# Patient Record
Sex: Female | Born: 1975
Health system: Southern US, Community
[De-identification: ages and names within clinical notes are randomized; demographics above are authoritative.]

## PROBLEM LIST (undated history)

## (undated) DIAGNOSIS — G43101 Migraine with aura, not intractable, with status migrainosus: Secondary | ICD-10-CM

## (undated) DIAGNOSIS — D500 Iron deficiency anemia secondary to blood loss (chronic): Secondary | ICD-10-CM

## (undated) DIAGNOSIS — D259 Leiomyoma of uterus, unspecified: Secondary | ICD-10-CM

## (undated) DIAGNOSIS — Z973 Presence of spectacles and contact lenses: Secondary | ICD-10-CM

## (undated) DIAGNOSIS — J302 Other seasonal allergic rhinitis: Secondary | ICD-10-CM

## (undated) DIAGNOSIS — D649 Anemia, unspecified: Secondary | ICD-10-CM

## (undated) DIAGNOSIS — J3089 Other allergic rhinitis: Secondary | ICD-10-CM

## (undated) DIAGNOSIS — F32A Depression, unspecified: Secondary | ICD-10-CM

## (undated) DIAGNOSIS — R519 Headache, unspecified: Secondary | ICD-10-CM

## (undated) DIAGNOSIS — Z972 Presence of dental prosthetic device (complete) (partial): Secondary | ICD-10-CM

## (undated) DIAGNOSIS — F411 Generalized anxiety disorder: Secondary | ICD-10-CM

## (undated) DIAGNOSIS — Z8669 Personal history of other diseases of the nervous system and sense organs: Secondary | ICD-10-CM

## (undated) DIAGNOSIS — F419 Anxiety disorder, unspecified: Secondary | ICD-10-CM

## (undated) DIAGNOSIS — F329 Major depressive disorder, single episode, unspecified: Secondary | ICD-10-CM

## (undated) DIAGNOSIS — R9389 Abnormal findings on diagnostic imaging of other specified body structures: Secondary | ICD-10-CM

## (undated) DIAGNOSIS — R7303 Prediabetes: Secondary | ICD-10-CM

## (undated) DIAGNOSIS — N920 Excessive and frequent menstruation with regular cycle: Secondary | ICD-10-CM

## (undated) DIAGNOSIS — C50919 Malignant neoplasm of unspecified site of unspecified female breast: Secondary | ICD-10-CM

## (undated) HISTORY — DX: Malignant neoplasm of unspecified site of unspecified female breast: C50.919

## (undated) HISTORY — PX: TUBAL LIGATION: SHX77

---

## 2001-04-01 HISTORY — PX: TUBAL LIGATION: SHX77

## 2012-09-08 ENCOUNTER — Emergency Department (HOSPITAL_COMMUNITY)
Admission: EM | Admit: 2012-09-08 | Discharge: 2012-09-08 | Discharge status: Against Medical Advice | Payer: Medicaid Other | Attending: Emergency Medicine | Admitting: Emergency Medicine

## 2012-09-08 ENCOUNTER — Encounter (HOSPITAL_COMMUNITY): Payer: Self-pay | Admitting: Emergency Medicine

## 2012-09-08 DIAGNOSIS — Z5329 Procedure and treatment not carried out because of patient's decision for other reasons: Secondary | ICD-10-CM

## 2012-09-08 DIAGNOSIS — J029 Acute pharyngitis, unspecified: Secondary | ICD-10-CM | POA: Insufficient documentation

## 2012-09-08 DIAGNOSIS — R51 Headache: Secondary | ICD-10-CM | POA: Insufficient documentation

## 2012-09-08 NOTE — ED Notes (Signed)
Pt c/o sore throat and sinus drainage x 4 days

## 2012-09-08 NOTE — ED Provider Notes (Signed)
VSS stable in triage. Patient left before being seen or evaluated by myself.  Antony Madura, PA-C 09/08/12 1254

## 2012-09-08 NOTE — ED Provider Notes (Signed)
Medical screening examination/treatment/procedure(s) were performed by non-physician practitioner and as supervising physician I was immediately available for consultation/collaboration.   Carleene Cooper III, MD 09/08/12 [phone]

## 2012-09-08 NOTE — ED Notes (Signed)
Pt left before being seen by provider. Pt advised that she is leaving AMA.

## 2013-03-12 ENCOUNTER — Emergency Department (HOSPITAL_COMMUNITY): Payer: Medicaid Other

## 2013-03-12 ENCOUNTER — Emergency Department (HOSPITAL_COMMUNITY)
Admission: EM | Admit: 2013-03-12 | Discharge: 2013-03-12 | Disposition: A | Discharge status: Home / Self Care | Payer: Medicaid Other | Attending: Emergency Medicine | Admitting: Emergency Medicine

## 2013-03-12 DIAGNOSIS — K089 Disorder of teeth and supporting structures, unspecified: Secondary | ICD-10-CM | POA: Insufficient documentation

## 2013-03-12 DIAGNOSIS — K0889 Other specified disorders of teeth and supporting structures: Secondary | ICD-10-CM

## 2013-03-12 DIAGNOSIS — K0381 Cracked tooth: Secondary | ICD-10-CM | POA: Insufficient documentation

## 2013-03-12 DIAGNOSIS — R6883 Chills (without fever): Secondary | ICD-10-CM | POA: Insufficient documentation

## 2013-03-12 DIAGNOSIS — J019 Acute sinusitis, unspecified: Secondary | ICD-10-CM | POA: Insufficient documentation

## 2013-03-12 DIAGNOSIS — R220 Localized swelling, mass and lump, head: Secondary | ICD-10-CM | POA: Insufficient documentation

## 2013-03-12 DIAGNOSIS — K047 Periapical abscess without sinus: Secondary | ICD-10-CM

## 2013-03-12 IMAGING — CT CT MAXILLOFACIAL W/O CM
3 series · 16 of 47 positions shown, 19 images · non-contrast
Comparison: None.

CLINICAL DATA: Sinus congestion/pressure, toothache

EXAM:
CT MAXILLOFACIAL WITHOUT CONTRAST
TECHNIQUE: Multidetector CT imaging of the maxillofacial structures was
performed. Multiplanar CT image reconstructions were also generated.
A small metallic BB was placed on the right temple in order to
reliably differentiate right from left.

[Series 3: facial/ orbits 2.0 h30s · axial · 0.36mm/px · z∈[-155,-23]mm · 10 of 78 slices shown, 13 images]
[im 6/78  brain]
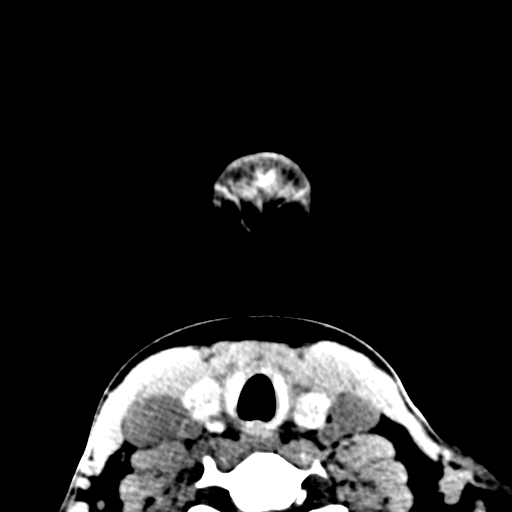
[im 6/78  bone]
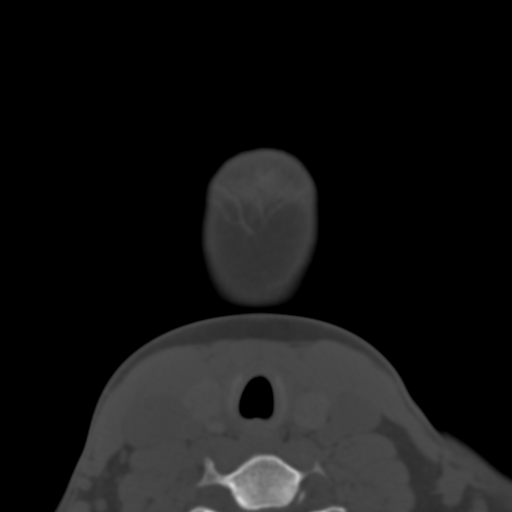
[im 14/78  bone]
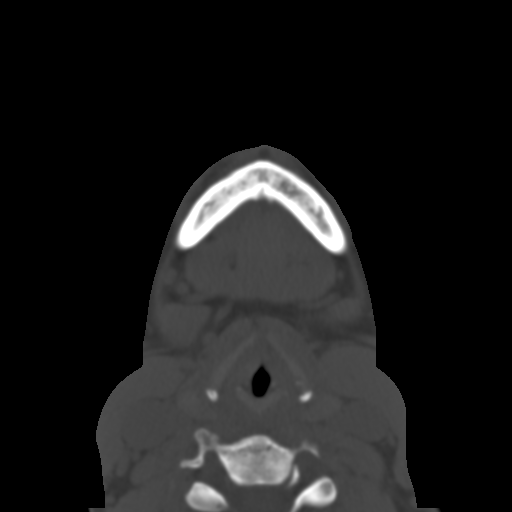
[im 22/78  bone]
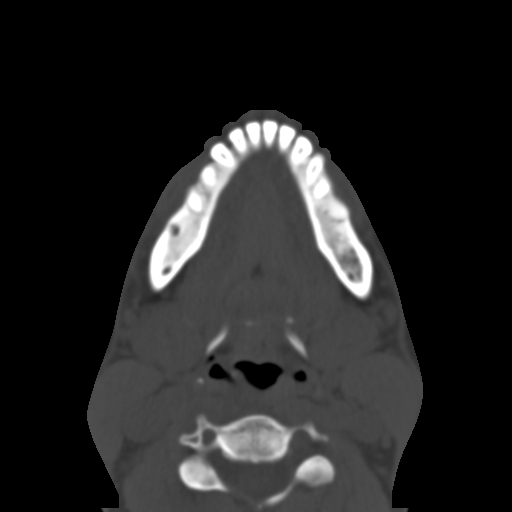
[im 27/78  bone]
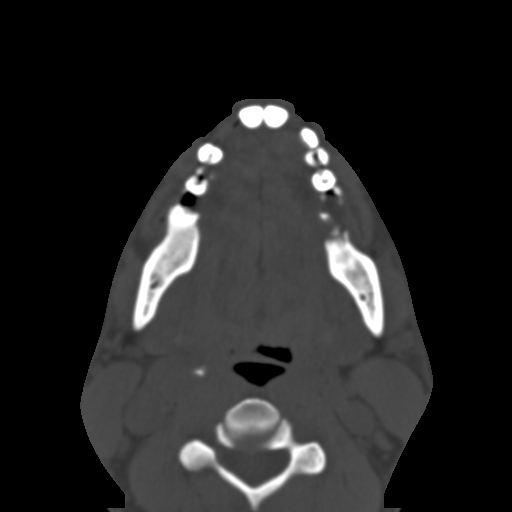
[im 35/78  brain]
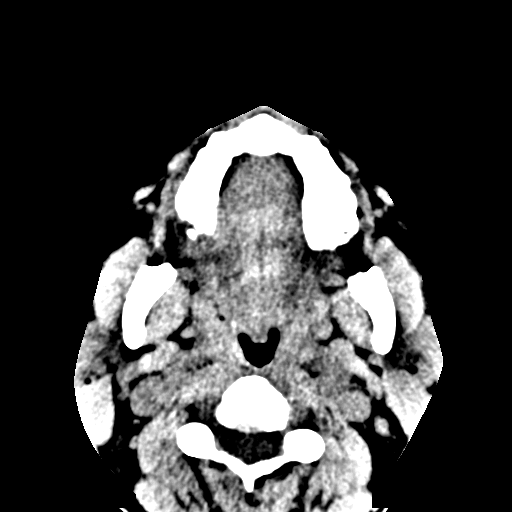
[im 35/78  bone]
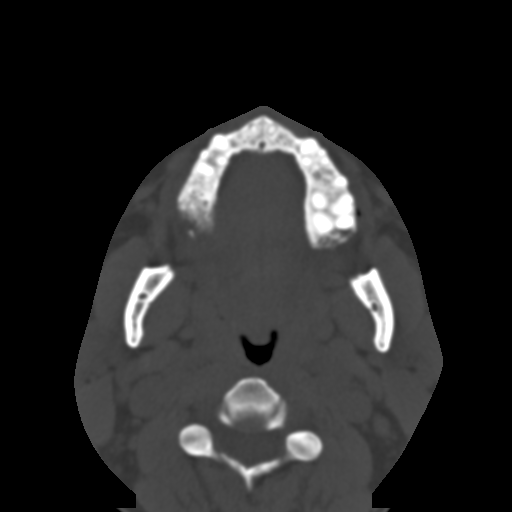
[im 43/78  bone]
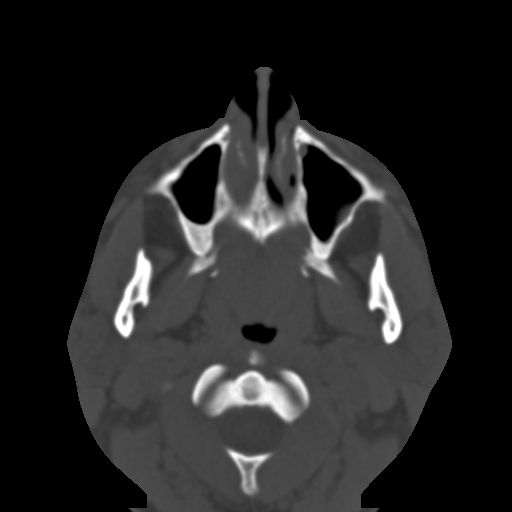
[im 51/78  bone]
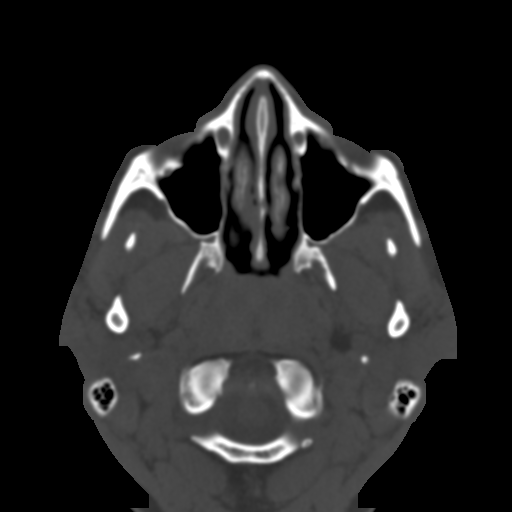
[im 59/78  bone]
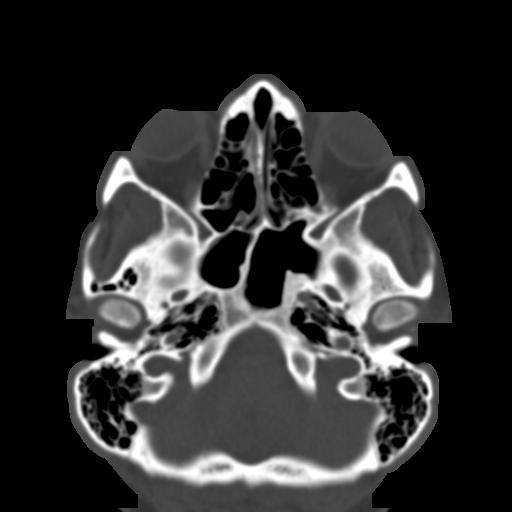
[im 64/78  brain]
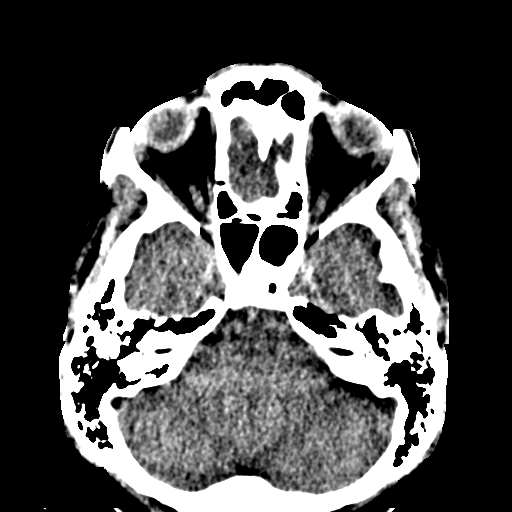
[im 64/78  bone]
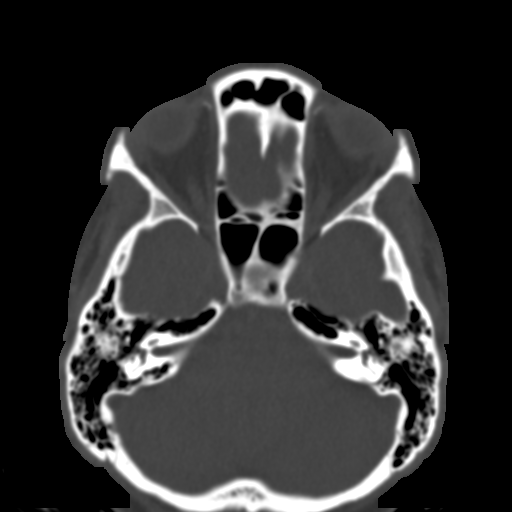
[im 72/78  bone]
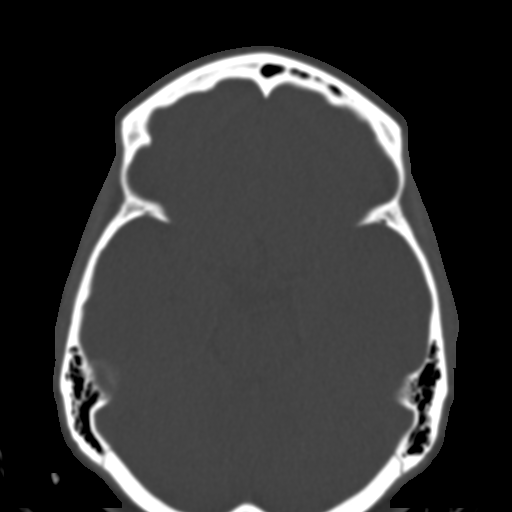

[Series 602: st cor · coronal · 0.36mm/px · 3 of 81 slices shown]
[im 27/81  bone]
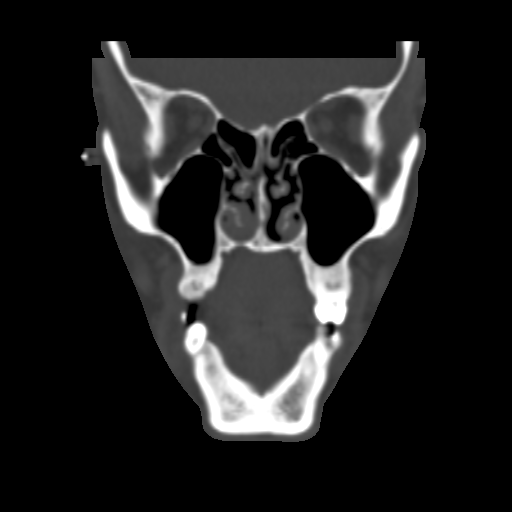
[im 36/81  bone]
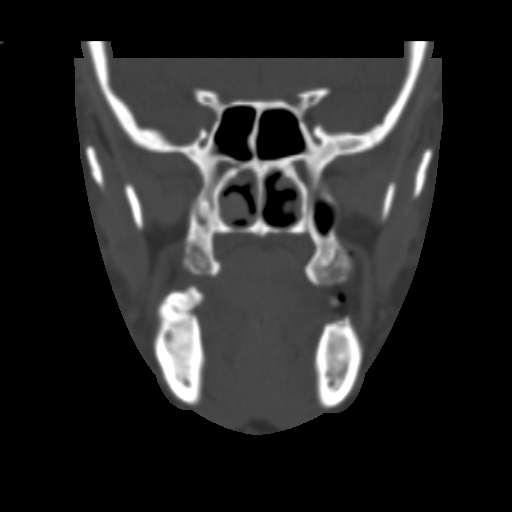
[im 45/81  bone]
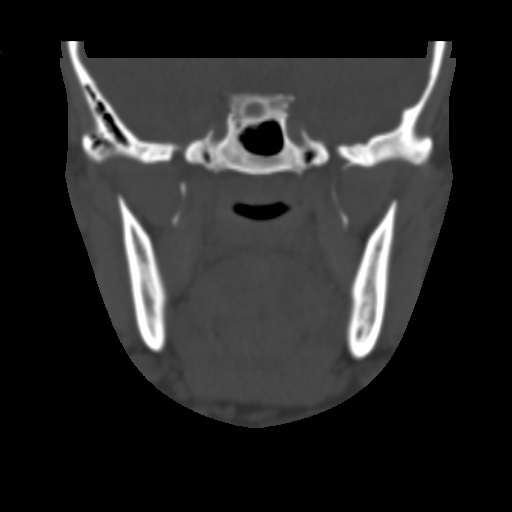

[Series 603: st sag · sagittal · 0.36mm/px · 3 of 81 slices shown]
[im 27/81  bone]
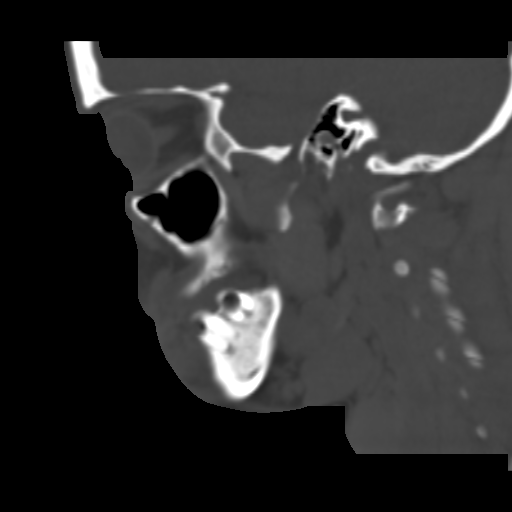
[im 41/81  bone]
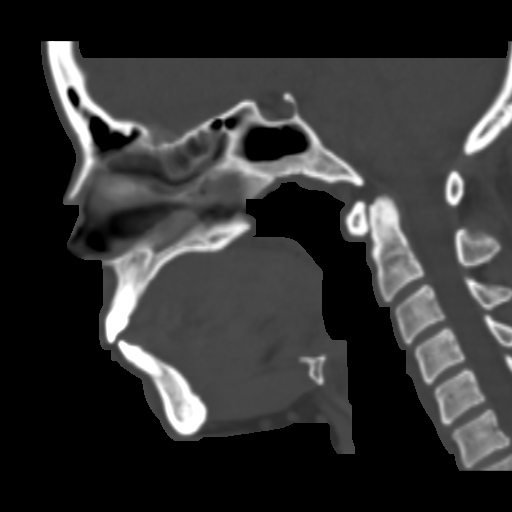
[im 54/81  bone]
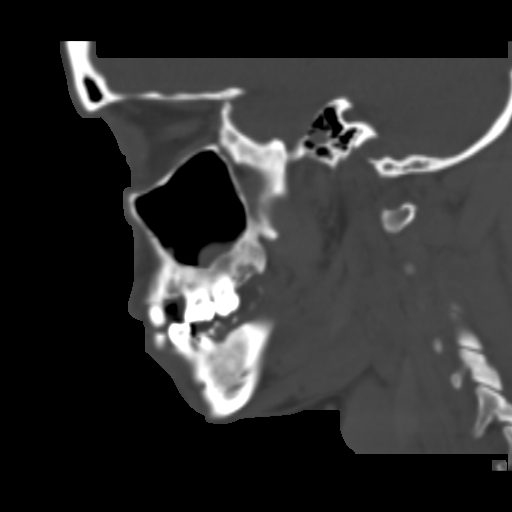

[16 of 47 positions shown; findings below may reference images not displayed]

FINDINGS: Poor dentition.

Multiple prior tooth extractions, including involving the upper
molars bilaterally (series 4/image 37). Numerous dental caries,
including a dominant caries involving the right lower 2nd molar
(series 4/ image 31). Multiple apical lucencies, including involving
the right lower 1st bicuspid and right lower 2nd molar.

No evidence of odontogenic abscess.

Mild mucosal thickening in the left maxillary sinus. Visualized
paranasal sinuses and mastoid air cells are otherwise clear.

Bilateral orbits, including the globes and retroconal soft tissues,
are within normal limits.

Multiple small bilateral cervical lymph nodes measuring up to 8 mm
short axis (series 3/ image 31), likely reactive.

Visualized brain parenchyma is unremarkable.

Visualized cervical spine is normal to C6-7.
IMPRESSION: No evidence of odontogenic abscess.

Poor dentition with multiple prior extractions, numerous dental
caries, and multiple apical lucencies, as described above. Dominant
caries involves the right lower 2nd molar.

## 2013-03-12 MED ORDER — AMOXICILLIN-POT CLAVULANATE 875-125 MG PO TABS: 1.0000 | ORAL_TABLET | Freq: Two times a day (BID) | ORAL | Status: DC

## 2013-03-12 MED ORDER — KETOROLAC TROMETHAMINE 60 MG/2ML IM SOLN
60.0000 mg | Freq: Once | INTRAMUSCULAR | Status: AC
  Administered 2013-03-12: 60 mg via INTRAMUSCULAR
  Filled 2013-03-12: qty 2

## 2013-03-12 MED ORDER — OXYCODONE-ACETAMINOPHEN 5-325 MG PO TABS: 1.0000 | ORAL_TABLET | Freq: Three times a day (TID) | ORAL | Status: DC | PRN

## 2013-03-12 NOTE — ED Provider Notes (Signed)
CSN: [id number]     Arrival date & time 03/12/13  1512 History  This chart was scribed for non-physician practitioner, Raymon Mutton, PA-C,working with Flint Melter, MD, by Karle Plumber, ED Scribe.  This patient was seen in room TR08C/TR08C and the patient's care was started at 4:36 PM.  Chief Complaint  Patient presents with  . URI   The history is provided by the patient. No language interpreter was used.   HPI Comments:  Connie Underwood is a 37 y.o. female who presents to the Emergency Department complaining of sinus congestion and pressure for approximately 5 days. Pt reports an associated tooth ache onset last night. Pt states the sinus congestion and pressure are predominately on the right side. She reports a postnasal drip that is causing a sore throat. She states her right upper molar started hurting last night secondary to cracking the tooth previously. She states she needs an extraction but cannot be seen by her dentist for two weeks. She reports last seeing a dentist about 7 months ago and having several top teeth extracted. She denies drainage from or around the tooth. She states she has been taking Ibuprofen and Tylenol Sinus for her symptoms. She denies epistaxis, fever, chills, chest pain, SOB, diffulty breathing, cough, neck pain or stiffness, or difficulty swallowing.   No past medical history on file. No past surgical history on file. No family history on file. History  Substance Use Topics  . Smoking status: Never Smoker   . Smokeless tobacco: Not on file  . Alcohol Use: No   OB History   Grav Para Term Preterm Abortions TAB SAB Ect Mult Living                 Review of Systems  Constitutional: Positive for chills. Negative for fever.  HENT: Positive for congestion, dental problem, postnasal drip, sinus pressure and sore throat. Negative for facial swelling, mouth sores, nosebleeds and trouble swallowing.   Respiratory: Negative for cough and shortness of  breath.   Cardiovascular: Negative for chest pain.  Musculoskeletal: Negative for neck pain and neck stiffness.  All other systems reviewed and are negative.    Allergies  Review of patient's allergies indicates no known allergies.  Home Medications   Current Outpatient Rx  Name  Route  Sig  Dispense  Refill  . ibuprofen (ADVIL,MOTRIN) 200 MG tablet   Oral   Take 400 mg by mouth every 6 (six) hours as needed for fever, headache or mild pain.         . pseudoephedrine-acetaminophen (TYLENOL SINUS) 30-500 MG TABS   Oral   Take 2 tablets by mouth every 4 (four) hours as needed.         Marland Kitchen amoxicillin-clavulanate (AUGMENTIN) 875-125 MG per tablet   Oral   Take 1 tablet by mouth 2 (two) times daily. One po bid x 7 days   14 tablet   0   . oxyCODONE-acetaminophen (PERCOCET/ROXICET) 5-325 MG per tablet   Oral   Take 1 tablet by mouth every 8 (eight) hours as needed for severe pain.   11 tablet   0    Triage Vitals: BP 113/50  Pulse 78  Temp(Src) 98.8 F (37.1 C) (Oral)  Resp 18  SpO2 100% Physical Exam  Nursing note and vitals reviewed. Constitutional: She is oriented to person, place, and time. She appears well-developed and well-nourished.  HENT:  Head: Normocephalic and atraumatic.  Right Ear: External ear normal.  Left Ear: External ear  normal.  Mouth/Throat: Oropharynx is clear and moist. No oropharyngeal exudate.  Mild facial swelling localized to the right side of the face, maxillary region with discomfort upon palpation to the right maxillary and mandibular region. Negative post-nasal drip. Negative swelling, erythema, inflammation, lesions, sores, exudate, petechiae noted to the posterior oropharynx and bilateral tonsils.  Poor dentition noted with first, second and third premolars diagrammed with decaying processes beginning. Swelling to the gumline noted with negative active drainage or bleeding. Negative trismus. Negative sublingual lesions. Uvula midline,  symmetrical elevation.   Eyes: Conjunctivae and EOM are normal. Pupils are equal, round, and reactive to light. Right eye exhibits no discharge. Left eye exhibits no discharge.  Neck: Normal range of motion. Neck supple.  Negative neck stiffness Negative nuchal rigidity Negative cervical LAD Negative meningeal signs  Cardiovascular: Normal rate, regular rhythm and normal heart sounds.  Exam reveals no friction rub.   No murmur heard. Pulses:      Radial pulses are 2+ on the right side, and 2+ on the left side.  Pulmonary/Chest: Effort normal and breath sounds normal. No respiratory distress. She has no wheezes. She has no rales.  Musculoskeletal: Normal range of motion.  Lymphadenopathy:    She has no cervical adenopathy.  Neurological: She is alert and oriented to person, place, and time. She exhibits normal muscle tone. Coordination normal.  Skin: Skin is warm and dry. No rash noted. No erythema.  Psychiatric: She has a normal mood and affect. Her behavior is normal.    ED Course  Procedures (including critical care time) DIAGNOSTIC STUDIES: Oxygen Saturation is 100% on RA, normal by my interpretation.   COORDINATION OF CARE: 4:43 PM- Will perform a CT of the head. Pt verbalizes understanding and agrees to plan.  Medications  ketorolac (TORADOL) injection 60 mg (not administered)    Ct Maxillofacial Wo Cm  03/12/2013   CLINICAL DATA:  Sinus congestion/pressure, toothache  EXAM: CT MAXILLOFACIAL WITHOUT CONTRAST  TECHNIQUE: Multidetector CT imaging of the maxillofacial structures was performed. Multiplanar CT image reconstructions were also generated. A small metallic BB was placed on the right temple in order to reliably differentiate right from left.  COMPARISON:  None.  FINDINGS: Poor dentition.  Multiple prior tooth extractions, including involving the upper molars bilaterally (series 4/image 37). Numerous dental caries, including a dominant caries involving the right lower 2nd  molar (series 4/ image 31). Multiple apical lucencies, including involving the right lower 1st bicuspid and right lower 2nd molar.  No evidence of odontogenic abscess.  Mild mucosal thickening in the left maxillary sinus. Visualized paranasal sinuses and mastoid air cells are otherwise clear.  Bilateral orbits, including the globes and retroconal soft tissues, are within normal limits.  Multiple small bilateral cervical lymph nodes measuring up to 8 mm short axis (series 3/ image 31), likely reactive.  Visualized brain parenchyma is unremarkable.  Visualized cervical spine is normal to C6-7.  IMPRESSION: No evidence of odontogenic abscess.  Poor dentition with multiple prior extractions, numerous dental caries, and multiple apical lucencies, as described above. Dominant caries involves the right lower 2nd molar.   Electronically Signed   By: Charline Bills M.D.   On: 03/12/2013 18:28   Labs Review Labs Reviewed - No data to display Imaging Review Ct Maxillofacial Wo Cm  03/12/2013   CLINICAL DATA:  Sinus congestion/pressure, toothache  EXAM: CT MAXILLOFACIAL WITHOUT CONTRAST  TECHNIQUE: Multidetector CT imaging of the maxillofacial structures was performed. Multiplanar CT image reconstructions were also generated. A small  metallic BB was placed on the right temple in order to reliably differentiate right from left.  COMPARISON:  None.  FINDINGS: Poor dentition.  Multiple prior tooth extractions, including involving the upper molars bilaterally (series 4/image 37). Numerous dental caries, including a dominant caries involving the right lower 2nd molar (series 4/ image 31). Multiple apical lucencies, including involving the right lower 1st bicuspid and right lower 2nd molar.  No evidence of odontogenic abscess.  Mild mucosal thickening in the left maxillary sinus. Visualized paranasal sinuses and mastoid air cells are otherwise clear.  Bilateral orbits, including the globes and retroconal soft tissues, are  within normal limits.  Multiple small bilateral cervical lymph nodes measuring up to 8 mm short axis (series 3/ image 31), likely reactive.  Visualized brain parenchyma is unremarkable.  Visualized cervical spine is normal to C6-7.  IMPRESSION: No evidence of odontogenic abscess.  Poor dentition with multiple prior extractions, numerous dental caries, and multiple apical lucencies, as described above. Dominant caries involves the right lower 2nd molar.   Electronically Signed   By: Charline Bills M.D.   On: 03/12/2013 18:28    EKG Interpretation   None       MDM   1. Acute sinusitis   2. Pain, dental   3. Periapical abscess    Filed Vitals:   03/12/13 1521  BP: 113/50  Pulse: 78  Temp: 98.8 F (37.1 C)  TempSrc: Oral  Resp: 18  SpO2: 100%    I personally performed the services described in this documentation, which was scribed in my presence. The recorded information has been reviewed and is accurate.  Patient presenting to emergency department with nasal congestion, sinus pressure for the past 5 days with right mandibular dental pain that has been ongoing since yesterday. Patient reports that the last time she was seen by a dentist was 7 months ago. Alert and oriented. GCS 15. Heart rate and rhythm normal. Pulses palpable and strong, radial 2+ bilaterally. Lungs clear to auscultation to upper and lower lobes bilaterally. Mild swelling localized to the right side of the maxillary and mandibular region. Discomfort upon palpation to bilateral maxillary regions. Poor dentition identified with numerous teeth to the upper and lower gumline with diagrammed teeth, in decaying process. Discomfort upon palpation. First, second, third premolars of the right mandibular region identified to be diagrammed, decaying process noted- gum line swelling noted without signs of drainage or bleeding. Negative trismus. Uvula midline, symmetrical elevation. Negative sublingual lesions identified. CT  maxillofacial identified mild mucosal thickening in the left maxillary sinus-visualized paranasal sinuses and mastoid air cells are otherwise clear. Poor dentition identified with numerous dental cavities and multiple apical lucencies.  Doubt peritonsillar abscess. Doubt Ludwig's angina. Suspicion of acute sinusitis. Possible beginnings of periapical abscesses secondary to multiple lucency identified on CT scan. Patient stable, afebrile. Patient nonseptic appearing. Will discharge patient with antibiotics and small dose of pain medications. Discussed course, cautions, disposal technique regarding pain medications. Referred patient to [facility] wellness center, dentist and oral surgeon. Dental referral guide given in ED discharge paperwork. Discussed with patient to rest and stay hydrated. Discussed with patient to closely monitor symptoms and if symptoms are to worsen or change report back to emergency apartment - strict return structures given. Patient agreed to plan of care, understood, all questions answered.  Raymon Mutton, PA-C 03/13/13 [phone]

## 2013-03-12 NOTE — ED Notes (Signed)
Pt c/o URI sx with congestion x 5 days

## 2013-03-14 NOTE — ED Provider Notes (Signed)
Medical screening examination/treatment/procedure(s) were performed by non-physician practitioner and as supervising physician I was immediately available for consultation/collaboration.  Mariyah Upshaw L Levii Hairfield, MD 03/14/13 0106 

## 2013-03-22 ENCOUNTER — Encounter (HOSPITAL_COMMUNITY): Payer: Self-pay | Admitting: Emergency Medicine

## 2013-03-22 ENCOUNTER — Emergency Department (HOSPITAL_COMMUNITY)
Admission: EM | Admit: 2013-03-22 | Discharge: 2013-03-22 | Disposition: A | Discharge status: Home / Self Care | Payer: Medicaid Other | Attending: Emergency Medicine | Admitting: Emergency Medicine

## 2013-03-22 DIAGNOSIS — Z202 Contact with and (suspected) exposure to infections with a predominantly sexual mode of transmission: Secondary | ICD-10-CM | POA: Insufficient documentation

## 2013-03-22 DIAGNOSIS — Z3202 Encounter for pregnancy test, result negative: Secondary | ICD-10-CM | POA: Insufficient documentation

## 2013-03-22 DIAGNOSIS — Z8709 Personal history of other diseases of the respiratory system: Secondary | ICD-10-CM | POA: Insufficient documentation

## 2013-03-22 LAB — WET PREP, GENITAL

## 2013-03-22 LAB — URINALYSIS, ROUTINE W REFLEX MICROSCOPIC
Bilirubin Urine: NEGATIVE
Glucose, UA: NEGATIVE mg/dL
Hgb urine dipstick: NEGATIVE
Nitrite: NEGATIVE
Protein, ur: NEGATIVE mg/dL
Urobilinogen, UA: 0.2 mg/dL (ref 0.0–1.0)

## 2013-03-22 LAB — POCT PREGNANCY, URINE: Preg Test, Ur: NEGATIVE

## 2013-03-22 MED ORDER — LIDOCAINE HCL (PF) 1 % IJ SOLN
INTRAMUSCULAR | Status: AC
  Administered 2013-03-22: 0.9 mL
  Filled 2013-03-22: qty 5

## 2013-03-22 MED ORDER — FLUCONAZOLE 150 MG PO TABS: 150.0000 mg | ORAL_TABLET | Freq: Once | ORAL | Status: DC

## 2013-03-22 MED ORDER — CEFTRIAXONE SODIUM 250 MG IJ SOLR
250.0000 mg | Freq: Once | INTRAMUSCULAR | Status: AC
  Administered 2013-03-22: 250 mg via INTRAMUSCULAR
  Filled 2013-03-22: qty 250

## 2013-03-22 MED ORDER — AZITHROMYCIN 250 MG PO TABS
1000.0000 mg | ORAL_TABLET | Freq: Once | ORAL | Status: AC
  Administered 2013-03-22: 1000 mg via ORAL
  Filled 2013-03-22: qty 4

## 2013-03-22 NOTE — ED Provider Notes (Signed)
CSN: [id number]     Arrival date & time 03/22/13  1632 History   First MD Initiated Contact with Patient 03/22/13 2003     Chief Complaint  Patient presents with  . Exposure to STD   (Consider location/radiation/quality/duration/timing/severity/associated sxs/prior Treatment) HPI  37 year old female presents for evaluations of possible STD. Patient states her boyfriend has cheated on her and the female that he has sexual relationship called her today to notify that she contracted STD.  Therefore, she would like to be checked.  She report having a sinus infection that was treated with Augmentin last week. Her sinus infection has improved but she thinks she may have developed a yeast infection due to having mild vaginal irritation. Or she denies having any fever, chills, dysuria, hematuria vaginal discharge, rash. She denies any prior history of STD. Her last menstrual period was December 13th.    History reviewed. No pertinent past medical history. History reviewed. No pertinent past surgical history. No family history on file. History  Substance Use Topics  . Smoking status: Never Smoker   . Smokeless tobacco: Not on file  . Alcohol Use: No   OB History   Grav Para Term Preterm Abortions TAB SAB Ect Mult Living                 Review of Systems  Constitutional: Negative for fever.  Gastrointestinal: Negative for abdominal pain.  Genitourinary: Negative for dysuria, hematuria, flank pain, vaginal discharge, genital sores, vaginal pain and pelvic pain.  Skin: Negative for rash.  Neurological: Negative for headaches.    Allergies  Review of patient's allergies indicates no known allergies.  Home Medications   Current Outpatient Rx  Name  Route  Sig  Dispense  Refill  . ibuprofen (ADVIL,MOTRIN) 200 MG tablet   Oral   Take 400 mg by mouth every 6 (six) hours as needed for fever, headache or mild pain.         Marland Kitchen oxyCODONE-acetaminophen (PERCOCET/ROXICET) 5-325 MG per  tablet   Oral   Take 1 tablet by mouth every 8 (eight) hours as needed for severe pain.   11 tablet   0   . pseudoephedrine-acetaminophen (TYLENOL SINUS) 30-500 MG TABS   Oral   Take 2 tablets by mouth every 4 (four) hours as needed.          BP 103/65  Pulse 65  Temp(Src) 99.3 F (37.4 C) (Oral)  Resp 20  SpO2 100%  LMP 03/13/2013 Physical Exam  Nursing note and vitals reviewed. Constitutional: She appears well-developed and well-nourished. No distress.  HENT:  Head: Normocephalic and atraumatic.  Eyes: Conjunctivae are normal.  Neck: Normal range of motion. Neck supple.  Cardiovascular: Normal rate and regular rhythm.   Pulmonary/Chest: Effort normal and breath sounds normal. She exhibits no tenderness.  Abdominal: Soft. There is no tenderness.  Genitourinary: Vagina normal and uterus normal. There is no rash or lesion on the right labia. There is no rash or lesion on the left labia. Cervix exhibits no motion tenderness and no discharge. Right adnexum displays no mass and no tenderness. Left adnexum displays no mass and no tenderness. No erythema, tenderness or bleeding around the vagina. No vaginal discharge found.  Chaperone present:  Lymphadenopathy:       Right: No inguinal adenopathy present.       Left: No inguinal adenopathy present.    ED Course  Procedures (including critical care time)  8:31 PM Pt concerns of STD.  She has no  specific complaint.  Preg test neg.  UA without UTI.    9:27 PM No discomfort with pelvic exam.  No other concerning finding on wet prep.  Pt stable for discharge.    Labs Review Labs Reviewed  WET PREP, GENITAL - Abnormal; Notable for the following:    Clue Cells Wet Prep HPF POC FEW (*)    WBC, Wet Prep HPF POC FEW (*)    All other components within normal limits  URINALYSIS, ROUTINE W REFLEX MICROSCOPIC - Abnormal; Notable for the following:    APPearance HAZY (*)    Specific Gravity, Urine 1.033 (*)    All other components  within normal limits  GC/CHLAMYDIA PROBE AMP  POCT PREGNANCY, URINE   Imaging Review No results found.  EKG Interpretation   None       MDM   1. Exposure to STD    BP 103/65  Pulse 65  Temp(Src) 99.3 F (37.4 C) (Oral)  Resp 20  SpO2 100%  LMP 03/13/2013     Fayrene Helper, PA-C 03/22/13 2128

## 2013-03-22 NOTE — ED Notes (Signed)
PA at beside.

## 2013-03-22 NOTE — ED Provider Notes (Signed)
Medical screening examination/treatment/procedure(s) were performed by non-physician practitioner and as supervising physician I was immediately available for consultation/collaboration.  Shanna Cisco, MD 03/22/13 2223

## 2013-03-22 NOTE — ED Notes (Signed)
Pt states that she was told she could have been to an STD (trich).  Pt has some vaginal irritation but is on antibiotics for sinus infection

## 2013-09-27 ENCOUNTER — Encounter (HOSPITAL_COMMUNITY): Payer: Self-pay | Admitting: Emergency Medicine

## 2013-09-27 ENCOUNTER — Emergency Department (HOSPITAL_COMMUNITY)
Admission: EM | Admit: 2013-09-27 | Discharge: 2013-09-28 | Disposition: A | Discharge status: Home / Self Care | Payer: Medicaid Other | Attending: Emergency Medicine | Admitting: Emergency Medicine

## 2013-09-27 DIAGNOSIS — A599 Trichomoniasis, unspecified: Secondary | ICD-10-CM | POA: Insufficient documentation

## 2013-09-27 DIAGNOSIS — M7989 Other specified soft tissue disorders: Secondary | ICD-10-CM

## 2013-09-27 DIAGNOSIS — R5381 Other malaise: Secondary | ICD-10-CM | POA: Insufficient documentation

## 2013-09-27 DIAGNOSIS — D649 Anemia, unspecified: Secondary | ICD-10-CM

## 2013-09-27 DIAGNOSIS — R5383 Other fatigue: Secondary | ICD-10-CM

## 2013-09-27 LAB — CBC WITH DIFFERENTIAL/PLATELET
Basophils Absolute: 0 10*3/uL (ref 0.0–0.1)
Basophils Relative: 0 % (ref 0–1)
Eosinophils Absolute: 0.4 10*3/uL (ref 0.0–0.7)
Eosinophils Relative: 4 % (ref 0–5)
HEMATOCRIT: 28.8 % — AB (ref 36.0–46.0)
HEMOGLOBIN: 8.8 g/dL — AB (ref 12.0–15.0)
LYMPHS ABS: 2.1 10*3/uL (ref 0.7–4.0)
Lymphocytes Relative: 25 % (ref 12–46)
MCH: 23.1 pg — ABNORMAL LOW (ref 26.0–34.0)
MCHC: 30.6 g/dL (ref 30.0–36.0)
MCV: 75.6 fL — ABNORMAL LOW (ref 78.0–100.0)
MONO ABS: 0.6 10*3/uL (ref 0.1–1.0)
Monocytes Relative: 7 % (ref 3–12)
NEUTROS ABS: 5.5 10*3/uL (ref 1.7–7.7)
NEUTROS PCT: 64 % (ref 43–77)
Platelets: 419 10*3/uL — ABNORMAL HIGH (ref 150–400)
RBC: 3.81 MIL/uL — ABNORMAL LOW (ref 3.87–5.11)
RDW: 17.8 % — ABNORMAL HIGH (ref 11.5–15.5)
WBC: 8.7 10*3/uL (ref 4.0–10.5)

## 2013-09-27 LAB — BASIC METABOLIC PANEL
BUN: 8 mg/dL (ref 6–23)
CHLORIDE: 101 meq/L (ref 96–112)
CO2: 22 meq/L (ref 19–32)
CREATININE: 0.69 mg/dL (ref 0.50–1.10)
Calcium: 8.9 mg/dL (ref 8.4–10.5)
GFR calc Af Amer: >90 mL/min (ref >90)
GFR calc non Af Amer: >90 mL/min (ref >90)
GLUCOSE: 93 mg/dL (ref 70–99)
POTASSIUM: 3.5 meq/L — AB (ref 3.7–5.3)
Sodium: 139 mEq/L (ref 137–147)

## 2013-09-27 LAB — URINE MICROSCOPIC-ADD ON

## 2013-09-27 LAB — URINALYSIS, ROUTINE W REFLEX MICROSCOPIC
Bilirubin Urine: NEGATIVE
Glucose, UA: NEGATIVE mg/dL
HGB URINE DIPSTICK: NEGATIVE
Ketones, ur: NEGATIVE mg/dL
NITRITE: NEGATIVE
PROTEIN: NEGATIVE mg/dL
Specific Gravity, Urine: 1.022 (ref 1.005–1.030)
Urobilinogen, UA: 1 mg/dL (ref 0.0–1.0)
pH: 6.5 (ref 5.0–8.0)

## 2013-09-27 LAB — HEPATIC FUNCTION PANEL
ALBUMIN: 3.3 g/dL — AB (ref 3.5–5.2)
ALK PHOS: 53 U/L (ref 39–117)
ALT: 8 U/L (ref 0–35)
AST: 16 U/L (ref 0–37)
BILIRUBIN TOTAL: 0.3 mg/dL (ref 0.3–1.2)
Total Protein: 6.9 g/dL (ref 6.0–8.3)

## 2013-09-27 LAB — POC URINE PREG, ED: PREG TEST UR: NEGATIVE

## 2013-09-27 LAB — D-DIMER, QUANTITATIVE: D-Dimer, Quant: 0.89 ug/mL-FEU — ABNORMAL HIGH (ref 0.00–0.48)

## 2013-09-27 NOTE — ED Notes (Signed)
Patient presents with her feet swelling.  Right 3-4+ pitting edema and 2+ pitting edema in the left foot

## 2013-09-27 NOTE — ED Provider Notes (Signed)
CSN: [id number]     Arrival date & time 09/27/13  1952 History   First MD Initiated Contact with Patient 09/27/13 2138     Chief Complaint  Patient presents with  . Foot Swelling     (Consider location/radiation/quality/duration/timing/severity/associated sxs/prior Treatment) HPI Comments: Patient is an otherwise healthy 38 year old female who presents today for one week of gradually worsening right leg swelling. She states that it is not painful, but continues to swell. She reports that her left leg has some swelling, but nothing like her right leg. She sits at her desk for 8 hours a day. No long trips, recent surgeries. She is not on any exogenous estrogen. No numbness, weakness, chest pain, shortness of breath. She has never had this in the past. She reports having some generalized fatigue, but states she feels like this is due to increased stress.   The history is provided by the patient. No language interpreter was used.    History reviewed. No pertinent past medical history. History reviewed. No pertinent past surgical history. History reviewed. No pertinent family history. History  Substance Use Topics  . Smoking status: Never Smoker   . Smokeless tobacco: Never Used  . Alcohol Use: No   OB History   Grav Para Term Preterm Abortions TAB SAB Ect Mult Living                 Review of Systems  Constitutional: Negative for fever and chills.  Respiratory: Negative for cough and shortness of breath.   Cardiovascular: Positive for leg swelling. Negative for chest pain and palpitations.  Gastrointestinal: Negative for nausea, vomiting and abdominal pain.  Musculoskeletal: Negative for arthralgias and myalgias.  All other systems reviewed and are negative.     Allergies  Tomato  Home Medications   Prior to Admission medications   Not on File   BP 117/45  Pulse 80  Temp(Src) 99.7 F (37.6 C) (Oral)  Resp 16  Ht 5' 4.5" (1.638 m)  Wt 223 lb (101.152 kg)  BMI 37.70  kg/m2  SpO2 98% Physical Exam  Nursing note and vitals reviewed. Constitutional: She is oriented to person, place, and time. She appears well-developed and well-nourished. No distress.  HENT:  Head: Normocephalic and atraumatic.  Right Ear: External ear normal.  Left Ear: External ear normal.  Nose: Nose normal.  Mouth/Throat: Oropharynx is clear and moist.  Eyes: Conjunctivae are normal.  Neck: Normal range of motion.  Cardiovascular: Normal rate, regular rhythm, normal heart sounds, intact distal pulses and normal pulses.   Pulses:      Posterior tibial pulses are 2+ on the right side, and 2+ on the left side.  Mild edema to right foot and lower leg. Edema is not pitting. Capillary refill < 3 seconds in all toes. No tenderness to palpation in lower extremities bilaterally.   Pulmonary/Chest: Effort normal and breath sounds normal. No stridor. No respiratory distress. She has no wheezes. She has no rales.  Abdominal: Soft. She exhibits no distension. There is no tenderness.  Musculoskeletal: Normal range of motion.  Neurological: She is alert and oriented to person, place, and time. She has normal strength.  Skin: Skin is warm and dry. She is not diaphoretic. No erythema.  Psychiatric: She has a normal mood and affect. Her behavior is normal.    ED Course  Procedures (including critical care time) Labs Review Labs Reviewed  CBC WITH DIFFERENTIAL - Abnormal; Notable for the following:    RBC 3.81 (*)  Hemoglobin 8.8 (*)    HCT 28.8 (*)    MCV 75.6 (*)    MCH 23.1 (*)    RDW 17.8 (*)    Platelets 419 (*)    All other components within normal limits  BASIC METABOLIC PANEL - Abnormal; Notable for the following:    Potassium 3.5 (*)    All other components within normal limits  D-DIMER, QUANTITATIVE - Abnormal; Notable for the following:    D-Dimer, Quant 0.89 (*)    All other components within normal limits  HEPATIC FUNCTION PANEL - Abnormal; Notable for the following:     Albumin 3.3 (*)    All other components within normal limits  URINALYSIS, ROUTINE W REFLEX MICROSCOPIC - Abnormal; Notable for the following:    APPearance CLOUDY (*)    Leukocytes, UA MODERATE (*)    All other components within normal limits  URINE MICROSCOPIC-ADD ON - Abnormal; Notable for the following:    Squamous Epithelial / LPF FEW (*)    Bacteria, UA FEW (*)    All other components within normal limits  URINE CULTURE  TSH  POC URINE PREG, ED    Imaging Review No results found.   EKG Interpretation None      MDM   Final diagnoses:  Leg swelling  Anemia, unspecified anemia type  Infection due to trichomonas    Patient presents to ED for painless right leg swelling. No tenderness to palpation. No erythema, streaking, induration. No signs of infection. D dimer was done as patient is low risk for DVT. D dimer was elevated. Patient given Lovenox in ED and outpatient doppler of right leg was ordered. Patient will follow up for study tomorrow morning. Patient was also found to be anemic. She is asymptomatic. Encouraged patient to follow up with PCP about this. Patient's urine shows trichomonas. Discussed this with patient. She reports being asymptomatic. No vaginal discharge, abdominal pain. She was treated with 2,000mg  of Flagyl in ED. Azithromycin and rocephin also given for gc/chlamydia. encouraged patient to follow up with [facility] and/or PCP for full STD panel. HIV and RPR were offered to patient which she declined. Discussed reasons to return to ED immediately. Vital signs stable for discharge. Discussed case with Dr. Leonides Schanz who agrees with plan. Patient / Family / Caregiver informed of clinical course, understand medical decision-making process, and agree with plan.   Elwyn Lade, PA-C 09/28/13 1141

## 2013-09-28 ENCOUNTER — Ambulatory Visit (HOSPITAL_COMMUNITY)
Admission: RE | Admit: 2013-09-28 | Discharge: 2013-09-28 | Disposition: A | Discharge status: Home / Self Care | Payer: Medicaid Other | Source: Ambulatory Visit | Attending: Emergency Medicine | Admitting: Emergency Medicine

## 2013-09-28 DIAGNOSIS — M7989 Other specified soft tissue disorders: Secondary | ICD-10-CM

## 2013-09-28 LAB — URINE CULTURE
Colony Count: NO GROWTH
Culture: NO GROWTH

## 2013-09-28 LAB — TSH: TSH: 1.02 u[IU]/mL (ref 0.350–4.500)

## 2013-09-28 MED ORDER — LIDOCAINE HCL (PF) 1 % IJ SOLN
2.0000 mL | Freq: Once | INTRAMUSCULAR | Status: AC
  Administered 2013-09-28: 2 mL

## 2013-09-28 MED ORDER — METRONIDAZOLE 500 MG PO TABS
2000.0000 mg | ORAL_TABLET | Freq: Once | ORAL | Status: AC
  Administered 2013-09-28: 2000 mg via ORAL
  Filled 2013-09-28: qty 4

## 2013-09-28 MED ORDER — AZITHROMYCIN 250 MG PO TABS
1000.0000 mg | ORAL_TABLET | Freq: Once | ORAL | Status: AC
  Administered 2013-09-28: 1000 mg via ORAL
  Filled 2013-09-28: qty 4

## 2013-09-28 MED ORDER — ENOXAPARIN SODIUM 100 MG/ML ~~LOC~~ SOLN
1.0000 mg/kg | Freq: Once | SUBCUTANEOUS | Status: AC
  Administered 2013-09-28: 100 mg via SUBCUTANEOUS
  Filled 2013-09-28: qty 1

## 2013-09-28 MED ORDER — CEFTRIAXONE SODIUM 250 MG IJ SOLR
250.0000 mg | Freq: Once | INTRAMUSCULAR | Status: AC
  Administered 2013-09-28: 250 mg via INTRAMUSCULAR
  Filled 2013-09-28: qty 250

## 2013-09-28 MED ORDER — LIDOCAINE HCL (PF) 1 % IJ SOLN
INTRAMUSCULAR | Status: AC
  Administered 2013-09-28: 2 mL
  Filled 2013-09-28: qty 5

## 2013-09-28 NOTE — ED Provider Notes (Signed)
Medical screening examination/treatment/procedure(s) were performed by non-physician practitioner and as supervising physician I was immediately available for consultation/collaboration.   EKG Interpretation None        Delice Bison Ward, DO 09/28/13 1526

## 2013-09-28 NOTE — Discharge Instructions (Signed)
Edema Edema is an abnormal buildup of fluids. It is more common in your legs and thighs. Painless swelling of the feet and ankles is more likely as a person ages. It also is common in looser skin, like around your eyes. HOME CARE   Keep the affected body part above the level of the heart while lying down.  Do not sit still or stand for a long time.  Do not put anything right under your knees when you lie down.  Do not wear tight clothes on your upper legs.  Exercise your legs to help the puffiness (swelling) go down.  Wear elastic bandages or support stockings as told by your doctor.  A low-salt diet may help lessen the puffiness.  Only take medicine as told by your doctor. GET HELP IF:  Treatment is not working.  You have heart, liver, or kidney disease and notice that your skin looks puffy or shiny.  You have puffiness in your legs that does not get better when you raise your legs.  You have sudden weight gain for no reason. GET HELP RIGHT AWAY IF:   You have shortness of breath or chest pain.  You cannot breathe when you lie down.  You have pain, redness, or warmth in the areas that are puffy.  You have heart, liver, or kidney disease and get edema all of a sudden.  You have a fever and your symptoms get worse all of a sudden. MAKE SURE YOU:   Understand these instructions.  Will watch your condition.  Will get help right away if you are not doing well or get worse. Document Released: 09/04/2007 Document Revised: 03/23/2013 Document Reviewed: 01/08/2013 [facility] Patient Information 2015 [place], [place]. This information is not intended to replace advice given to you by your health care provider. Make sure you discuss any questions you have with your health care provider.  Anemia, Nonspecific Anemia is a condition in which the concentration of red blood cells or hemoglobin in the blood is below normal. Hemoglobin is a substance in red blood cells that carries  oxygen to the tissues of the body. Anemia results in not enough oxygen reaching these tissues.  CAUSES  Common causes of anemia include:   Excessive bleeding. Bleeding may be internal or external. This includes excessive bleeding from periods (in women) or from the intestine.   Poor nutrition.   Chronic kidney, thyroid, and liver disease.  Bone marrow disorders that decrease red blood cell production.  Cancer and treatments for cancer.  HIV, AIDS, and their treatments.  Spleen problems that increase red blood cell destruction.  Blood disorders.  Excess destruction of red blood cells due to infection, medicines, and autoimmune disorders. SIGNS AND SYMPTOMS   Minor weakness.   Dizziness.   Headache.  Palpitations.   Shortness of breath, especially with exercise.   Paleness.  Cold sensitivity.  Indigestion.  Nausea.  Difficulty sleeping.  Difficulty concentrating. Symptoms may occur suddenly or they may develop slowly.  DIAGNOSIS  Additional blood tests are often needed. These help your health care provider determine the best treatment. Your health care provider will check your stool for blood and look for other causes of blood loss.  TREATMENT  Treatment varies depending on the cause of the anemia. Treatment can include:   Supplements of iron, vitamin G40, or folic acid.   Hormone medicines.   A blood transfusion. This may be needed if blood loss is severe.   Hospitalization. This may be needed if there  is significant continual blood loss.   Dietary changes.  Spleen removal. HOME CARE INSTRUCTIONS Keep all follow-up appointments. It often takes many weeks to correct anemia, and having your health care provider check on your condition and your response to treatment is very important. SEEK IMMEDIATE MEDICAL CARE IF:   You develop extreme weakness, shortness of breath, or chest pain.   You become dizzy or have trouble concentrating.  You  develop heavy vaginal bleeding.   You develop a rash.   You have bloody or black, tarry stools.   You faint.   You vomit up blood.   You vomit repeatedly.   You have abdominal pain.  You have a fever or persistent symptoms for more than 2-3 days.   You have a fever and your symptoms suddenly get worse.   You are dehydrated.  MAKE SURE YOU:  Understand these instructions.  Will watch your condition.  Will get help right away if you are not doing well or get worse. Document Released: 04/25/2004 Document Revised: 11/18/2012 Document Reviewed: 09/11/2012 [facility] Patient Information 2015 [place], [place]. This information is not intended to replace advice given to you by your health care provider. Make sure you discuss any questions you have with your health care provider.  Trichomoniasis Trichomoniasis is an infection caused by an organism called Trichomonas. The infection can affect both women and men. In women, the outer female genitalia and the vagina are affected. In men, the penis is mainly affected, but the prostate and other reproductive organs can also be involved. Trichomoniasis is a sexually transmitted infection (STI) and is most often passed to another person through sexual contact.  RISK FACTORS  Having unprotected sexual intercourse.  Having sexual intercourse with an infected partner. SIGNS AND SYMPTOMS  Symptoms of trichomoniasis in women include:  Abnormal gray-green frothy vaginal discharge.  Itching and irritation of the vagina.  Itching and irritation of the area outside the vagina. Symptoms of trichomoniasis in men include:   Penile discharge with or without pain.  Pain during urination. This results from inflammation of the urethra. DIAGNOSIS  Trichomoniasis may be found during a Pap test or physical exam. Your health care provider may use one of the following methods to help diagnose this infection:  Examining vaginal discharge under a  microscope. For men, urethral discharge would be examined.  Testing the pH of the vagina with a test tape.  Using a vaginal swab test that checks for the Trichomonas organism. A test is available that provides results within a few minutes.  Doing a culture test for the organism. This is not usually needed. TREATMENT   You may be given medicine to fight the infection. Women should inform their health care provider if they could be or are pregnant. Some medicines used to treat the infection should not be taken during pregnancy.  Your health care provider may recommend over-the-counter medicines or creams to decrease itching or irritation.  Your sexual partner will need to be treated if infected. HOME CARE INSTRUCTIONS   Take all medicine prescribed by your health care provider.  Take over-the-counter medicine for itching or irritation as directed by your health care provider.  Do not have sexual intercourse while you have the infection.  Women should not douche or wear tampons while they have the infection.  Discuss your infection with your partner. Your partner may have gotten the infection from you, or you may have gotten it from your partner.  Have your sex partner get examined and  treated if necessary.  Practice safe, informed, and protected sex.  See your health care provider for other STI testing. SEEK MEDICAL CARE IF:   You still have symptoms after you finish your medicine.  You develop abdominal pain.  You have pain when you urinate.  You have bleeding after sexual intercourse.  You develop a rash.  Your medicine makes you sick or makes you throw up (vomit). Document Released: 09/11/2000 Document Revised: 03/23/2013 Document Reviewed: 12/28/2012 [facility] Patient Information 2015 [place], [place]. This information is not intended to replace advice given to you by your health care provider. Make sure you discuss any questions you have with your health care  provider.

## 2013-09-28 NOTE — Progress Notes (Signed)
VASCULAR LAB PRELIMINARY  PRELIMINARY  PRELIMINARY  PRELIMINARY  Right lower extremity venous duplex completed.    Preliminary report:  Right leg is negative for deep and superficial vein thrombosis.   Rosalba Totty, RVT 09/28/2013, 9:27 AM

## 2014-04-05 ENCOUNTER — Encounter (HOSPITAL_COMMUNITY): Payer: Self-pay | Admitting: *Deleted

## 2014-04-05 ENCOUNTER — Emergency Department (HOSPITAL_COMMUNITY)
Admission: EM | Admit: 2014-04-05 | Discharge: 2014-04-05 | Disposition: A | Discharge status: Home / Self Care | Payer: Self-pay | Attending: Emergency Medicine | Admitting: Emergency Medicine

## 2014-04-05 DIAGNOSIS — J069 Acute upper respiratory infection, unspecified: Secondary | ICD-10-CM | POA: Insufficient documentation

## 2014-04-05 DIAGNOSIS — R0981 Nasal congestion: Secondary | ICD-10-CM

## 2014-04-05 LAB — RAPID STREP SCREEN (MED CTR MEBANE ONLY): Streptococcus, Group A Screen (Direct): NEGATIVE

## 2014-04-05 MED ORDER — IBUPROFEN 800 MG PO TABS
800.0000 mg | ORAL_TABLET | Freq: Once | ORAL | Status: AC
  Administered 2014-04-05: 800 mg via ORAL
  Filled 2014-04-05: qty 2

## 2014-04-05 MED ORDER — BUDESONIDE 32 MCG/ACT NA SUSP: 2.0000 | Freq: Every day | NASAL | Status: DC

## 2014-04-05 NOTE — ED Provider Notes (Signed)
CSN: [id number]     Arrival date & time 04/05/14  1525 History   First MD Initiated Contact with Patient 04/05/14 1539     Chief Complaint  Patient presents with  . Sore Throat  . Nasal Congestion     (Consider location/radiation/quality/duration/timing/severity/associated sxs/prior Treatment) HPI Comments: 39 year old female presenting with a sore throat and nasal congestion 1 day. Patient states she feels sinus pressure. No aggravating or alleviating factors. She has not tried taking any medications for her symptoms. Her boyfriend was recently diagnosed with strep throat along with a 64-year-old niece. Her son is now sick with similar symptoms. Denies fever, chills, cough, nausea or vomiting.  Patient is a 39 y.o. female presenting with pharyngitis. The history is provided by the patient.  Sore Throat Associated symptoms include congestion and a sore throat.    History reviewed. No pertinent past medical history. History reviewed. No pertinent past surgical history. No family history on file. History  Substance Use Topics  . Smoking status: Never Smoker   . Smokeless tobacco: Never Used  . Alcohol Use: No   OB History    No data available     Review of Systems  HENT: Positive for congestion, sinus pressure and sore throat.   All other systems reviewed and are negative.     Allergies  Tomato  Home Medications   Prior to Admission medications   Medication Sig Start Date End Date Taking? Authorizing Provider  budesonide (RHINOCORT AQUA) 32 MCG/ACT nasal spray Place 2 sprays into both nostrils daily. 04/05/14   Sylvia Kondracki M Adib Wahba, PA-C   BP 115/77 mmHg  Pulse 71  Temp(Src) 98.1 F (36.7 C) (Oral)  Resp 19  Wt 223 lb 6.4 oz (101.334 kg)  SpO2 100%  LMP 03/29/2014 Physical Exam  Constitutional: She is oriented to person, place, and time. She appears well-developed and well-nourished. No distress.  HENT:  Head: Normocephalic and atraumatic.  Right Ear: Tympanic membrane  and ear canal normal.  Left Ear: Tympanic membrane and ear canal normal.  Post oropharyngeal erythema without edema or exudate. Post nasal drip. Nasal mucosal edema. Bilateral frontal and maxillary sinus tenderness.  Eyes: Conjunctivae and EOM are normal.  Neck: Normal range of motion. Neck supple.  Cardiovascular: Normal rate, regular rhythm and normal heart sounds.   Pulmonary/Chest: Effort normal and breath sounds normal. No respiratory distress.  Musculoskeletal: Normal range of motion. She exhibits no edema.  Lymphadenopathy:    She has no cervical adenopathy.  Neurological: She is alert and oriented to person, place, and time. No sensory deficit.  Skin: Skin is warm and dry.  Psychiatric: She has a normal mood and affect. Her behavior is normal.  Nursing note and vitals reviewed.   ED Course  Procedures (including critical care time) Labs Review Labs Reviewed  RAPID STREP SCREEN  CULTURE, GROUP A STREP    Imaging Review No results found.   EKG Interpretation None      MDM   Final diagnoses:  Sinus congestion  URI (upper respiratory infection)   Pt in NAD. AFVSS. Rapid strep negative. Does not meet [facility] criteria for strep treatment. Symptoms present for 1 day. Discussed symptomatic treatment. Stable for d/c. Return precautions given. Patient states understanding of treatment care plan and is agreeable.   Carman Ching, PA-C 04/05/14 [place], DO 04/05/14 1648

## 2014-04-05 NOTE — Discharge Instructions (Signed)
Use nasal spray daily as directed along with taking over the counter decongestants. Try rinsing with nasal saline as well.  Cool Mist Vaporizers Vaporizers may help relieve the symptoms of a cough and cold. They add moisture to the air, which helps mucus to become thinner and less sticky. This makes it easier to breathe and cough up secretions. Cool mist vaporizers do not cause serious burns like hot mist vaporizers, which may also be called steamers or humidifiers. Vaporizers have not been proven to help with colds. You should not use a vaporizer if you are allergic to mold. HOME CARE INSTRUCTIONS  Follow the package instructions for the vaporizer.  Do not use anything other than distilled water in the vaporizer.  Do not run the vaporizer all of the time. This can cause mold or bacteria to grow in the vaporizer.  Clean the vaporizer after each time it is used.  Clean and dry the vaporizer well before storing it.  Stop using the vaporizer if worsening respiratory symptoms develop. Document Released: 12/14/2003 Document Revised: 03/23/2013 Document Reviewed: 08/05/2012 [facility] Patient Information 2015 [place], [place]. This information is not intended to replace advice given to you by your health care provider. Make sure you discuss any questions you have with your health care provider.  Upper Respiratory Infection, Adult An upper respiratory infection (URI) is also sometimes known as the common cold. The upper respiratory tract includes the nose, sinuses, throat, trachea, and bronchi. Bronchi are the airways leading to the lungs. Most people improve within 1 week, but symptoms can last up to 2 weeks. A residual cough may last even longer.  CAUSES Many different viruses can infect the tissues lining the upper respiratory tract. The tissues become irritated and inflamed and often become very moist. Mucus production is also common. A cold is contagious. You can easily spread the virus to others  by oral contact. This includes kissing, sharing a glass, coughing, or sneezing. Touching your mouth or nose and then touching a surface, which is then touched by another person, can also spread the virus. SYMPTOMS  Symptoms typically develop 1 to 3 days after you come in contact with a cold virus. Symptoms vary from person to person. They may include:  Runny nose.  Sneezing.  Nasal congestion.  Sinus irritation.  Sore throat.  Loss of voice (laryngitis).  Cough.  Fatigue.  Muscle aches.  Loss of appetite.  Headache.  Low-grade fever. DIAGNOSIS  You might diagnose your own cold based on familiar symptoms, since most people get a cold 2 to 3 times a year. Your caregiver can confirm this based on your exam. Most importantly, your caregiver can check that your symptoms are not due to another disease such as strep throat, sinusitis, pneumonia, asthma, or epiglottitis. Blood tests, throat tests, and X-rays are not necessary to diagnose a common cold, but they may sometimes be helpful in excluding other more serious diseases. Your caregiver will decide if any further tests are required. RISKS AND COMPLICATIONS  You may be at risk for a more severe case of the common cold if you smoke cigarettes, have chronic heart disease (such as heart failure) or lung disease (such as asthma), or if you have a weakened immune system. The very young and very old are also at risk for more serious infections. Bacterial sinusitis, middle ear infections, and bacterial pneumonia can complicate the common cold. The common cold can worsen asthma and chronic obstructive pulmonary disease (COPD). Sometimes, these complications can require emergency medical  care and may be life-threatening. PREVENTION  The best way to protect against getting a cold is to practice good hygiene. Avoid oral or hand contact with people with cold symptoms. Wash your hands often if contact occurs. There is no clear evidence that vitamin C,  vitamin E, echinacea, or exercise reduces the chance of developing a cold. However, it is always recommended to get plenty of rest and practice good nutrition. TREATMENT  Treatment is directed at relieving symptoms. There is no cure. Antibiotics are not effective, because the infection is caused by a virus, not by bacteria. Treatment may include:  Increased fluid intake. Sports drinks offer valuable electrolytes, sugars, and fluids.  Breathing heated mist or steam (vaporizer or shower).  Eating chicken soup or other clear broths, and maintaining good nutrition.  Getting plenty of rest.  Using gargles or lozenges for comfort.  Controlling fevers with ibuprofen or acetaminophen as directed by your caregiver.  Increasing usage of your inhaler if you have asthma. Zinc gel and zinc lozenges, taken in the first 24 hours of the common cold, can shorten the duration and lessen the severity of symptoms. Pain medicines may help with fever, muscle aches, and throat pain. A variety of non-prescription medicines are available to treat congestion and runny nose. Your caregiver can make recommendations and may suggest nasal or lung inhalers for other symptoms.  HOME CARE INSTRUCTIONS   Only take over-the-counter or prescription medicines for pain, discomfort, or fever as directed by your caregiver.  Use a warm mist humidifier or inhale steam from a shower to increase air moisture. This may keep secretions moist and make it easier to breathe.  Drink enough water and fluids to keep your urine clear or pale yellow.  Rest as needed.  Return to work when your temperature has returned to normal or as your caregiver advises. You may need to stay home longer to avoid infecting others. You can also use a face mask and careful hand washing to prevent spread of the virus. SEEK MEDICAL CARE IF:   After the first few days, you feel you are getting worse rather than better.  You need your caregiver's advice  about medicines to control symptoms.  You develop chills, worsening shortness of breath, or brown or red sputum. These may be signs of pneumonia.  You develop yellow or brown nasal discharge or pain in the face, especially when you bend forward. These may be signs of sinusitis.  You develop a fever, swollen neck glands, pain with swallowing, or white areas in the back of your throat. These may be signs of strep throat. SEEK IMMEDIATE MEDICAL CARE IF:   You have a fever.  You develop severe or persistent headache, ear pain, sinus pain, or chest pain.  You develop wheezing, a prolonged cough, cough up blood, or have a change in your usual mucus (if you have chronic lung disease).  You develop sore muscles or a stiff neck. Document Released: 09/11/2000 Document Revised: 06/10/2011 Document Reviewed: 06/23/2013 [facility] Patient Information 2015 [place], [place]. This information is not intended to replace advice given to you by your health care provider. Make sure you discuss any questions you have with your health care provider.

## 2014-04-05 NOTE — ED Notes (Signed)
Pt c/o sore throat and nasal congestion since this morning. Sts boyfriend recently dx with strep throat. Denies fever, other sx. Pt alert, appropriate.

## 2014-04-07 LAB — CULTURE, GROUP A STREP

## 2015-04-24 ENCOUNTER — Encounter (HOSPITAL_COMMUNITY): Payer: Self-pay | Admitting: Emergency Medicine

## 2015-04-24 ENCOUNTER — Emergency Department (HOSPITAL_COMMUNITY)
Admission: EM | Admit: 2015-04-24 | Discharge: 2015-04-24 | Disposition: A | Discharge status: Home / Self Care | Payer: Self-pay | Attending: Emergency Medicine | Admitting: Emergency Medicine

## 2015-04-24 DIAGNOSIS — J0191 Acute recurrent sinusitis, unspecified: Secondary | ICD-10-CM | POA: Insufficient documentation

## 2015-04-24 DIAGNOSIS — G44219 Episodic tension-type headache, not intractable: Secondary | ICD-10-CM | POA: Insufficient documentation

## 2015-04-24 DIAGNOSIS — Z7951 Long term (current) use of inhaled steroids: Secondary | ICD-10-CM | POA: Insufficient documentation

## 2015-04-24 HISTORY — DX: Other seasonal allergic rhinitis: J30.2

## 2015-04-24 MED ORDER — KETOROLAC TROMETHAMINE 60 MG/2ML IM SOLN: 60.0000 mg | Freq: Once | INTRAMUSCULAR | Status: AC

## 2015-04-24 MED ORDER — AMOXICILLIN-POT CLAVULANATE 875-125 MG PO TABS: 1.0000 | ORAL_TABLET | Freq: Two times a day (BID) | ORAL | Status: DC

## 2015-04-24 MED ORDER — FLUTICASONE PROPIONATE 50 MCG/ACT NA SUSP: 2.0000 | Freq: Every day | NASAL | Status: DC

## 2015-04-24 MED ORDER — KETOROLAC TROMETHAMINE 30 MG/ML IJ SOLN
30.0000 mg | Freq: Once | INTRAMUSCULAR | Status: DC
  Administered 2015-04-24: 30 mg via INTRAVENOUS
  Filled 2015-04-24: qty 1

## 2015-04-24 MED ORDER — IBUPROFEN 800 MG PO TABS: 800.0000 mg | ORAL_TABLET | Freq: Three times a day (TID) | ORAL | Status: DC | PRN

## 2015-04-24 NOTE — ED Notes (Signed)
Pt verbalized understanding of d/c instructions, prescriptions, and follow-up care. No further questions/concerns, VSS, ambulatory w/ steady gait (refused wheelchair) 

## 2015-04-24 NOTE — ED Notes (Signed)
Concha Pyo, at the bedside to speak with family

## 2015-04-24 NOTE — ED Notes (Signed)
Patient reports having headache in posterior side of head, and generalized body aches that started days ago. No vomiting or diarrhea. No fevers that she knows of. LMP the Apr 02, 2015.

## 2015-04-24 NOTE — ED Provider Notes (Signed)
CSN: [id number]     Arrival date & time 04/24/15  [id number] History   First MD Initiated Contact with Patient 04/24/15 [phone]     Chief Complaint  Patient presents with  . Headache  . Generalized Body Aches     (Consider location/radiation/quality/duration/timing/severity/associated sxs/prior Treatment) HPI   Pt with hx seasonal allergies, sinusitis p/w right sided facial pain with postnasal drip, nasal congestion, and associated posterior headache that radiates down into her neck and shoulders.  The pain began 4 days ago and has been gradually worsening, feels like prior sinus infections.  She has problems with her sinuses, particularly with the change in weather.  She has not been taking her daily allergy medication.  Has taken tylenol sinus without improvement.  Significant other notes she gets sinus problems and headaches frequently - pt admits to headaches once weekly, usually lasting 2 days.  Denies fevers, recent illness, head trauma, neck pain or stiffness, "worst" headache of life, sudden onset or "thunderclap" headache.  Denies focal neurologic deficits.   Past Medical History  Diagnosis Date  . Seasonal allergic reaction    History reviewed. No pertinent past surgical history. History reviewed. No pertinent family history. Social History  Substance Use Topics  . Smoking status: Never Smoker   . Smokeless tobacco: Never Used  . Alcohol Use: No   OB History    No data available     Review of Systems  All other systems reviewed and are negative.     Allergies  Tomato and Pollen extract  Home Medications   Prior to Admission medications   Medication Sig Start Date End Date Taking? Authorizing Provider  budesonide (RHINOCORT AQUA) 32 MCG/ACT nasal spray Place 2 sprays into both nostrils daily. 04/05/14   Robyn M Hess, PA-C   BP 105/67 mmHg  Pulse 70  Temp(Src) 98.9 F (37.2 C) (Oral)  Resp 16  Ht 5\' 4"  (1.626 m)  Wt 103.42 kg  BMI 39.12 kg/m2  SpO2 100%  LMP  04/02/2015 (Exact Date) Physical Exam  Constitutional: She appears well-developed and well-nourished. No distress.  HENT:  Head: Normocephalic and atraumatic.  Nose: Mucosal edema present. No rhinorrhea. Right sinus exhibits maxillary sinus tenderness and frontal sinus tenderness. Left sinus exhibits no maxillary sinus tenderness and no frontal sinus tenderness.  Eyes: Conjunctivae and EOM are normal. Right eye exhibits no discharge. Left eye exhibits no discharge.  Neck: Normal range of motion. Neck supple.  Cardiovascular: Normal rate and regular rhythm.   Pulmonary/Chest: Effort normal and breath sounds normal.  Musculoskeletal:       Back:  Right trapezius and right posterior neck muscle tight, tender to palpation, cause increase in right sided headache.   Neurological: She is alert.  CN II-XII intact, EOMs intact, no pronator drift, grip strengths equal bilaterally; strength 5/5 in all extremities, sensation intact in all extremities; finger to nose, heel to shin, rapid alternating movements normal.     Skin: She is not diaphoretic.  Nursing note and vitals reviewed.   ED Course  Procedures (including critical care time) Labs Review Labs Reviewed - No data to display  Imaging Review No results found. I have personally reviewed and evaluated these images and lab results as part of my medical decision-making.   EKG Interpretation None      MDM   Final diagnoses:  Acute recurrent sinusitis, unspecified location  Episodic tension-type headache, not intractable   Afebrile, nontoxic patient with occipital headache with radiation into neck and shoulders as well  as sinus pain/pressure, nasal symptoms.  Symptoms have been gradual, headache without red flags.  Sinus symptoms began first.  Right sided headache with tightness of right neck/shoulder musculature suggests tension headache, right facial pain may be related to this but given patient's sinus symptoms as well, will cover  for sinusitis.  Pt's significant other notes these are both chronic problems, last exacerbation that has been this severe was 7-8 months ago.  Pt has PCP in [place], [place] - I have advised close follow up.  Pt given IM toradol - stated she needed to be able to drive, therefore refrained from sedating medications.  D/C home with flonase, augmentin, ibuprofen, recommendations for ice/stretching/massage/attention to posture and use of ergonomic materials at work (pt works on [employer] all day).   Discussed result, findings, treatment, and follow up  with patient.  Pt given return precautions.  Pt verbalizes understanding and agrees with plan.        Clayton Bibles, PA-C [phone]  David Glick, MD [phone]

## 2015-04-24 NOTE — ED Notes (Signed)
Connie Underwood, at the bedside.

## 2015-04-24 NOTE — Discharge Instructions (Signed)
Read the information below.  Use the prescribed medication as directed.  Please discuss all new medications with your pharmacist.  You may return to the Emergency Department at any time for worsening condition or any new symptoms that concern you.  If you develop high fevers that do not resolve with tylenol or ibuprofen, you have difficulty swallowing or breathing, or you are unable to tolerate fluids by mouth, return to the ER for a recheck.      Sinusitis, Adult Sinusitis is redness, soreness, and inflammation of the paranasal sinuses. Paranasal sinuses are air pockets within the bones of your face. They are located beneath your eyes, in the middle of your forehead, and above your eyes. In healthy paranasal sinuses, mucus is able to drain out, and air is able to circulate through them by way of your nose. However, when your paranasal sinuses are inflamed, mucus and air can become trapped. This can allow bacteria and other germs to grow and cause infection. Sinusitis can develop quickly and last only a short time (acute) or continue over a long period (chronic). Sinusitis that lasts for more than 12 weeks is considered chronic. CAUSES Causes of sinusitis include:  Allergies.  Structural abnormalities, such as displacement of the cartilage that separates your nostrils (deviated septum), which can decrease the air flow through your nose and sinuses and affect sinus drainage.  Functional abnormalities, such as when the small hairs (cilia) that line your sinuses and help remove mucus do not work properly or are not present. SIGNS AND SYMPTOMS Symptoms of acute and chronic sinusitis are the same. The primary symptoms are pain and pressure around the affected sinuses. Other symptoms include:  Upper toothache.  Earache.  Headache.  Bad breath.  Decreased sense of smell and taste.  A cough, which worsens when you are lying flat.  Fatigue.  Fever.  Thick drainage from your nose, which often  is green and may contain pus (purulent).  Swelling and warmth over the affected sinuses. DIAGNOSIS Your health care provider will perform a physical exam. During your exam, your health care provider may perform any of the following to help determine if you have acute sinusitis or chronic sinusitis:  Look in your nose for signs of abnormal growths in your nostrils (nasal polyps).  Tap over the affected sinus to check for signs of infection.  View the inside of your sinuses using an imaging device that has a light attached (endoscope). If your health care provider suspects that you have chronic sinusitis, one or more of the following tests may be recommended:  Allergy tests.  Nasal culture. A sample of mucus is taken from your nose, sent to a lab, and screened for bacteria.  Nasal cytology. A sample of mucus is taken from your nose and examined by your health care provider to determine if your sinusitis is related to an allergy. TREATMENT Most cases of acute sinusitis are related to a viral infection and will resolve on their own within 10 days. Sometimes, medicines are prescribed to help relieve symptoms of both acute and chronic sinusitis. These may include pain medicines, decongestants, nasal steroid sprays, or saline sprays. However, for sinusitis related to a bacterial infection, your health care provider will prescribe antibiotic medicines. These are medicines that will help kill the bacteria causing the infection. Rarely, sinusitis is caused by a fungal infection. In these cases, your health care provider will prescribe antifungal medicine. For some cases of chronic sinusitis, surgery is needed. Generally, these are cases  in which sinusitis recurs more than 3 times per year, despite other treatments. HOME CARE INSTRUCTIONS  Drink plenty of water. Water helps thin the mucus so your sinuses can drain more easily.  Use a humidifier.  Inhale steam 3-4 times a day (for example, sit in  the bathroom with the shower running).  Apply a warm, moist washcloth to your face 3-4 times a day, or as directed by your health care provider.  Use saline nasal sprays to help moisten and clean your sinuses.  Take medicines only as directed by your health care provider.  If you were prescribed either an antibiotic or antifungal medicine, finish it all even if you start to feel better. SEEK IMMEDIATE MEDICAL CARE IF:  You have increasing pain or severe headaches.  You have nausea, vomiting, or drowsiness.  You have swelling around your face.  You have vision problems.  You have a stiff neck.  You have difficulty breathing.   This information is not intended to replace advice given to you by your health care provider. Make sure you discuss any questions you have with your health care provider.   Document Released: 03/18/2005 Document Revised: 04/08/2014 Document Reviewed: 04/02/2011 Elsevier Interactive Patient Education 2016 Elsevier Inc.  Sinus Rinse WHAT IS A SINUS RINSE? A sinus rinse is a simple home treatment that is used to rinse your sinuses with a sterile mixture of salt and water (saline solution). Sinuses are air-filled spaces in your skull behind the bones of your face and forehead that open into your nasal cavity. You will use the following:  Saline solution.  Neti pot or spray bottle. This releases the saline solution into your nose and through your sinuses. Neti pots and spray bottles can be purchased at Press [employer], a health food store, or online. WHEN WOULD I DO A SINUS RINSE? A sinus rinse can help to clear mucus, dirt, dust, or pollen from the nasal cavity. You may do a sinus rinse when you have a cold, a virus, nasal allergy symptoms, a sinus infection, or stuffiness in the nose or sinuses. If you are considering a sinus rinse:  Ask your child's health care provider before performing a sinus rinse on your child.  Do not do a sinus rinse if you  have had ear or nasal surgery, ear infection, or blocked ears. HOW DO I DO A SINUS RINSE?  Wash your hands.  Disinfect your device according to the directions provided and then dry it.  Use the solution that comes with your device or one that is sold separately in stores. Follow the mixing directions on the package.  Fill your device with the amount of saline solution as directed by the device instructions.  Stand over a sink and tilt your head sideways over the sink.  Place the spout of the device in your upper nostril (the one closer to the ceiling).  Gently pour or squeeze the saline solution into the nasal cavity. The liquid should drain to the lower nostril if you are not overly congested.  Gently blow your nose. Blowing too hard may cause ear pain.  Repeat in the other nostril.  Clean and rinse your device with clean water and then air-dry it. ARE THERE RISKS OF A SINUS RINSE?  Sinus rinse is generally very safe and effective. However, there are a few risks, which include:   A burning sensation in the sinuses. This may happen if you do not make the saline solution as directed. Make sure  to follow all directions when making the saline solution.  Infection from contaminated water. This is rare, but possible.  Nasal irritation.   This information is not intended to replace advice given to you by your health care provider. Make sure you discuss any questions you have with your health care provider.   Document Released: 10/13/2013 Document Reviewed: 10/13/2013 Elsevier Interactive Patient Education [employer].    Emergency Department Resource Guide 1) Find a Doctor and Pay Out of Pocket Although you won't have to find out who is covered by your insurance plan, it is a good idea to ask around and get recommendations. You will then need to call the office and see if the doctor you have chosen will accept you as a new patient and what types of options they offer for  patients who are self-pay. Some doctors offer discounts or will set up payment plans for their patients who do not have insurance, but you will need to ask so you aren't surprised when you get to your appointment.  2) Contact Your Local Health Department Not all health departments have doctors that can see patients for sick visits, but many do, so it is worth a call to see if yours does. If you don't know where your local health department is, you can check in your phone book. The CDC also has a tool to help you locate your state's health department, and many state websites also have listings of all of their local health departments.  3) Find a [facility] If your illness is not likely to be very severe or complicated, you may want to try a walk in clinic. These are popping up all over the country in pharmacies, drugstores, and shopping centers. They're usually staffed by nurse practitioners or physician assistants that have been trained to treat common illnesses and complaints. They're usually fairly quick and inexpensive. However, if you have serious medical issues or chronic medical problems, these are probably not your best option.  No Primary Care Doctor: - Call Health Connect at  [phone] - they can help you locate a primary care doctor that  accepts your insurance, provides certain services, etc. - Physician Referral Service- [phone]  Chronic Pain Problems: Organization         Address  Phone   Notes  [facility]  [phone] Patients need to be referred by their primary care doctor.   Medication Assistance: Organization         Address  Phone   Notes  [facility] Medication [facility] [address] [place], [place] [address] [phone] --Must be a resident of [facility] -- Must have NO insurance coverage whatsoever (no Medicaid/ Medicare, etc.) -- The pt. MUST have a primary care doctor that directs their care regularly  and follows them in the community   MedAssist  [phone]   [employer]  [phone]    Agencies that provide inexpensive medical care: Organization         Address  Phone   Notes  [place]  [phone]   [facility]    [phone]   [facility] [address], [place] [address] [phone]   [place] [phone]   Planned Parenthood    [phone]   [facility]    [phone]   [facility]  [address], [place] Phone:  [phone], Fax:  [phone] Hours of Operation:  9 am - 6 pm, M-F.  Also accepts Medicaid/Medicare and self-pay.  [facility] for [address], [address], [place] Phone: [phone], Fax: [phone]. Hours of Operation:  8:30 am - 5:30 pm, M-F.  Also accepts Medicaid and self-pay.  [facility] [place] [address], [place] Phone: [phone]   [place], [place], [place] [phone], Ext. [phone] Mondays & Thursdays: 7-9 AM.  First 15 patients are seen on a first come, first serve basis.    [place] Providers:  Organization         Address  Phone   Notes  [facility] [address], [place] [phone] Also accepts self-pay patients.  [facility] [id number] [place], [place]  [phone]   [place], [address], [place] [phone]   [facility] [address] [place] [phone]   Lucianne Lei [address], [place]   [phone] Only accepts [place] Access [place] patients after they have their name applied to their card.   Self-Pay (no insurance) in [facility]:  Organization         Address  Phone   Notes  [place]ick[place]  Cell Patients, [facility] [place] [phone]   [facility] [place] [phone]   [facility] [place]  [place], [place], [place] [phone]   [facility]/Dr. Osei-Bonsu  [address] [place] or [address], [place] [phone] Phone number for both [place] and [place] locations is the same.  [facility] [address], [place] [phone]   [facility] [address] [place] or [address] [phone]   [facility] [facility] [address] [place] [phone], phone; [phone], fax Sees patients 1st and 3rd Saturday of every month.  Must not qualify for public or private insurance (i.e. Medicaid, Medicare, [place] Health Choice, Veterans' Benefits)  Household income should be no more than 200% of the poverty level The clinic cannot treat you if you are pregnant or think you are pregnant  Sexually transmitted diseases are not treated at the clinic.    Dental Care: Organization         Address  Phone  Notes  [facility] [place] [phone] Accepts children up to age 4 who are enrolled in [place] or [place]; pregnant women with a Medicaid card; and children who have applied for Medicaid or [place] Health Choice, but were declined, whose parents can pay a reduced fee at time of service.  [facility] Department of [facility]  [address], [place] [phone] Accepts children up to age 57 who are enrolled in [place] or [place]; pregnant women with a Medicaid card; and children who have applied for Medicaid or [place] Health Choice, but were declined, whose parents can pay a reduced fee at time of service.  [facility]  Access PRO[facility]  [address] [place], [place] [phone] Patients are seen by appointment only. Walk-ins are not accepted. [place] will see patients 68 years of age and older. Monday - Tuesday (8am-5pm) Most Wednesdays (8:30-5pm) $30 per visit, cash only  [facility] Adult Dental Access PROGRAM  [address], [facility] [phone] Patients are seen by appointment only. Walk-ins are not accepted. [place] will see patients 5 years of age and older. One Wednesday Evening (Monthly: Volunteer Based).  $30 per visit, cash only  [place]  [phone] for adults; Children under age 29, call Graduate Pediatric Dentistry at [phone]. Children aged 14-14, please call [phone] to request a pediatric application.  Dental services are provided in all areas of dental care including fillings, crowns and bridges, complete and partial dentures, implants, gum treatment, root canals, and extractions. Preventive care is also provided. Treatment is provided to both adults and children. Patients are selected via a lottery and there is often a waiting list.   [facility] [address], [place]  [phone] [email or web address]   [facility] [address] [place], [place] [phone], Ext. [phone] Second and Fourth Thursday of each month, opens at 6:30 AM; Clinic ends at 9 AM.  Patients are seen on a first-come first-served basis, and a limited number are seen during each clinic.   [facility]  [address] [place] Danker [place], [place] [phone]   Eligibility Requirements You must have lived in [place], [place], or [place] counties for at least the last three months.   You cannot be eligible for state or federal sponsored [employer], including [employer], [place], or [employer].   You generally cannot be eligible for healthcare insurance through your employer.    How to apply: Eligibility  screenings are held every Tuesday and Wednesday afternoon from 1:00 pm until 4:00 pm. You do not need an appointment for the interview!  [facility] [address], [place], [place]   [place]  [place] Department  [place]  [phone]    Behavioral Health Resources in the Community: Intensive Outpatient Programs Organization         Address  Phone  Notes  [facility] [place]. [address], [place], [place] [phone]   [facility] [address] [place], [place]   ADS: Alcohol & Drug Svcs [address] [place], [place]   [place] [address],  [place], [place] or [phone]   Substance Abuse Resources Organization         Address  Phone  Notes  Alcohol and Drug Services  [phone]   [place]  [phone]   [facility] [place]   Chinita Pester  [phone]   Residential & Outpatient Substance Abuse Program  [phone]   Psychological Services Organization         Address  Phone  Notes  [facility] [place]  [place]  [phone]   [place] [address], [place] or [phone]    Mobile Crisis Teams Organization         Address  Phone  Notes  [facility], Mobile Crisis Care Unit  [phone]   [facility]  [address] [place], [place]   [place],  [place] [phone]    Self-Help/Support Groups Organization         Address  Phone             Notes  [facility]ental [facility] [place] - variety of support groups  [place] Call for more information  Narcotics Anonymous (NA), [facility] [address], [employer] [place]  2 meetings at  this location   Special educational needs teacher         Address  Phone  Notes  [facility] [place],    [place]  [phone]   [facility]  [address], [place] [phone], [place], [place]   [place], [place] [phone] Admissions: 8am-3pm M-F  Incentives Substance [place] [address]    [place], [place] [phone]   [facility] [address] [place], [place], [place]   [facility] [address],  [place], [place]   [facility] - Intensive Outpatient [address] Kristeen [address], [place], [place]   [facility] [place].,  [place], [place] [phone] or [phone]   [facility] ([facility]) [address], [place], [place] Accepts Medicaid  [facility] [place] [address]  [place] [phone] Substance Abuse/Addiction Treatment   [facility] Organization         Address  Phone  Notes  [facility]  [phone]   Domenic Schwab, PhD [address] Arlis [place], [place]   [phone] or [phone]   [place] [place], [place] [phone]   [facility] [address] [place], [place] [phone] Insurance/Medicaid/sponsorship through [facility] and Families [address]                                    [place], [place] [phone] [place] [address], [place] [phone]    Dr. Adele Schilder  [phone]   [facility] Dept. 1) [address] [place] 2) [place] 3)  [place] [address], [place] [phone]  [phone]   [place] [phone] or [phone] (After Hours)

## 2015-07-03 ENCOUNTER — Encounter (HOSPITAL_COMMUNITY): Payer: Self-pay | Admitting: Emergency Medicine

## 2015-07-03 ENCOUNTER — Emergency Department (HOSPITAL_COMMUNITY): Admission: EM | Admit: 2015-07-03 | Discharge: 2015-07-03 | Discharge status: Against Medical Advice | Payer: Self-pay

## 2015-07-03 ENCOUNTER — Emergency Department (INDEPENDENT_AMBULATORY_CARE_PROVIDER_SITE_OTHER)
Admission: EM | Admit: 2015-07-03 | Discharge: 2015-07-03 | Disposition: A | Discharge status: Home / Self Care | Payer: Self-pay | Source: Home / Self Care | Attending: Family Medicine | Admitting: Family Medicine

## 2015-07-03 DIAGNOSIS — G44219 Episodic tension-type headache, not intractable: Secondary | ICD-10-CM

## 2015-07-03 MED ORDER — KETOROLAC TROMETHAMINE 30 MG/ML IJ SOLN
30.0000 mg | Freq: Once | INTRAMUSCULAR | Status: AC
  Administered 2015-07-03: 30 mg via INTRAMUSCULAR

## 2015-07-03 MED ORDER — DOXEPIN HCL 50 MG PO CAPS: 50.0000 mg | ORAL_CAPSULE | Freq: Every day | ORAL | Status: DC

## 2015-07-03 MED ORDER — KETOROLAC TROMETHAMINE 30 MG/ML IJ SOLN
INTRAMUSCULAR | Status: AC
  Filled 2015-07-03: qty 1

## 2015-07-03 NOTE — ED Notes (Signed)
Urgent care called and informed us that this pt. Is at the Urgent Care

## 2015-07-03 NOTE — ED Provider Notes (Signed)
CSN: [id number]     Arrival date & time 07/03/15  1332 History   First MD Initiated Contact with Patient 07/03/15 1624     Chief Complaint  Patient presents with  . Headache   (Consider location/radiation/quality/duration/timing/severity/associated sxs/prior Treatment) Patient is a 40 y.o. female presenting with headaches. The history is provided by the patient.  Headache Pain location:  Frontal and occipital Quality:  Dull Duration:  8 hours Progression:  Unchanged Chronicity:  New Similar to prior headaches: no   Context: emotional stress   Associated symptoms: no abdominal pain, no blurred vision, no congestion, no dizziness, no ear pain, no fever, no loss of balance, no nausea, no neck pain, no neck stiffness, no paresthesias, no vomiting and no weakness     Past Medical History  Diagnosis Date  . Seasonal allergic reaction    History reviewed. No pertinent past surgical history. No family history on file. Social History  Substance Use Topics  . Smoking status: Never Smoker   . Smokeless tobacco: Never Used  . Alcohol Use: No   OB History    No data available     Review of Systems  Constitutional: Negative.  Negative for fever.  HENT: Negative for congestion and ear pain.   Eyes: Negative for blurred vision.  Cardiovascular: Negative.   Gastrointestinal: Negative for nausea, vomiting and abdominal pain.  Genitourinary: Negative.   Musculoskeletal: Negative for neck pain and neck stiffness.  Neurological: Positive for headaches. Negative for dizziness, facial asymmetry, weakness, light-headedness, paresthesias and loss of balance.  All other systems reviewed and are negative.   Allergies  Tomato and Pollen extract  Home Medications   Prior to Admission medications   Medication Sig Start Date End Date Taking? Authorizing Provider  Naproxen Sodium (ALEVE PO) Take by mouth.   Yes Historical Provider, MD  acetaminophen (TYLENOL) 325 MG tablet Take 650 mg by mouth  every 6 (six) hours as needed for mild pain or fever.    Historical Provider, MD  amoxicillin-clavulanate (AUGMENTIN) 875-125 MG tablet Take 1 tablet by mouth 2 (two) times daily. One po bid x 7 days Patient not taking: Reported on 07/03/2015 04/24/15   Clayton Bibles, PA-C  doxepin (SINEQUAN) 50 MG capsule Take 1 capsule (50 mg total) by mouth at bedtime. 07/03/15   Billy Fischer, MD  fluticasone (FLONASE) 50 MCG/ACT nasal spray Place 2 sprays into both nostrils daily. Patient not taking: Reported on 07/03/2015 04/24/15   Clayton Bibles, PA-C  ibuprofen (ADVIL,MOTRIN) 800 MG tablet Take 1 tablet (800 mg total) by mouth every 8 (eight) hours as needed for mild pain or moderate pain. Patient not taking: Reported on 07/03/2015 04/24/15   Clayton Bibles, PA-C  Multiple Vitamin (MULTIVITAMIN WITH MINERALS) TABS tablet Take 1 tablet by mouth daily.    Historical Provider, MD   Meds Ordered and Administered this Visit   Medications  ketorolac (TORADOL) 30 MG/ML injection 30 mg (not administered)    BP 110/68 mmHg  Pulse 69  Temp(Src) 99.1 F (37.3 C) (Oral)  Resp 16  SpO2 100%  LMP 06/17/2015 No data found.   Physical Exam  Constitutional: She is oriented to person, place, and time. She appears well-developed and well-nourished. No distress.  HENT:  Right Ear: External ear normal.  Left Ear: External ear normal.  Mouth/Throat: Oropharynx is clear and moist.  Eyes: Conjunctivae and EOM are normal. Pupils are equal, round, and reactive to light.  Neck: Normal range of motion. Neck supple.  Cardiovascular: Normal  rate, normal heart sounds and intact distal pulses.   Pulmonary/Chest: Effort normal and breath sounds normal.  Lymphadenopathy:    She has no cervical adenopathy.  Neurological: She is alert and oriented to person, place, and time. No cranial nerve deficit.  Skin: Skin is warm and dry.  Nursing note and vitals reviewed.   ED Course  Procedures (including critical care time)  Labs Review Labs  Reviewed - No data to display  Imaging Review No results found.   Visual Acuity Review  Right Eye Distance:   Left Eye Distance:   Bilateral Distance:    Right Eye Near:   Left Eye Near:    Bilateral Near:         MDM   1. Episodic tension-type headache, not intractable    Meds ordered this encounter  Medications  . Naproxen Sodium (ALEVE PO)    Sig: Take by mouth.  Marland Kitchen ketorolac (TORADOL) 30 MG/ML injection 30 mg    Sig:   . doxepin (SINEQUAN) 50 MG capsule    Sig: Take 1 capsule (50 mg total) by mouth at bedtime.    Dispense:  10 capsule    Refill:  0       Billy Fischer, MD 07/03/15 [phone]

## 2015-07-03 NOTE — ED Notes (Addendum)
Reports waking with headache.  Base of skull and under eyes is wear pain is located.  No cough, cold, or runny nose.  Denies ear or throat pain.  .  Patient complains of light sensitivity. Patient reports this is a bad headache, but NOT  the worst headache she has ever had

## 2015-10-11 ENCOUNTER — Encounter (HOSPITAL_COMMUNITY): Payer: Self-pay

## 2015-10-11 ENCOUNTER — Emergency Department (HOSPITAL_COMMUNITY)
Admission: EM | Admit: 2015-10-11 | Discharge: 2015-10-11 | Disposition: A | Discharge status: Home / Self Care | Payer: 59 | Attending: Emergency Medicine | Admitting: Emergency Medicine

## 2015-10-11 DIAGNOSIS — R0981 Nasal congestion: Secondary | ICD-10-CM | POA: Insufficient documentation

## 2015-10-11 DIAGNOSIS — R51 Headache: Secondary | ICD-10-CM | POA: Diagnosis present

## 2015-10-11 MED ORDER — PSEUDOEPHEDRINE HCL 30 MG/5ML PO SYRP: 30.0000 mg | ORAL_SOLUTION | Freq: Four times a day (QID) | ORAL | Status: DC | PRN

## 2015-10-11 MED ORDER — SALINE SPRAY 0.65 % NA SOLN: 1.0000 | NASAL | Status: DC | PRN

## 2015-10-11 MED ORDER — AMOXICILLIN-POT CLAVULANATE 875-125 MG PO TABS: 1.0000 | ORAL_TABLET | Freq: Two times a day (BID) | ORAL | Status: DC

## 2015-10-11 NOTE — ED Notes (Signed)
Declined W/C at D/C and was escorted to lobby by RN. 

## 2015-10-11 NOTE — ED Provider Notes (Signed)
CSN: [id number]     Arrival date & time 10/11/15  [id number] History   First MD Initiated Contact with Patient 10/11/15 [phone]     Chief Complaint  Patient presents with  . frontal headache, congestion      (Consider location/radiation/quality/duration/timing/severity/associated sxs/prior Treatment) HPI Comments: Patient with chief complaint of sinus congestion and frontal headache x well over a week and worsening in the last 2 days.  She denies any fevers or chills.  She denies any associated cough.  She reports recurrent sinus infections.  There are no other associated symptoms.  The history is provided by the patient. No language interpreter was used.    Past Medical History  Diagnosis Date  . Seasonal allergic reaction    History reviewed. No pertinent past surgical history. No family history on file. Social History  Substance Use Topics  . Smoking status: Never Smoker   . Smokeless tobacco: Never Used  . Alcohol Use: No   OB History    No data available     Review of Systems  Constitutional: Negative for fever and chills.  HENT: Positive for postnasal drip, rhinorrhea, sinus pressure, sneezing and sore throat.   Respiratory: Negative for cough and shortness of breath.   Cardiovascular: Negative for chest pain.  Gastrointestinal: Negative for nausea, vomiting, abdominal pain, diarrhea and constipation.  Genitourinary: Negative for dysuria.  All other systems reviewed and are negative.     Allergies  Tomato and Pollen extract  Home Medications   Prior to Admission medications   Medication Sig Start Date End Date Taking? Authorizing Provider  acetaminophen (TYLENOL) 325 MG tablet Take 650 mg by mouth every 6 (six) hours as needed for mild pain or fever.    Historical Provider, MD  doxepin (SINEQUAN) 50 MG capsule Take 1 capsule (50 mg total) by mouth at bedtime. 07/03/15   Billy Fischer, MD  Multiple Vitamin (MULTIVITAMIN WITH MINERALS) TABS tablet Take 1 tablet by mouth  daily.    Historical Provider, MD  Naproxen Sodium (ALEVE PO) Take by mouth.    Historical Provider, MD  pseudoephedrine (SUDAFED) 30 MG/5ML syrup Take 5 mLs (30 mg total) by mouth every 6 (six) hours as needed for congestion. 10/11/15   Montine Circle, PA-C  sodium chloride (OCEAN) 0.65 % SOLN nasal spray Place 1 spray into both nostrils as needed for congestion. 10/11/15   Montine Circle, PA-C   BP 112/74 mmHg  Pulse 71  Temp(Src) 98.3 F (36.8 C) (Oral)  Resp 18  Ht 5\' 4"  (1.626 m)  Wt 106.595 kg  BMI 40.32 kg/m2  SpO2 98%  LMP 09/27/2015 Physical Exam  Constitutional: She appears well-developed and well-nourished. No distress.  HENT:  Head: Normocephalic.  Right Ear: External ear normal.  Left Ear: External ear normal.  Mildly erythematous, no tonsillar exudate, no abscess, no stridor, uvula is midline  Mild congestion seen behind left TM  Eyes: Conjunctivae and EOM are normal. Pupils are equal, round, and reactive to light.  Neck: Normal range of motion. Neck supple.  Cardiovascular: Normal rate, regular rhythm and normal heart sounds.  Exam reveals no gallop and no friction rub.   No murmur heard. Pulmonary/Chest: Effort normal and breath sounds normal. No stridor. No respiratory distress. She has no wheezes. She has no rales. She exhibits no tenderness.  CTAB  Abdominal: Soft. Bowel sounds are normal. She exhibits no distension. There is no tenderness.  Musculoskeletal: Normal range of motion. She exhibits no tenderness.  Neurological: She is alert.  Skin: Skin is warm and dry. No rash noted. She is not diaphoretic.  Psychiatric: She has a normal mood and affect. Her behavior is normal. Judgment and thought content normal.  Nursing note and vitals reviewed.   ED Course  Procedures (including critical care time)   MDM   Final diagnoses:  Sinus congestion    Patient with sinus congestion.  Reports having had symptoms for over a week and recently worsening.  Will  treat with augmentin.  Recommend PCP follow-up.    Montine Circle, PA-C 10/11/15 [id number]  Carmin Muskrat, MD 10/12/15 [phone]

## 2015-10-11 NOTE — ED Notes (Addendum)
Patient here with congestion and frontal headache x 2 days, denies cough. States if she moves too fast then she gets dizzy, NAD facial pain with palpation. No neuro deficits

## 2015-10-11 NOTE — Discharge Instructions (Signed)
You have been diagnosed with sinus congestion.  Please take your medications as directed.  If you do not have improvement of your symptoms in 7-10 days, you may require treatment with antibiotics.  Please follow-up with your Primary Care Doctor.

## 2015-12-13 ENCOUNTER — Ambulatory Visit (HOSPITAL_COMMUNITY)
Admission: EM | Admit: 2015-12-13 | Discharge: 2015-12-13 | Disposition: A | Discharge status: Home / Self Care | Payer: 59 | Attending: Internal Medicine | Admitting: Internal Medicine

## 2015-12-13 ENCOUNTER — Encounter (HOSPITAL_COMMUNITY): Payer: Self-pay | Admitting: Emergency Medicine

## 2015-12-13 DIAGNOSIS — J029 Acute pharyngitis, unspecified: Secondary | ICD-10-CM | POA: Insufficient documentation

## 2015-12-13 DIAGNOSIS — Z888 Allergy status to other drugs, medicaments and biological substances status: Secondary | ICD-10-CM | POA: Diagnosis not present

## 2015-12-13 DIAGNOSIS — J039 Acute tonsillitis, unspecified: Secondary | ICD-10-CM | POA: Diagnosis not present

## 2015-12-13 DIAGNOSIS — Z79899 Other long term (current) drug therapy: Secondary | ICD-10-CM | POA: Diagnosis not present

## 2015-12-13 DIAGNOSIS — J069 Acute upper respiratory infection, unspecified: Secondary | ICD-10-CM | POA: Insufficient documentation

## 2015-12-13 LAB — POCT RAPID STREP A: Streptococcus, Group A Screen (Direct): NEGATIVE

## 2015-12-13 MED ORDER — FLUTICASONE PROPIONATE 50 MCG/ACT NA SUSP: 1.0000 | Freq: Every day | NASAL | 2 refills | Status: DC

## 2015-12-13 MED ORDER — AMOXICILLIN 500 MG PO CAPS: 500.0000 mg | ORAL_CAPSULE | Freq: Three times a day (TID) | ORAL | 0 refills | Status: DC

## 2015-12-13 NOTE — ED Provider Notes (Signed)
CSN: [id number]     Arrival date & time 12/13/15  1320 History   First MD Initiated Contact with Patient 12/13/15 1420     No chief complaint on file.  (Consider location/radiation/quality/duration/timing/severity/associated sxs/prior Treatment) HPI 40 Y/O FEMALE WORKS AT [employer]. LOTS OF FOLKS ARE COUGHING THINKS SHE PICKED UP HER SYMPTOMS AT WORK.  COUGH, SORE THROAT. NOT FEELING WELL. TAKING OTC MEDS WITHOUT RELIEF.  NO CURRENT PAIN Past Medical History:  Diagnosis Date  . Seasonal allergic reaction    No past surgical history on file. No family history on file. Social History  Substance Use Topics  . Smoking status: Never Smoker  . Smokeless tobacco: Never Used  . Alcohol use No   OB History    No data available     Review of Systems  Denies: HEADACHE, NAUSEA, ABDOMINAL PAIN, CHEST PAIN, CONGESTION, DYSURIA, SHORTNESS OF BREATH   Allergies  Tomato and Pollen extract  Home Medications   Prior to Admission medications   Medication Sig Start Date End Date Taking? Authorizing Provider  acetaminophen (TYLENOL) 325 MG tablet Take 650 mg by mouth every 6 (six) hours as needed for mild pain or fever.    Historical Provider, MD  amoxicillin-clavulanate (AUGMENTIN) 875-125 MG tablet Take 1 tablet by mouth every 12 (twelve) hours. 10/11/15   Montine Circle, PA-C  doxepin (SINEQUAN) 50 MG capsule Take 1 capsule (50 mg total) by mouth at bedtime. 07/03/15   Billy Fischer, MD  Multiple Vitamin (MULTIVITAMIN WITH MINERALS) TABS tablet Take 1 tablet by mouth daily.    Historical Provider, MD  Naproxen Sodium (ALEVE PO) Take by mouth.    Historical Provider, MD  pseudoephedrine (SUDAFED) 30 MG/5ML syrup Take 5 mLs (30 mg total) by mouth every 6 (six) hours as needed for congestion. 10/11/15   Montine Circle, PA-C  sodium chloride (OCEAN) 0.65 % SOLN nasal spray Place 1 spray into both nostrils as needed for congestion. 10/11/15   Montine Circle, PA-C   Meds Ordered and Administered  this Visit  Medications - No data to display  BP 113/72 (BP Location: Right Arm)   Pulse 79   Temp 98.8 F (37.1 C) (Oral)   Resp 18   Ht 5' 4.5" (1.638 m)   Wt 238 lb (108 kg)   SpO2 99%   BMI 40.22 kg/m  No data found.   Physical Exam NURSES NOTES AND VITAL SIGNS REVIEWED. CONSTITUTIONAL: Well developed, well nourished, no acute distress HEENT: normocephalic, atraumatic EYES: Conjunctiva normal NECK:normal ROM, supple, no adenopathy PULMONARY:No respiratory distress, normal effort ABDOMINAL: Soft, ND, NT BS+, No CVAT MUSCULOSKELETAL: Normal ROM of all extremities,  SKIN: warm and dry without rash PSYCHIATRIC: Mood and affect, behavior are normal  Urgent Care Course   Clinical Course    Procedures (including critical care time)  Labs Review Labs Reviewed - No data to display  Imaging Review No results found.   Visual Acuity Review  Right Eye Distance:   Left Eye Distance:   Bilateral Distance:    Right Eye Near:   Left Eye Near:    Bilateral Near:         MDM   1. Tonsillitis   2. Acute pharyngitis, unspecified etiology   3. URI (upper respiratory infection)      Patient is reassured that there are no issues that require transfer to higher level of care at this time or additional tests. Patient is advised to continue home symptomatic treatment. Patient is advised that if  there are new or worsening symptoms to attend the emergency department, contact primary care provider, or return to UC. Instructions of care provided discharged home in stable condition.    THIS NOTE WAS GENERATED USING A VOICE RECOGNITION SOFTWARE PROGRAM. ALL REASONABLE EFFORTS  WERE MADE TO PROOFREAD THIS DOCUMENT FOR ACCURACY.  I have verbally reviewed the discharge instructions with the patient. A printed AVS was given to the patient.  All questions were answered prior to discharge.      Konrad Felix, PA 12/13/15 1737

## 2015-12-13 NOTE — ED Triage Notes (Signed)
The patient presented to the [facility] with a complaint of a sore throat x 4 days. 

## 2015-12-16 LAB — CULTURE, GROUP A STREP (THRC)

## 2016-03-05 ENCOUNTER — Encounter (HOSPITAL_COMMUNITY): Payer: Self-pay | Admitting: Family Medicine

## 2016-03-05 ENCOUNTER — Ambulatory Visit (HOSPITAL_COMMUNITY)
Admission: EM | Admit: 2016-03-05 | Discharge: 2016-03-05 | Disposition: A | Discharge status: Home / Self Care | Payer: 59 | Attending: Family Medicine | Admitting: Family Medicine

## 2016-03-05 DIAGNOSIS — J00 Acute nasopharyngitis [common cold]: Secondary | ICD-10-CM | POA: Diagnosis not present

## 2016-03-05 DIAGNOSIS — J040 Acute laryngitis: Secondary | ICD-10-CM

## 2016-03-05 MED ORDER — AZITHROMYCIN 250 MG PO TABS: 250.0000 mg | ORAL_TABLET | Freq: Every day | ORAL | 0 refills | Status: DC

## 2016-03-05 MED ORDER — IPRATROPIUM BROMIDE 0.06 % NA SOLN: 2.0000 | Freq: Four times a day (QID) | NASAL | 0 refills | Status: DC

## 2016-03-05 NOTE — ED Triage Notes (Signed)
Pt here for sore throat and congestion x 4 days. Pt has lost her voice and redness in throat. sts productive cough and chest pain with coughing.

## 2016-03-05 NOTE — ED Provider Notes (Signed)
CSN: [id number]     Arrival date & time 03/05/16  1010 History   None    Chief Complaint  Patient presents with  . Sore Throat  . Cough  . Nasal Congestion   (Consider location/radiation/quality/duration/timing/severity/associated sxs/prior Treatment) Patient c/o sore throat and congestion for 4 days.  She has lost her voice and she is having a cough and some chest pain with cough.   The history is provided by the patient.  Sore Throat  This is a new problem. The current episode started more than 2 days ago. The problem occurs constantly. The problem has not changed since onset.Associated symptoms include chest pain. The symptoms are aggravated by coughing. Nothing relieves the symptoms. She has tried nothing for the symptoms.  Cough  Associated symptoms: chest pain, rhinorrhea and sore throat     Past Medical History:  Diagnosis Date  . Seasonal allergic reaction    History reviewed. No pertinent surgical history. History reviewed. No pertinent family history. Social History  Substance Use Topics  . Smoking status: Never Smoker  . Smokeless tobacco: Never Used  . Alcohol use No   OB History    No data available     Review of Systems  Constitutional: Negative.   HENT: Positive for congestion, rhinorrhea and sore throat.   Eyes: Negative.   Respiratory: Positive for cough.   Cardiovascular: Positive for chest pain.  Gastrointestinal: Negative.   Endocrine: Negative.   Genitourinary: Negative.   Musculoskeletal: Negative.   Skin: Negative.   Allergic/Immunologic: Negative.   Neurological: Negative.   Hematological: Negative.   Psychiatric/Behavioral: Negative.     Allergies  Tomato and Pollen extract  Home Medications   Prior to Admission medications   Medication Sig Start Date End Date Taking? Authorizing Provider  azithromycin (ZITHROMAX) 250 MG tablet Take 1 tablet (250 mg total) by mouth daily. Take first 2 tablets together, then 1 every day until  finished. 03/05/16   Lysbeth Penner, FNP  ipratropium (ATROVENT) 0.06 % nasal spray Place 2 sprays into both nostrils 4 (four) times daily. 03/05/16   Lysbeth Penner, FNP   Meds Ordered and Administered this Visit  Medications - No data to display  BP 152/83 (BP Location: Left Arm)   Pulse 66   Temp 98.5 F (36.9 C) (Oral)   Resp 14   SpO2 100%  No data found.   Physical Exam  Constitutional: She is oriented to person, place, and time. She appears well-developed and well-nourished.  HENT:  Head: Normocephalic and atraumatic.  Right Ear: External ear normal.  Left Ear: External ear normal.  Mouth/Throat: Oropharynx is clear and moist.  Eyes: Conjunctivae and EOM are normal. Pupils are equal, round, and reactive to light.  Neck: Normal range of motion. Neck supple.  Cardiovascular: Normal rate, regular rhythm and normal heart sounds.   Pulmonary/Chest: Effort normal and breath sounds normal.  Abdominal: Soft. Bowel sounds are normal.  Musculoskeletal: Normal range of motion.  Neurological: She is alert and oriented to person, place, and time.  Nursing note and vitals reviewed.   Urgent Care Course   Clinical Course     Procedures (including critical care time)  Labs Review Labs Reviewed - No data to display  Imaging Review No results found.   Visual Acuity Review  Right Eye Distance:   Left Eye Distance:   Bilateral Distance:    Right Eye Near:   Left Eye Near:    Bilateral Near:  MDM   1. Acute nasopharyngitis   2. Laryngitis    Zithromax Pak as directed Ipratropium 0.06% nasal spray 2 sprays per nostril qid prn #55ml Push po fluids, rest, tylenol and motrin otc prn as directed for fever, arthralgias, and myalgias.  Follow up prn if sx's continue or persist.    Lysbeth Penner, FNP 03/05/16 1128

## 2016-04-02 ENCOUNTER — Encounter (HOSPITAL_COMMUNITY): Payer: Self-pay | Admitting: Family Medicine

## 2016-04-02 ENCOUNTER — Ambulatory Visit (HOSPITAL_COMMUNITY)
Admission: EM | Admit: 2016-04-02 | Discharge: 2016-04-02 | Disposition: A | Discharge status: Home / Self Care | Payer: 59 | Attending: Family Medicine | Admitting: Family Medicine

## 2016-04-02 DIAGNOSIS — J019 Acute sinusitis, unspecified: Secondary | ICD-10-CM | POA: Diagnosis not present

## 2016-04-02 MED ORDER — GUAIFENESIN-CODEINE 100-10 MG/5ML PO SYRP: 5.0000 mL | ORAL_SOLUTION | Freq: Three times a day (TID) | ORAL | 0 refills | Status: DC | PRN

## 2016-04-02 MED ORDER — DOXYCYCLINE HYCLATE 100 MG PO CAPS: 100.0000 mg | ORAL_CAPSULE | Freq: Two times a day (BID) | ORAL | 0 refills | Status: AC

## 2016-04-02 NOTE — Discharge Instructions (Signed)
You have been diagnosed with acute sinusitis. Do not drink alcohol while taking your cough medicine or operate heavy machinery. Should your symptoms fail to resolve follow up with your primary care provider or return to clinic for further evaluation.

## 2016-04-02 NOTE — ED Provider Notes (Signed)
CSN: [id number]     Arrival date & time 04/02/16  1626 History   First MD Initiated Contact with Patient 04/02/16 1655     Chief Complaint  Patient presents with  . Cough  . Otalgia   (Consider location/radiation/quality/duration/timing/severity/associated sxs/prior Treatment) 41 year old female presents to clinic with three week history of sinus pain and pressure, cough, fatigue, and ear fullness. Patient previously seen in clinic two weeks ago and given a Zpack, patient states she completed her medication but has had no relief. States her cough has gotten worse and has been keeping her from resting at night. She has had no fever, chills, or fatigue. Cough is described as dry and hacking with green sputum. She has no shortness of breath or wheezing.   The history is provided by the patient.  Cough  Associated symptoms: ear pain and rhinorrhea   Associated symptoms: no chills, no fever, no shortness of breath, no sore throat and no wheezing   Otalgia  Associated symptoms: congestion, cough and rhinorrhea   Associated symptoms: no ear discharge, no fever and no sore throat     Past Medical History:  Diagnosis Date  . Seasonal allergic reaction    History reviewed. No pertinent surgical history. History reviewed. No pertinent family history. Social History  Substance Use Topics  . Smoking status: Never Smoker  . Smokeless tobacco: Never Used  . Alcohol use No   OB History    No data available     Review of Systems  Constitutional: Negative for chills, fatigue and fever.  HENT: Positive for congestion, ear pain, rhinorrhea, sinus pain and sinus pressure. Negative for ear discharge and sore throat.   Eyes: Negative.   Respiratory: Positive for cough. Negative for shortness of breath and wheezing.   Cardiovascular: Negative.   Gastrointestinal: Negative.   Musculoskeletal: Negative.   Neurological: Negative.     Allergies  Tomato and Pollen extract  Home Medications    Prior to Admission medications   Medication Sig Start Date End Date Taking? Authorizing Provider  azithromycin (ZITHROMAX) 250 MG tablet Take 1 tablet (250 mg total) by mouth daily. Take first 2 tablets together, then 1 every day until finished. 03/05/16   Lysbeth Penner, FNP  doxycycline (VIBRAMYCIN) 100 MG capsule Take 1 capsule (100 mg total) by mouth 2 (two) times daily. 04/02/16 04/09/16  Barnet Glasgow, NP  guaiFENesin-codeine (ROBITUSSIN AC) 100-10 MG/5ML syrup Take 5 mLs by mouth 3 (three) times daily as needed for cough. 04/02/16   Barnet Glasgow, NP  ipratropium (ATROVENT) 0.06 % nasal spray Place 2 sprays into both nostrils 4 (four) times daily. 03/05/16   Lysbeth Penner, FNP   Meds Ordered and Administered this Visit  Medications - No data to display  BP 122/76   Pulse 80   Temp 99.1 F (37.3 C) (Oral)   Resp 18   SpO2 98%  No data found.   Physical Exam  Constitutional: She is oriented to person, place, and time. She appears well-developed and well-nourished. She has a sickly appearance. No distress.  HENT:  Head: Normocephalic.    Right Ear: Hearing, tympanic membrane and external ear normal.  Left Ear: Hearing, tympanic membrane and external ear normal.  Mouth/Throat: Oropharynx is clear and moist. No oropharyngeal exudate, posterior oropharyngeal edema or posterior oropharyngeal erythema.  Eyes: Pupils are equal, round, and reactive to light. Right eye exhibits no discharge. Left eye exhibits no discharge.  Neck: Normal range of motion. No JVD present.  Cardiovascular: Normal rate and regular rhythm.   Pulmonary/Chest: Effort normal and breath sounds normal. No respiratory distress. She has no wheezes. She has no rhonchi.  Abdominal: Soft. Bowel sounds are normal.  Lymphadenopathy:    She has cervical adenopathy.  Neurological: She is alert and oriented to person, place, and time.  Skin: Skin is warm and dry. Capillary refill takes less than 2 seconds. She is  not diaphoretic. No erythema. No pallor.  Psychiatric: She has a normal mood and affect.  Nursing note and vitals reviewed.   Urgent Care Course   Clinical Course     Procedures (including critical care time)  Labs Review Labs Reviewed - No data to display  Imaging Review No results found.   Visual Acuity Review  Right Eye Distance:   Left Eye Distance:   Bilateral Distance:    Right Eye Near:   Left Eye Near:    Bilateral Near:         MDM   1. Acute sinusitis treated with antibiotics in the past 60 days    Patient given rx for doxycycline for acute sinusitis and a prescription for guaifenesin with codeine for cough. Patient advised to use OTC flonase for congestion, 2 sprays each nostril daily. Should symptoms fail to improve or worsen follow up with PCP or return to clinic.     Barnet Glasgow, NP 04/02/16 1719

## 2016-04-02 NOTE — ED Triage Notes (Signed)
Pt here for continued URI symptoms and right ear pain with popping in ear.

## 2016-05-12 ENCOUNTER — Ambulatory Visit (HOSPITAL_COMMUNITY)
Admission: EM | Admit: 2016-05-12 | Discharge: 2016-05-12 | Disposition: A | Discharge status: Home / Self Care | Payer: 59 | Attending: Internal Medicine | Admitting: Internal Medicine

## 2016-05-12 ENCOUNTER — Encounter (HOSPITAL_COMMUNITY): Payer: Self-pay | Admitting: Emergency Medicine

## 2016-05-12 DIAGNOSIS — J019 Acute sinusitis, unspecified: Secondary | ICD-10-CM

## 2016-05-12 MED ORDER — TRIAMCINOLONE ACETONIDE 55 MCG/ACT NA AERO: 2.0000 | INHALATION_SPRAY | Freq: Every day | NASAL | 0 refills | Status: DC

## 2016-05-12 MED ORDER — AMOXICILLIN-POT CLAVULANATE 875-125 MG PO TABS: 1.0000 | ORAL_TABLET | Freq: Two times a day (BID) | ORAL | 0 refills | Status: AC

## 2016-05-12 MED ORDER — PREDNISONE 50 MG PO TABS: 50.0000 mg | ORAL_TABLET | Freq: Every day | ORAL | 0 refills | Status: DC

## 2016-05-12 NOTE — Discharge Instructions (Addendum)
Prescriptions sent to [facility] on [address].  Recheck for persistent headache (not improving in a few days), new fever >100.5, or increasing phlegm production/nasal discharge.

## 2016-05-12 NOTE — ED Triage Notes (Signed)
The patient presented to the [facility] with a complaint of a headache that started last night.

## 2016-05-12 NOTE — ED Provider Notes (Signed)
[facility]    CSN: [id number] Arrival date & time: 05/12/16  [place]     History   Chief Complaint Chief Complaint  Patient presents with  . Headache    HPI Marilyn Hrabovsky is a 41 y.o. female. Recent medical history notable for recurrent difficulty with severe head congestion, headache. Treated with Zithromax in December, then doxycycline in January. No fever. Sounds very congested, sniffling. No cough, no sore throat. Feels a little bit off balance. No vomiting, no diarrhea. No achiness. Headache is worst when she bends over.    HPI  Past Medical History:  Diagnosis Date  . Seasonal allergic reaction    History reviewed. No pertinent surgical history.    Home Medications    Prior to Admission medications   Medication Sig Start Date End Date Taking? Authorizing Provider  amoxicillin-clavulanate (AUGMENTIN) 875-125 MG tablet Take 1 tablet by mouth 2 (two) times daily. 05/12/16 05/22/16  Sherlene Shams, MD  predniSONE (DELTASONE) 50 MG tablet Take 1 tablet (50 mg total) by mouth daily. 05/12/16   Sherlene Shams, MD  triamcinolone (NASACORT AQ) 55 MCG/ACT AERO nasal inhaler Place 2 sprays into the nose daily. 05/12/16   Sherlene Shams, MD    Family History History reviewed. No pertinent family history.  Social History Social History  Substance Use Topics  . Smoking status: Never Smoker  . Smokeless tobacco: Never Used  . Alcohol use No     Allergies   Tomato and Pollen extract   Review of Systems Review of Systems  All other systems reviewed and are negative.    Physical Exam Triage Vital Signs ED Triage Vitals [05/12/16 1859]  Enc Vitals Group     BP 107/60     Pulse Rate 81     Resp 16     Temp 98.4 F (36.9 C)     Temp Source Oral     SpO2 99 %     Weight      Height      Pain Score 9   Updated Vital Signs BP 107/60 (BP Location: Right Arm)   Pulse 81   Temp 98.4 F (36.9 C) (Oral)   Resp 16   SpO2 99%   Physical Exam    Constitutional: She is oriented to person, place, and time. No distress.  Alert, nicely groomed Looks tired, voice sounds very congested Sniffling  HENT:  Head: Atraumatic.  Bilateral TMs are opaque, no erythema Marked nasal congestion/occlusion bilaterally Throat is pink with postnasal drainage evident  Eyes:  Conjugate gaze, no eye redness/drainage  Neck: Neck supple.  Cardiovascular: Normal rate and regular rhythm.   Pulmonary/Chest: No respiratory distress. She has no wheezes. She has no rales.  Lungs clear, symmetric breath sounds  Abdominal: She exhibits no distension.  Musculoskeletal: Normal range of motion.  No leg swelling  Neurological: She is alert and oriented to person, place, and time.  Face is symmetric, speech is clear/coherent Was able to drive herself to the urgent care, walked into the facility independently. Able to climb on/off exam table  Skin: Skin is warm and dry.  No cyanosis  Nursing note and vitals reviewed.    UC Treatments / Results   Procedures Procedures (including critical care time) None today   Final Clinical Impressions(s) / UC Diagnoses   Final diagnoses:  Acute sinusitis treated with antibiotics in the past 60 days   Prescriptions sent to [facility] on [address].  Recheck for persistent headache (not improving  in a few days), new fever >100.5, or increasing phlegm production/nasal discharge.    New Prescriptions New Prescriptions   AMOXICILLIN-CLAVULANATE (AUGMENTIN) 875-125 MG TABLET    Take 1 tablet by mouth 2 (two) times daily.   PREDNISONE (DELTASONE) 50 MG TABLET    Take 1 tablet (50 mg total) by mouth daily.   TRIAMCINOLONE (NASACORT AQ) 55 MCG/ACT AERO NASAL INHALER    Place 2 sprays into the nose daily.     Sherlene Shams, MD 05/13/16 [phone]

## 2016-05-15 ENCOUNTER — Ambulatory Visit (HOSPITAL_COMMUNITY)
Admission: EM | Admit: 2016-05-15 | Discharge: 2016-05-15 | Disposition: A | Discharge status: Home / Self Care | Payer: 59 | Attending: Family Medicine | Admitting: Family Medicine

## 2016-05-15 ENCOUNTER — Encounter (HOSPITAL_COMMUNITY): Payer: Self-pay | Admitting: Emergency Medicine

## 2016-05-15 DIAGNOSIS — G43001 Migraine without aura, not intractable, with status migrainosus: Secondary | ICD-10-CM

## 2016-05-15 DIAGNOSIS — G44209 Tension-type headache, unspecified, not intractable: Secondary | ICD-10-CM

## 2016-05-15 DIAGNOSIS — M542 Cervicalgia: Secondary | ICD-10-CM | POA: Diagnosis not present

## 2016-05-15 MED ORDER — DEXAMETHASONE SODIUM PHOSPHATE 10 MG/ML IJ SOLN
10.0000 mg | Freq: Once | INTRAMUSCULAR | Status: AC
  Administered 2016-05-15: 10 mg via INTRAMUSCULAR

## 2016-05-15 MED ORDER — KETOROLAC TROMETHAMINE 60 MG/2ML IM SOLN
60.0000 mg | Freq: Once | INTRAMUSCULAR | Status: AC
  Administered 2016-05-15: 60 mg via INTRAMUSCULAR

## 2016-05-15 MED ORDER — NAPROXEN 500 MG PO TABS
500.0000 mg | ORAL_TABLET | Freq: Two times a day (BID) | ORAL | 0 refills | Status: DC
Start: 2016-05-15 — End: 2016-10-21

## 2016-05-15 MED ORDER — KETOROLAC TROMETHAMINE 60 MG/2ML IM SOLN
INTRAMUSCULAR | Status: AC
  Filled 2016-05-15: qty 2

## 2016-05-15 MED ORDER — METHOCARBAMOL 500 MG PO TABS: 500.0000 mg | ORAL_TABLET | Freq: Two times a day (BID) | ORAL | 0 refills | Status: DC

## 2016-05-15 MED ORDER — DEXAMETHASONE SODIUM PHOSPHATE 10 MG/ML IJ SOLN
INTRAMUSCULAR | Status: AC
  Filled 2016-05-15: qty 1

## 2016-05-15 NOTE — ED Triage Notes (Signed)
The patient presented with a complaint of a headache. The patient was evaluated for the same complaint on 05/12/2016.

## 2016-05-15 NOTE — ED Provider Notes (Signed)
CSN: [id number]     Arrival date & time 05/15/16  1239 History   None    Chief Complaint  Patient presents with  . Headache   (Consider location/radiation/quality/duration/timing/severity/associated sxs/prior Treatment) Patient presents with c/o headache that wraps around head for 2 days.  She is also having migraine sx's with this such as phonophotophobia, and throbbing cephalgia. She was seen and treated for sinusitis recently.     The history is provided by the patient.  Headache  Pain location:  Generalized Severity currently:  7/10 Severity at highest:  9/10 Onset quality:  Gradual Duration:  2 days Timing:  Constant Progression:  Worsening Chronicity:  New Similar to prior headaches: no   Context: bright light   Relieved by:  Nothing   Past Medical History:  Diagnosis Date  . Seasonal allergic reaction    History reviewed. No pertinent surgical history. History reviewed. No pertinent family history. Social History  Substance Use Topics  . Smoking status: Never Smoker  . Smokeless tobacco: Never Used  . Alcohol use No   OB History    No data available     Review of Systems  Constitutional: Negative.   HENT: Negative.   Eyes: Negative.   Respiratory: Negative.   Cardiovascular: Negative.   Endocrine: Negative.   Genitourinary: Negative.   Musculoskeletal: Negative.   Neurological: Positive for headaches.  Hematological: Negative.   Psychiatric/Behavioral: Negative.     Allergies  Tomato and Pollen extract  Home Medications   Prior to Admission medications   Medication Sig Start Date End Date Taking? Authorizing Provider  amoxicillin-clavulanate (AUGMENTIN) 875-125 MG tablet Take 1 tablet by mouth 2 (two) times daily. 05/12/16 05/22/16 Yes Sherlene Shams, MD  predniSONE (DELTASONE) 50 MG tablet Take 1 tablet (50 mg total) by mouth daily. 05/12/16  Yes Sherlene Shams, MD  triamcinolone (NASACORT AQ) 55 MCG/ACT AERO nasal inhaler Place 2 sprays into the  nose daily. 05/12/16  Yes Sherlene Shams, MD  methocarbamol (ROBAXIN) 500 MG tablet Take 1 tablet (500 mg total) by mouth 2 (two) times daily. 05/15/16   Lysbeth Penner, FNP  naproxen (NAPROSYN) 500 MG tablet Take 1 tablet (500 mg total) by mouth 2 (two) times daily with a meal. 05/15/16   Lysbeth Penner, FNP   Meds Ordered and Administered this Visit   Medications  ketorolac (TORADOL) injection 60 mg (not administered)  dexamethasone (DECADRON) injection 10 mg (not administered)    BP 119/59 (BP Location: Right Arm)   Pulse 88   Temp 98.7 F (37.1 C) (Oral)   Resp 20   SpO2 97%  No data found.   Physical Exam  Constitutional: She is oriented to person, place, and time. She appears well-developed.  HENT:  Head: Normocephalic and atraumatic.  Right Ear: External ear normal.  Left Ear: External ear normal.  Mouth/Throat: Oropharynx is clear and moist.  Eyes: Conjunctivae and EOM are normal. Pupils are equal, round, and reactive to light.  Neck: Normal range of motion. Neck supple.  Cardiovascular: Normal rate, regular rhythm and normal heart sounds.   Pulmonary/Chest: Effort normal.  Musculoskeletal: She exhibits tenderness.  TTP myofascial region and cervical paraspinous muscles  Neurological: She is alert and oriented to person, place, and time.  Nursing note and vitals reviewed.   Urgent Care Course     Procedures (including critical care time)  Labs Review Labs Reviewed - No data to display  Imaging Review No results found.   Visual Acuity Review  Right Eye Distance:   Left Eye Distance:   Bilateral Distance:    Right Eye Near:   Left Eye Near:    Bilateral Near:         MDM   1. Tension headache   2. Migraine without aura and with status migrainosus, not intractable   3. Cervicalgia    Toradol 60mg  IM now Decadron 10mg  IM now Robaxin 500mg  po bid prn #20 Naprosyn 500mg  po bid x 10 days  Follow up with [place],  FNP 05/15/16 [phone]

## 2016-05-17 ENCOUNTER — Encounter (HOSPITAL_COMMUNITY): Payer: Self-pay | Admitting: Emergency Medicine

## 2016-05-17 ENCOUNTER — Ambulatory Visit (HOSPITAL_COMMUNITY)
Admission: EM | Admit: 2016-05-17 | Discharge: 2016-05-17 | Disposition: A | Discharge status: Home / Self Care | Payer: 59 | Attending: Family Medicine | Admitting: Family Medicine

## 2016-05-17 DIAGNOSIS — G43009 Migraine without aura, not intractable, without status migrainosus: Secondary | ICD-10-CM | POA: Diagnosis not present

## 2016-05-17 MED ORDER — KETOROLAC TROMETHAMINE 60 MG/2ML IM SOLN
30.0000 mg | Freq: Once | INTRAMUSCULAR | Status: AC
  Administered 2016-05-17: 30 mg via INTRAMUSCULAR

## 2016-05-17 MED ORDER — METOCLOPRAMIDE HCL 5 MG/ML IJ SOLN
5.0000 mg | Freq: Once | INTRAMUSCULAR | Status: AC
  Administered 2016-05-17: 18:00:00 via INTRAMUSCULAR

## 2016-05-17 MED ORDER — DEXAMETHASONE SODIUM PHOSPHATE 10 MG/ML IJ SOLN
INTRAMUSCULAR | Status: AC
  Filled 2016-05-17: qty 1

## 2016-05-17 MED ORDER — DEXAMETHASONE SODIUM PHOSPHATE 10 MG/ML IJ SOLN
10.0000 mg | Freq: Once | INTRAMUSCULAR | Status: AC
  Administered 2016-05-17: 10 mg via INTRAMUSCULAR

## 2016-05-17 MED ORDER — SUMATRIPTAN SUCCINATE 6 MG/0.5ML ~~LOC~~ SOLN: 6.0000 mg | Freq: Once | SUBCUTANEOUS | Status: DC

## 2016-05-17 MED ORDER — METOCLOPRAMIDE HCL 5 MG/ML IJ SOLN
INTRAMUSCULAR | Status: AC
  Filled 2016-05-17: qty 2

## 2016-05-17 MED ORDER — KETOROLAC TROMETHAMINE 30 MG/ML IJ SOLN
INTRAMUSCULAR | Status: AC
  Filled 2016-05-17: qty 1

## 2016-05-17 NOTE — Discharge Instructions (Signed)
You have been treated tonight with a headache cocktail of Toradol, and dexamethasone, and Reglan. I have opted not to initiate any further prescription medication as you still have medication from her previous visit 2 days ago. I would highly recommend he establish with a primary care provider to manage your headaches. You may require a referral to a neurologist for further evaluation of your headaches due to the age of onset. This is a concerning sign and symptom.

## 2016-05-17 NOTE — ED Triage Notes (Signed)
Pt c/o persistent intermittent HA onset 05/12/16  Seen here on 2/14 for similar sx  Sx increases w/bright lights and loud noises.   Voices no other concerns... A&O x4... NAD

## 2016-05-17 NOTE — ED Provider Notes (Signed)
CSN: [id number]     Arrival date & time 05/17/16  1640 History   First MD Initiated Contact with Patient 05/17/16 1729     Chief Complaint  Patient presents with  . Migraine   (Consider location/radiation/quality/duration/timing/severity/associated sxs/prior Treatment) 41 year old female presents to clinic with chief complaint of migraine. Was seen here in clinic 2 days ago, treated with IM Toradol, and dexamethasone. States she received relief with these medications however she is currently experiencing similar symptoms again. She has been taking the Skelaxin that was prescribed at her previous visit. Pain is unilateral to the right side radiating from her temple to the occipital region, she does have light sensitivity, denies any neck stiffness, describes her pain as a dull ache 8. She denies any nausea or vomiting she denies any fever, denies history of trauma.   The history is provided by the patient.  Migraine     Past Medical History:  Diagnosis Date  . Seasonal allergic reaction    History reviewed. No pertinent surgical history. History reviewed. No pertinent family history. Social History  Substance Use Topics  . Smoking status: Never Smoker  . Smokeless tobacco: Never Used  . Alcohol use No   OB History    No data available     Review of Systems  Allergies  Tomato and Pollen extract  Home Medications   Prior to Admission medications   Medication Sig Start Date End Date Taking? Authorizing Provider  methocarbamol (ROBAXIN) 500 MG tablet Take 1 tablet (500 mg total) by mouth 2 (two) times daily. 05/15/16  Yes Lysbeth Penner, FNP  naproxen (NAPROSYN) 500 MG tablet Take 1 tablet (500 mg total) by mouth 2 (two) times daily with a meal. 05/15/16  Yes Lysbeth Penner, FNP  amoxicillin-clavulanate (AUGMENTIN) 875-125 MG tablet Take 1 tablet by mouth 2 (two) times daily. 05/12/16 05/22/16  Sherlene Shams, MD  predniSONE (DELTASONE) 50 MG tablet Take 1 tablet (50 mg total)  by mouth daily. 05/12/16   Sherlene Shams, MD  triamcinolone (NASACORT AQ) 55 MCG/ACT AERO nasal inhaler Place 2 sprays into the nose daily. 05/12/16   Sherlene Shams, MD   Meds Ordered and Administered this Visit   Medications  ketorolac (TORADOL) injection 30 mg (30 mg Intramuscular Given 05/17/16 1753)  metoCLOPramide (REGLAN) injection 5 mg ( Intramuscular Given 05/17/16 1756)  dexamethasone (DECADRON) injection 10 mg (10 mg Intramuscular Given 05/17/16 1800)    BP 117/56 (BP Location: Left Arm)   Pulse 71   Temp 98.8 F (37.1 C) (Oral)   Resp 18   LMP 04/20/2016   SpO2 100%  No data found.   Physical Exam  Constitutional: She is oriented to person, place, and time. She appears well-developed and well-nourished.  HENT:  Head: Normocephalic.  Right Ear: External ear normal.  Left Ear: External ear normal.  Eyes: EOM are normal. Pupils are equal, round, and reactive to light.  Neck: Normal range of motion and full passive range of motion without pain. Neck supple. No JVD present. No Brudzinski's sign and no Kernig's sign noted.  Pulmonary/Chest: Effort normal.  Lymphadenopathy:    She has no cervical adenopathy.  Neurological: She is alert and oriented to person, place, and time. No cranial nerve deficit. Coordination normal.  Skin: Skin is warm and dry. Capillary refill takes less than 2 seconds. She is not diaphoretic.  Psychiatric: She has a normal mood and affect.  Nursing note and vitals reviewed.   Urgent Care Course  Procedures (including critical care time)  Labs Review Labs Reviewed - No data to display  Imaging Review No results found.   Visual Acuity Review  Right Eye Distance:   Left Eye Distance:   Bilateral Distance:    Right Eye Near:   Left Eye Near:    Bilateral Near:         MDM   1. Migraine without aura and without status migrainosus, not intractable   You have been treated tonight with a headache cocktail of Toradol, and  dexamethasone, and Reglan. I have opted not to initiate any further prescription medication as you still have medication from her previous visit 2 days ago. I would highly recommend he establish with a primary care provider to manage your headaches. You may require a referral to a neurologist for further evaluation of your headaches due to the age of onset. This is a concerning sign and symptom.      Barnet Glasgow, NP 05/17/16 [phone]

## 2016-05-24 ENCOUNTER — Ambulatory Visit (HOSPITAL_COMMUNITY)
Admission: EM | Admit: 2016-05-24 | Discharge: 2016-05-24 | Disposition: A | Discharge status: Home / Self Care | Payer: 59 | Attending: Family Medicine | Admitting: Family Medicine

## 2016-05-24 ENCOUNTER — Encounter (HOSPITAL_COMMUNITY): Payer: Self-pay

## 2016-05-24 DIAGNOSIS — J0101 Acute recurrent maxillary sinusitis: Secondary | ICD-10-CM

## 2016-05-24 MED ORDER — IPRATROPIUM BROMIDE 0.06 % NA SOLN: 2.0000 | Freq: Four times a day (QID) | NASAL | 2 refills | Status: DC

## 2016-05-24 MED ORDER — AZITHROMYCIN 250 MG PO TABS: ORAL_TABLET | ORAL | 0 refills | Status: DC

## 2016-05-24 NOTE — ED Triage Notes (Signed)
Pt here for sinus pressure and pain in her eyes, nasal congestion since last night. Took tylenol, no fever. Along with headache.

## 2016-05-24 NOTE — ED Provider Notes (Signed)
[facility]    CSN: [id number] Arrival date & time: 05/24/16  1010     History   Chief Complaint Chief Complaint  Patient presents with  . Facial Pain    HPI Connie Underwood is a 41 y.o. female.   The history is provided by the patient.  URI  Presenting symptoms: congestion, facial pain and rhinorrhea   Presenting symptoms: no fever and no sore throat   Severity:  Mild Onset quality:  Gradual Duration:  1 day Progression:  Worsening Chronicity:  New Relieved by:  None tried Worsened by:  Nothing Ineffective treatments:  None tried Associated symptoms: headaches and sinus pain   Risk factors: recent illness   Risk factors comment:  Migraine on 2/16.   Past Medical History:  Diagnosis Date  . Seasonal allergic reaction     There are no active problems to display for this patient.   History reviewed. No pertinent surgical history.  OB History    No data available       Home Medications    Prior to Admission medications   Medication Sig Start Date End Date Taking? Authorizing Provider  azithromycin (ZITHROMAX Z-PAK) 250 MG tablet Take as directed on pack 05/24/16   Billy Fischer, MD  ipratropium (ATROVENT) 0.06 % nasal spray Place 2 sprays into both nostrils 4 (four) times daily. 05/24/16   Billy Fischer, MD  methocarbamol (ROBAXIN) 500 MG tablet Take 1 tablet (500 mg total) by mouth 2 (two) times daily. 05/15/16   Lysbeth Penner, FNP  naproxen (NAPROSYN) 500 MG tablet Take 1 tablet (500 mg total) by mouth 2 (two) times daily with a meal. 05/15/16   Lysbeth Penner, FNP  predniSONE (DELTASONE) 50 MG tablet Take 1 tablet (50 mg total) by mouth daily. 05/12/16   Sherlene Shams, MD  triamcinolone (NASACORT AQ) 55 MCG/ACT AERO nasal inhaler Place 2 sprays into the nose daily. 05/12/16   Sherlene Shams, MD    Family History No family history on file.  Social History Social History  Substance Use Topics  . Smoking status: Never Smoker  . Smokeless  tobacco: Never Used  . Alcohol use No     Allergies   Tomato and Pollen extract   Review of Systems Review of Systems  Constitutional: Negative for fever.  HENT: Positive for congestion, postnasal drip, rhinorrhea, sinus pain and sinus pressure. Negative for sore throat.   Neurological: Positive for headaches.  All other systems reviewed and are negative.    Physical Exam Triage Vital Signs ED Triage Vitals [05/24/16 1046]  Enc Vitals Group     BP 104/73     Pulse Rate 69     Resp 20     Temp 98.4 F (36.9 C)     Temp src      SpO2 99 %     Weight      Height      Head Circumference      Peak Flow      Pain Score 7     Pain Loc      Pain Edu?      Excl. in [place]?    No data found.   Updated Vital Signs BP 104/73 (BP Location: Right Arm)   Pulse 69   Temp 98.4 F (36.9 C)   Resp 20   LMP 04/22/2016 (Exact Date)   SpO2 99%   Visual Acuity Right Eye Distance:   Left Eye Distance:  Bilateral Distance:    Right Eye Near:   Left Eye Near:    Bilateral Near:     Physical Exam  Constitutional: She appears well-developed and well-nourished. No distress.  HENT:  Right Ear: External ear normal.  Left Ear: External ear normal.  Nose: Nose normal.  Mouth/Throat: Oropharynx is clear and moist.  Eyes: Pupils are equal, round, and reactive to light.  Neck: Normal range of motion.  Cardiovascular: Normal rate, regular rhythm, normal heart sounds and intact distal pulses.   Pulmonary/Chest: Effort normal and breath sounds normal.  Lymphadenopathy:    She has no cervical adenopathy.  Skin: Skin is warm and dry.  Nursing note and vitals reviewed.    UC Treatments / Results  Labs (all labs ordered are listed, but only abnormal results are displayed) Labs Reviewed - No data to display  EKG  EKG Interpretation None       Radiology No results found.  Procedures Procedures (including critical care time)  Medications Ordered in UC Medications - No  data to display   Initial Impression / Assessment and Plan / UC Course  I have reviewed the triage vital signs and the nursing notes.  Pertinent labs & imaging results that were available during my care of the patient were reviewed by me and considered in my medical decision making (see chart for details).       Final Clinical Impressions(s) / UC Diagnoses   Final diagnoses:  Acute recurrent maxillary sinusitis    New Prescriptions New Prescriptions   AZITHROMYCIN (ZITHROMAX Z-PAK) 250 MG TABLET    Take as directed on pack   IPRATROPIUM (ATROVENT) 0.06 % NASAL SPRAY    Place 2 sprays into both nostrils 4 (four) times daily.     Billy Fischer, MD 05/24/16 [phone]

## 2016-06-05 DIAGNOSIS — G43709 Chronic migraine without aura, not intractable, without status migrainosus: Secondary | ICD-10-CM | POA: Diagnosis not present

## 2016-06-06 DIAGNOSIS — Z719 Counseling, unspecified: Secondary | ICD-10-CM | POA: Diagnosis not present

## 2016-06-13 DIAGNOSIS — Z719 Counseling, unspecified: Secondary | ICD-10-CM | POA: Diagnosis not present

## 2016-06-20 DIAGNOSIS — G43019 Migraine without aura, intractable, without status migrainosus: Secondary | ICD-10-CM | POA: Diagnosis not present

## 2016-06-25 DIAGNOSIS — Z719 Counseling, unspecified: Secondary | ICD-10-CM | POA: Diagnosis not present

## 2016-06-27 DIAGNOSIS — Z719 Counseling, unspecified: Secondary | ICD-10-CM | POA: Diagnosis not present

## 2016-07-04 DIAGNOSIS — Z719 Counseling, unspecified: Secondary | ICD-10-CM | POA: Diagnosis not present

## 2016-08-15 DIAGNOSIS — Z719 Counseling, unspecified: Secondary | ICD-10-CM | POA: Diagnosis not present

## 2016-10-21 ENCOUNTER — Encounter (HOSPITAL_COMMUNITY): Payer: Self-pay | Admitting: Emergency Medicine

## 2016-10-21 ENCOUNTER — Ambulatory Visit (HOSPITAL_COMMUNITY)
Admission: EM | Admit: 2016-10-21 | Discharge: 2016-10-21 | Disposition: A | Discharge status: Home / Self Care | Payer: 59

## 2016-10-21 DIAGNOSIS — J320 Chronic maxillary sinusitis: Secondary | ICD-10-CM | POA: Diagnosis not present

## 2016-10-21 MED ORDER — AMOXICILLIN 875 MG PO TABS: 875.0000 mg | ORAL_TABLET | Freq: Two times a day (BID) | ORAL | 0 refills | Status: DC

## 2016-10-21 MED ORDER — METHYLPREDNISOLONE 4 MG PO TBPK: ORAL_TABLET | ORAL | 0 refills | Status: DC

## 2016-10-21 MED ORDER — FLUCONAZOLE 150 MG PO TABS: 150.0000 mg | ORAL_TABLET | Freq: Every day | ORAL | 0 refills | Status: DC

## 2016-10-21 NOTE — ED Triage Notes (Signed)
The patient presented to the [facility] with a complaint of sinus pain and pressure x 1 week. 

## 2016-10-21 NOTE — ED Provider Notes (Signed)
CSN: [id number]     Arrival date & time 10/21/16  1934 History   None    Chief Complaint  Patient presents with  . Facial Pain   (Consider location/radiation/quality/duration/timing/severity/associated sxs/prior Treatment) Patient c/o sinus pressure and URI sx's   The history is provided by the patient.  URI  Presenting symptoms: congestion, fatigue and sore throat   Severity:  Moderate Onset quality:  Sudden Duration:  1 week Timing:  Constant Progression:  Worsening Chronicity:  Chronic Relieved by:  Nothing   Past Medical History:  Diagnosis Date  . Seasonal allergic reaction    History reviewed. No pertinent surgical history. History reviewed. No pertinent family history. Social History  Substance Use Topics  . Smoking status: Never Smoker  . Smokeless tobacco: Never Used  . Alcohol use No   OB History    No data available     Review of Systems  Constitutional: Positive for fatigue.  HENT: Positive for congestion and sore throat.   Eyes: Negative.   Respiratory: Negative.   Cardiovascular: Negative.   Gastrointestinal: Negative.   Endocrine: Negative.   Genitourinary: Negative.   Musculoskeletal: Negative.   Allergic/Immunologic: Negative.   Neurological: Negative.   Hematological: Negative.   Psychiatric/Behavioral: Negative.     Allergies  Tomato and Pollen extract  Home Medications   Prior to Admission medications   Medication Sig Start Date End Date Taking? Authorizing Provider  escitalopram (LEXAPRO) 10 MG tablet Take 10 mg by mouth daily.   Yes [provider]  amoxicillin (AMOXIL) 875 MG tablet Take 1 tablet (875 mg total) by mouth 2 (two) times daily. 10/21/16   Lysbeth Penner, FNP  fluconazole (DIFLUCAN) 150 MG tablet Take 1 tablet (150 mg total) by mouth daily. 10/21/16   Lysbeth Penner, FNP  methylPREDNISolone (MEDROL DOSEPAK) 4 MG TBPK tablet Take 6-5-4-3-2-1 po qd 10/21/16   Lysbeth Penner, FNP   Meds Ordered and  Administered this Visit  Medications - No data to display  BP 118/86 (BP Location: Right Arm)   Pulse 95   Temp 98.6 F (37 C) (Oral)   Resp 18   SpO2 100%  No data found.   Physical Exam  Constitutional: She appears well-developed and well-nourished.  HENT:  Head: Normocephalic and atraumatic.  Right Ear: External ear normal.  Left Ear: External ear normal.  Mouth/Throat: Oropharynx is clear and moist.  Eyes: Pupils are equal, round, and reactive to light. Conjunctivae and EOM are normal.  Neck: Normal range of motion. Neck supple.  Cardiovascular: Normal rate, regular rhythm and normal heart sounds.   Pulmonary/Chest: Effort normal and breath sounds normal.  Abdominal: Soft. Bowel sounds are normal.  Nursing note and vitals reviewed.   Urgent Care Course     Procedures (including critical care time)  Labs Review Labs Reviewed - No data to display  Imaging Review No results found.   Visual Acuity Review  Right Eye Distance:   Left Eye Distance:   Bilateral Distance:    Right Eye Near:   Left Eye Near:    Bilateral Near:         MDM   1. Chronic maxillary sinusitis    Amoxicillin 875mg  one po bid x10 days #20 Medrol dose pack as directed Diflucan  Push po fluids, rest, tylenol and motrin otc prn as directed for fever, arthralgias, and myalgias.  Follow up prn if sx's continue or persist.    Lysbeth Penner, FNP 10/21/16 2018

## 2016-12-05 ENCOUNTER — Encounter (HOSPITAL_COMMUNITY): Payer: Self-pay | Admitting: Nurse Practitioner

## 2016-12-05 ENCOUNTER — Ambulatory Visit (HOSPITAL_COMMUNITY)
Admission: EM | Admit: 2016-12-05 | Discharge: 2016-12-05 | Disposition: A | Discharge status: Home / Self Care | Payer: 59 | Attending: Internal Medicine | Admitting: Internal Medicine

## 2016-12-05 DIAGNOSIS — J069 Acute upper respiratory infection, unspecified: Secondary | ICD-10-CM

## 2016-12-05 DIAGNOSIS — J3489 Other specified disorders of nose and nasal sinuses: Secondary | ICD-10-CM

## 2016-12-05 MED ORDER — IPRATROPIUM BROMIDE 0.06 % NA SOLN: 2.0000 | Freq: Four times a day (QID) | NASAL | 0 refills | Status: DC

## 2016-12-05 MED ORDER — PREDNISONE 50 MG PO TABS: ORAL_TABLET | ORAL | 0 refills | Status: DC

## 2016-12-05 NOTE — Discharge Instructions (Signed)
Sudafed PE 10 mg every 4 to 6 hours as needed for congestion or if this decongestant is not working you may try the pseudoephedrine decongestants that she sign for behind the counter. This can cause increase in pulse rate, blood pressure and jitteriness. Allegra or Zyrtec daily as needed for drainage and runny nose. For stronger antihistamine may take Chlor-Trimeton 2 to 4 mg every 4 to 6 hours, may cause drowsiness. Saline nasal spray used frequently. Drink plenty of fluids and stay well-hydrated.

## 2016-12-05 NOTE — ED Provider Notes (Addendum)
[place]    CSN: [id number] Arrival date & time: 12/05/16  1440     History   Chief Complaint Chief Complaint  Patient presents with  . URI    HPI Connie Underwood is a 41 y.o. female.   41 year old female presents to the urgent care with primarily upper respiratory congestion, sinus pressure, nasal congestion, PND, nasal drainage and pressure in the maxillary sinuses. Denies fever. She is taken several OTC medications without significant relief.      Past Medical History:  Diagnosis Date  . Seasonal allergic reaction     There are no active problems to display for this patient.   History reviewed. No pertinent surgical history.  OB History    No data available       Home Medications    Prior to Admission medications   Medication Sig Start Date End Date Taking? Authorizing Provider  escitalopram (LEXAPRO) 10 MG tablet Take 10 mg by mouth daily.    [provider]  ipratropium (ATROVENT) 0.06 % nasal spray Place 2 sprays into both nostrils 4 (four) times daily. 12/05/16   Janne Napoleon, NP  predniSONE (DELTASONE) 50 MG tablet 1 tab po daily for 6 days. Take with food. 12/05/16   Janne Napoleon, NP    Family History History reviewed. No pertinent family history.  Social History Social History  Substance Use Topics  . Smoking status: Never Smoker  . Smokeless tobacco: Never Used  . Alcohol use No     Allergies   Tomato and Pollen extract   Review of Systems Review of Systems  Constitutional: Negative for activity change, appetite change, chills, fatigue and fever.  HENT: Positive for congestion, postnasal drip, rhinorrhea and sinus pressure. Negative for ear pain and facial swelling.   Eyes: Negative.   Respiratory: Positive for cough. Negative for shortness of breath.   Cardiovascular: Negative.   Musculoskeletal: Negative for neck pain and neck stiffness.  Skin: Negative for pallor and rash.  Neurological: Negative.   All other  systems reviewed and are negative.    Physical Exam Triage Vital Signs ED Triage Vitals  Enc Vitals Group     BP 12/05/16 1500 129/69     Pulse Rate 12/05/16 1500 87     Resp 12/05/16 1500 16     Temp 12/05/16 1500 98.9 F (37.2 C)     Temp Source 12/05/16 1500 Oral     SpO2 12/05/16 1500 100 %     Weight --      Height --      Head Circumference --      Peak Flow --      Pain Score 12/05/16 1503 7     Pain Loc --      Pain Edu? --      Excl. in [place]? --    No data found.   Updated Vital Signs BP 129/69   Pulse 87   Temp 98.9 F (37.2 C) (Oral)   Resp 16   SpO2 100%   Visual Acuity Right Eye Distance:   Left Eye Distance:   Bilateral Distance:    Right Eye Near:   Left Eye Near:    Bilateral Near:     Physical Exam  Constitutional: She is oriented to person, place, and time. She appears well-developed and well-nourished. No distress.  HENT:  Bilateral TMs are normal. Oropharynx with minor erythema. No exudate.  Eyes: EOM are normal.  Neck: Normal range of motion. Neck supple.  Cardiovascular: Normal rate, regular rhythm, normal heart sounds and intact distal pulses.   Pulmonary/Chest: Effort normal and breath sounds normal. No respiratory distress. She has no wheezes. She has no rales.  Musculoskeletal: Normal range of motion. She exhibits no edema.  Lymphadenopathy:    She has no cervical adenopathy.  Neurological: She is alert and oriented to person, place, and time.  Skin: Skin is warm and dry. No rash noted.  Psychiatric: She has a normal mood and affect.  Nursing note and vitals reviewed.    UC Treatments / Results  Labs (all labs ordered are listed, but only abnormal results are displayed) Labs Reviewed - No data to display  EKG  EKG Interpretation None       Radiology No results found.  Procedures Procedures (including critical care time)  Medications Ordered in UC Medications - No data to display   Initial Impression /  Assessment and Plan / UC Course  I have reviewed the triage vital signs and the nursing notes.  Pertinent labs & imaging results that were available during my care of the patient were reviewed by me and considered in my medical decision making (see chart for details).    Sudafed PE 10 mg every 4 to 6 hours as needed for congestion or if this decongestant is not working you may try the pseudoephedrine decongestants that she sign for behind the counter. This can cause increase in pulse rate, blood pressure and jitteriness. Allegra or Zyrtec daily as needed for drainage and runny nose. For stronger antihistamine may take Chlor-Trimeton 2 to 4 mg every 4 to 6 hours, may cause drowsiness. Saline nasal spray used frequently. Drink plenty of fluids and stay well-hydrated.   Final Clinical Impressions(s) / UC Diagnoses   Final diagnoses:  Viral upper respiratory tract infection  Sinus pressure  Rhinorrhea    New Prescriptions New Prescriptions   IPRATROPIUM (ATROVENT) 0.06 % NASAL SPRAY    Place 2 sprays into both nostrils 4 (four) times daily.   PREDNISONE (DELTASONE) 50 MG TABLET    1 tab po daily for 6 days. Take with food.     Controlled Substance Prescriptions  Controlled Substance Registry consulted? Not Applicable  Discharged by the provider at 1533 hrs. Janne Napoleon, NP 12/05/16 1536    Janne Napoleon, NP 12/05/16 1547

## 2016-12-05 NOTE — ED Triage Notes (Signed)
Pt presents with URI symptoms. Her symptoms began about five days ago and she's felt increasingly worse since onset. She c/o headache, facial pressure, nasal congestion, sneezing, dry cough. She denies fevers, chills. She has tried sudafed, zyrtec, tylenol cold and sinus at home with no relief.

## 2017-02-25 ENCOUNTER — Ambulatory Visit (HOSPITAL_COMMUNITY)
Admission: EM | Admit: 2017-02-25 | Discharge: 2017-02-25 | Disposition: A | Discharge status: Home / Self Care | Payer: 59 | Attending: Family Medicine | Admitting: Family Medicine

## 2017-02-25 ENCOUNTER — Encounter (HOSPITAL_COMMUNITY): Payer: Self-pay | Admitting: Family Medicine

## 2017-02-25 DIAGNOSIS — R059 Cough, unspecified: Secondary | ICD-10-CM

## 2017-02-25 DIAGNOSIS — R05 Cough: Secondary | ICD-10-CM

## 2017-02-25 DIAGNOSIS — J0100 Acute maxillary sinusitis, unspecified: Secondary | ICD-10-CM | POA: Diagnosis not present

## 2017-02-25 MED ORDER — AMOXICILLIN 875 MG PO TABS: 875.0000 mg | ORAL_TABLET | Freq: Two times a day (BID) | ORAL | 0 refills | Status: DC

## 2017-02-25 MED ORDER — HYDROCODONE-HOMATROPINE 5-1.5 MG/5ML PO SYRP: 5.0000 mL | ORAL_SOLUTION | Freq: Four times a day (QID) | ORAL | 0 refills | Status: DC | PRN

## 2017-02-25 NOTE — ED Provider Notes (Signed)
[place]   [id number] 02/25/17 Arrival Time: 2423   SUBJECTIVE:  Connie Underwood is a 41 y.o. female who presents to the urgent care with complaint of dry cough. PT then developed drainage. PT reports facial pressure. PT takes sudafed at home.  Onset 10 days ago.     Past Medical History:  Diagnosis Date  . Seasonal allergic reaction    No family history on file. Social History   Socioeconomic History  . Marital status: Single    Spouse name: Not on file  . Number of children: Not on file  . Years of education: Not on file  . Highest education level: Not on file  Social Needs  . Financial resource strain: Not on file  . Food insecurity - worry: Not on file  . Food insecurity - inability: Not on file  . Transportation needs - medical: Not on file  . Transportation needs - non-medical: Not on file  Occupational History  . Not on file  Tobacco Use  . Smoking status: Never Smoker  . Smokeless tobacco: Never Used  Substance and Sexual Activity  . Alcohol use: No  . Drug use: No  . Sexual activity: Not on file  Other Topics Concern  . Not on file  Social History Narrative  . Not on file   No outpatient medications have been marked as taking for the 02/25/17 encounter [facility] Encounter).   Allergies  Allergen Reactions  . Tomato     Hives  . Pollen Extract Other (See Comments)    Seasonal allergies       ROS: As per HPI, remainder of ROS negative.   OBJECTIVE:   Vitals:   02/25/17 1637 02/25/17 1638  BP:  116/84  Pulse:  79  Resp:  16  Temp:  99 F (37.2 C)  TempSrc:  Oral  SpO2:  97%  Weight: 240 lb (108.9 kg)   Height: 5\' 4"  (1.626 m)      General appearance: alert; no distress Eyes: PERRL; EOMI; conjunctiva normal HENT: normocephalic; atraumatic; TMs normal, canal normal, external ears normal without trauma; nasal mucosa normal; oral mucosa normal Neck: supple Lungs: clear to auscultation bilaterally Heart: regular rate  and rhythm Back: no CVA tenderness Extremities: no cyanosis or edema; symmetrical with no gross deformities Skin: warm and dry Neurologic: normal gait; grossly normal Psychological: alert and cooperative; normal mood and affect      Labs:  Results for orders placed or performed during the hospital encounter of 12/13/15  Culture, group A strep  Result Value Ref Range   Specimen Description THROAT    Special Requests NONE    Culture NO GROUP A STREP (S.PYOGENES) ISOLATED    Report Status 12/16/2015 FINAL   POCT rapid strep A [facility])  Result Value Ref Range   Streptococcus, Group A Screen (Direct) NEGATIVE NEGATIVE    Labs Reviewed - No data to display  No results found.     ASSESSMENT & PLAN:  1. Acute maxillary sinusitis, recurrence not specified   2. Cough     Meds ordered this encounter  Medications  . amoxicillin (AMOXIL) 875 MG tablet    Sig: Take 1 tablet (875 mg total) by mouth 2 (two) times daily.    Dispense:  20 tablet    Refill:  0  . HYDROcodone-homatropine (HYDROMET) 5-1.5 MG/5ML syrup    Sig: Take 5 mLs by mouth every 6 (six) hours as needed for cough.    Dispense:  60  mL    Refill:  0    Reviewed expectations re: course of current medical issues. Questions answered. Outlined signs and symptoms indicating need for more acute intervention. Patient verbalized understanding. After Visit Summary given.    Procedures:      Robyn Haber, MD 02/25/17 1643

## 2017-02-25 NOTE — ED Triage Notes (Signed)
PT reports 1.5 weeks ago she started with a dry cough. PT then developed drainage. PT reports facial pressure. PT takes sudafed at home.

## 2017-05-19 DIAGNOSIS — G43101 Migraine with aura, not intractable, with status migrainosus: Secondary | ICD-10-CM | POA: Diagnosis not present

## 2017-06-11 DIAGNOSIS — J0100 Acute maxillary sinusitis, unspecified: Secondary | ICD-10-CM | POA: Diagnosis not present

## 2017-06-24 ENCOUNTER — Encounter (HOSPITAL_COMMUNITY): Payer: Self-pay | Admitting: Emergency Medicine

## 2017-06-24 ENCOUNTER — Other Ambulatory Visit: Payer: Self-pay

## 2017-06-24 ENCOUNTER — Ambulatory Visit (HOSPITAL_COMMUNITY)
Admission: EM | Admit: 2017-06-24 | Discharge: 2017-06-24 | Disposition: A | Discharge status: Home / Self Care | Payer: 59 | Attending: Family Medicine | Admitting: Family Medicine

## 2017-06-24 DIAGNOSIS — J0100 Acute maxillary sinusitis, unspecified: Secondary | ICD-10-CM

## 2017-06-24 HISTORY — DX: Personal history of other diseases of the nervous system and sense organs: Z86.69

## 2017-06-24 MED ORDER — AMOXICILLIN-POT CLAVULANATE 875-125 MG PO TABS: 1.0000 | ORAL_TABLET | Freq: Two times a day (BID) | ORAL | 0 refills | Status: DC

## 2017-06-24 NOTE — ED Triage Notes (Addendum)
The patient presented to the [facility] with a complaint of a headache x 2 days..   The patient reported a hx of migraine headaches. The patient also complained of sinus pain and pressure and laryngitis.

## 2017-06-27 ENCOUNTER — Ambulatory Visit (HOSPITAL_COMMUNITY)
Admission: EM | Admit: 2017-06-27 | Discharge: 2017-06-27 | Disposition: A | Discharge status: Home / Self Care | Payer: 59 | Attending: Family Medicine | Admitting: Family Medicine

## 2017-06-27 ENCOUNTER — Encounter (HOSPITAL_COMMUNITY): Payer: Self-pay | Admitting: Emergency Medicine

## 2017-06-27 DIAGNOSIS — R51 Headache: Secondary | ICD-10-CM

## 2017-06-27 DIAGNOSIS — J0140 Acute pansinusitis, unspecified: Secondary | ICD-10-CM | POA: Diagnosis not present

## 2017-06-27 DIAGNOSIS — R519 Headache, unspecified: Secondary | ICD-10-CM

## 2017-06-27 MED ORDER — DEXAMETHASONE SODIUM PHOSPHATE 10 MG/ML IJ SOLN
INTRAMUSCULAR | Status: AC
  Filled 2017-06-27: qty 1

## 2017-06-27 MED ORDER — DEXAMETHASONE SODIUM PHOSPHATE 10 MG/ML IJ SOLN
10.0000 mg | Freq: Once | INTRAMUSCULAR | Status: AC
  Administered 2017-06-27: 10 mg via INTRAMUSCULAR

## 2017-06-27 MED ORDER — FLUTICASONE PROPIONATE 50 MCG/ACT NA SUSP: 2.0000 | Freq: Every day | NASAL | 0 refills | Status: DC

## 2017-06-27 MED ORDER — METOCLOPRAMIDE HCL 5 MG/ML IJ SOLN
INTRAMUSCULAR | Status: AC
  Filled 2017-06-27: qty 2

## 2017-06-27 MED ORDER — IPRATROPIUM BROMIDE 0.06 % NA SOLN
2.0000 | Freq: Four times a day (QID) | NASAL | 0 refills | Status: DC
Start: 2017-06-27 — End: 2018-03-06

## 2017-06-27 MED ORDER — PREDNISONE 20 MG PO TABS: 40.0000 mg | ORAL_TABLET | Freq: Every day | ORAL | 0 refills | Status: AC

## 2017-06-27 MED ORDER — KETOROLAC TROMETHAMINE 60 MG/2ML IM SOLN
INTRAMUSCULAR | Status: AC
  Filled 2017-06-27: qty 2

## 2017-06-27 MED ORDER — KETOROLAC TROMETHAMINE 60 MG/2ML IM SOLN
60.0000 mg | Freq: Once | INTRAMUSCULAR | Status: AC
  Administered 2017-06-27: 60 mg via INTRAMUSCULAR

## 2017-06-27 MED ORDER — METOCLOPRAMIDE HCL 5 MG/ML IJ SOLN
5.0000 mg | Freq: Once | INTRAMUSCULAR | Status: AC
  Administered 2017-06-27: 5 mg via INTRAMUSCULAR

## 2017-06-27 NOTE — Discharge Instructions (Signed)
Toradol, Decadron, Reglan injection in office today.  Continue Augmentin as directed.  Start prednisone as directed. Start flonase, atrovent nasal spray for nasal congestion/drainage. You can use over the counter nasal saline rinse such as neti pot for nasal congestion. Keep hydrated, your urine should be clear to pale yellow in color. Tylenol/motrin for fever and pain. Monitor for any worsening of symptoms, chest pain, shortness of breath, wheezing, swelling of the throat, follow up for reevaluation. If experiencing worsening headache, blurry vision, weakness, dizziness, passing out, imbalance, confusion, go to the emergency department for further evaluation needed.

## 2017-06-27 NOTE — ED Provider Notes (Signed)
[place]    CSN: [id number] Arrival date & time: 06/27/17  1051     History   Chief Complaint Chief Complaint  Patient presents with  . Headache    HPI Connie Underwood is a 42 y.o. female.   42 year old female comes in for continued headache after being seen 3 days ago.  She was diagnosed with sinusitis at that time, and has started on Augmentin.  States that she also has a history of migraines, but current symptoms more consistent with sinus pressure.  States that pain is around frontal and maxillary area, worse with laying down.  Describes the pain as pressure sensation.  She denies nausea, vomiting, photophobia, phonophobia.  Denies fever, chills, night sweats.  Has been taking Augmentin as directed, has not acted on any other medications.     Past Medical History:  Diagnosis Date  . Hx of migraines   . Seasonal allergic reaction     There are no active problems to display for this patient.   History reviewed. No pertinent surgical history.  OB History   None      Home Medications    Prior to Admission medications   Medication Sig Start Date End Date Taking? Authorizing Provider  amoxicillin-clavulanate (AUGMENTIN) 875-125 MG tablet Take 1 tablet by mouth every 12 (twelve) hours. 06/24/17  Yes Hagler, Aaron Edelman, MD  escitalopram (LEXAPRO) 10 MG tablet Take 10 mg by mouth daily.   Yes [provider]  fluticasone (FLONASE) 50 MCG/ACT nasal spray Place 2 sprays into both nostrils daily. 06/27/17   Tasia Catchings, Amy V, PA-C  ipratropium (ATROVENT) 0.06 % nasal spray Place 2 sprays into both nostrils 4 (four) times daily. 06/27/17   Tasia Catchings, Amy V, PA-C  predniSONE (DELTASONE) 20 MG tablet Take 2 tablets (40 mg total) by mouth daily for 4 days. 06/27/17 07/01/17  Ok Edwards, PA-C    Family History No family history on file.  Social History Social History   Tobacco Use  . Smoking status: Never Smoker  . Smokeless tobacco: Never Used  Substance Use Topics  .  Alcohol use: No  . Drug use: No     Allergies   Tomato and Pollen extract   Review of Systems Review of Systems  Reason unable to perform ROS: See HPI as above.     Physical Exam Triage Vital Signs ED Triage Vitals  Enc Vitals Group     BP 06/27/17 1200 102/66     Pulse Rate 06/27/17 1200 79     Resp 06/27/17 1200 18     Temp 06/27/17 1200 98.6 F (37 C)     Temp src --      SpO2 06/27/17 1200 100 %     Weight --      Height --      Head Circumference --      Peak Flow --      Pain Score 06/27/17 1159 7     Pain Loc --      Pain Edu? --      Excl. in [place]? --    No data found.  Updated Vital Signs BP 102/66   Pulse 79   Temp 98.6 F (37 C)   Resp 18   SpO2 100%   Physical Exam  Constitutional: She is oriented to person, place, and time. She appears well-developed and well-nourished. No distress.  HENT:  Head: Normocephalic and atraumatic.  Right Ear: Tympanic membrane, external ear and ear canal normal. Tympanic  membrane is not erythematous and not bulging.  Left Ear: Tympanic membrane, external ear and ear canal normal. Tympanic membrane is not erythematous and not bulging.  Nose: Mucosal edema and rhinorrhea present. Right sinus exhibits maxillary sinus tenderness and frontal sinus tenderness. Left sinus exhibits maxillary sinus tenderness and frontal sinus tenderness.  Mouth/Throat: Uvula is midline, oropharynx is clear and moist and mucous membranes are normal.  Eyes: Pupils are equal, round, and reactive to light. Conjunctivae are normal.  Neck: Normal range of motion. Neck supple.  Cardiovascular: Normal rate, regular rhythm and normal heart sounds. Exam reveals no gallop and no friction rub.  No murmur heard. Pulmonary/Chest: Effort normal and breath sounds normal. She has no decreased breath sounds. She has no wheezes. She has no rhonchi. She has no rales.  Lymphadenopathy:    She has no cervical adenopathy.  Neurological: She is alert and oriented to  person, place, and time.  Skin: Skin is warm and dry.  Psychiatric: She has a normal mood and affect. Her behavior is normal. Judgment normal.     UC Treatments / Results  Labs (all labs ordered are listed, but only abnormal results are displayed) Labs Reviewed - No data to display  EKG None Radiology No results found.  Procedures Procedures (including critical care time)  Medications Ordered in UC Medications  ketorolac (TORADOL) injection 60 mg (60 mg Intramuscular Given 06/27/17 1306)  metoCLOPramide (REGLAN) injection 5 mg (5 mg Intramuscular Given 06/27/17 1306)  dexamethasone (DECADRON) injection 10 mg (10 mg Intramuscular Given 06/27/17 1306)     Initial Impression / Assessment and Plan / UC Course  I have reviewed the triage vital signs and the nursing notes.  Pertinent labs & imaging results that were available during my care of the patient were reviewed by me and considered in my medical decision making (see chart for details).    No alarming signs on exam.  Headache consistent with sinus pressure.  Toradol, Reglan, Decadron injection in office today to help with headache.  Patient to continue Augmentin.  Prednisone as directed for sinus pressure.  Other symptomatic treatment discussed.  Push fluids.  Return precautions given.  Patient expresses understanding and agrees to plan.  Final Clinical Impressions(s) / UC Diagnoses   Final diagnoses:  Acute non-recurrent pansinusitis  Sinus headache    ED Discharge Orders        Ordered    predniSONE (DELTASONE) 20 MG tablet  Daily     06/27/17 1250    fluticasone (FLONASE) 50 MCG/ACT nasal spray  Daily     06/27/17 1250    ipratropium (ATROVENT) 0.06 % nasal spray  4 times daily     06/27/17 1250         Ok Edwards, [place] 06/27/17 1335

## 2017-06-27 NOTE — ED Triage Notes (Signed)
Pt c/o migraine and sinus issues for "a while" was seen here three days ago for same.

## 2017-07-01 NOTE — ED Provider Notes (Signed)
[place]   [id number] 06/24/17 Arrival Time: 1846  ASSESSMENT & PLAN:  1. Acute non-recurrent maxillary sinusitis     Meds ordered this encounter  Medications  . amoxicillin-clavulanate (AUGMENTIN) 875-125 MG tablet    Sig: Take 1 tablet by mouth every 12 (twelve) hours.    Dispense:  20 tablet    Refill:  0    Will follow up with PCP or here if worsening or failing to improve as anticipated.  Reviewed expectations re: course of current medical issues. Questions answered. Outlined signs and symptoms indicating need for more acute intervention. Patient verbalized understanding. After Visit Summary given.   SUBJECTIVE:  Connie Underwood is a 42 y.o. female who presents with complaint of a facial pressure and mild frontal headache. Gradual onset over the past week. URI symptoms that are resolving. Nasal congestion. Afebrile. No respiratory symptoms. No n/v. H/O infrequent sinus infections and feels that she has one. No specific aggravating or alleviating factors reported. OTC decongestant without much relief.  ROS: As per HPI.   OBJECTIVE:  Vitals:   06/24/17 1957  BP: 104/71  Pulse: 83  Resp: 18  Temp: 98.7 F (37.1 C)  TempSrc: Oral  SpO2: 98%    General appearance: alert; no distress Eyes: conjunctiva normal HENT: normocephalic; atraumatic; nasal congestion; bilateral maxillary sinus tenderness to palpation; turbinates boggy Neck: supple with FROM Lungs: clear to auscultation bilaterally Heart: regular rate and rhythm Skin: warm and dry Psychological: alert and cooperative; normal mood and affect   Allergies  Allergen Reactions  . Tomato     Hives  . Pollen Extract Other (See Comments)    Seasonal allergies     Past Medical History:  Diagnosis Date  . Hx of migraines   . Seasonal allergic reaction    Social History   Socioeconomic History  . Marital status: Single    Spouse name: Not on file  . Number of children: Not on file  .  Years of education: Not on file  . Highest education level: Not on file  Occupational History  . Not on file  Social Needs  . Financial resource strain: Not on file  . Food insecurity:    Worry: Not on file    Inability: Not on file  . Transportation needs:    Medical: Not on file    Non-medical: Not on file  Tobacco Use  . Smoking status: Never Smoker  . Smokeless tobacco: Never Used  Substance and Sexual Activity  . Alcohol use: No  . Drug use: No  . Sexual activity: Not on file  Lifestyle  . Physical activity:    Days per week: Not on file    Minutes per session: Not on file  . Stress: Not on file  Relationships  . Social connections:    Talks on phone: Not on file    Gets together: Not on file    Attends religious service: Not on file    Active member of club or organization: Not on file    Attends meetings of clubs or organizations: Not on file    Relationship status: Not on file  . Intimate partner violence:    Fear of current or ex partner: Not on file    Emotionally abused: Not on file    Physically abused: Not on file    Forced sexual activity: Not on file  Other Topics Concern  . Not on file  Social History Narrative  . Not on file  History reviewed. No pertinent family history. History reviewed. No pertinent surgical history.   Vanessa Kick, MD 07/02/17 [phone]

## 2017-07-08 ENCOUNTER — Ambulatory Visit (HOSPITAL_COMMUNITY)
Admission: EM | Admit: 2017-07-08 | Discharge: 2017-07-08 | Disposition: A | Discharge status: Home / Self Care | Payer: 59 | Attending: Family Medicine | Admitting: Family Medicine

## 2017-07-08 ENCOUNTER — Encounter (HOSPITAL_COMMUNITY): Payer: Self-pay | Admitting: Emergency Medicine

## 2017-07-08 DIAGNOSIS — G43019 Migraine without aura, intractable, without status migrainosus: Secondary | ICD-10-CM

## 2017-07-08 MED ORDER — KETOROLAC TROMETHAMINE 60 MG/2ML IM SOLN
60.0000 mg | Freq: Once | INTRAMUSCULAR | Status: AC
  Administered 2017-07-08: 60 mg via INTRAMUSCULAR

## 2017-07-08 MED ORDER — DEXAMETHASONE SODIUM PHOSPHATE 10 MG/ML IJ SOLN
10.0000 mg | Freq: Once | INTRAMUSCULAR | Status: AC
  Administered 2017-07-08: 10 mg via INTRAMUSCULAR

## 2017-07-08 MED ORDER — METOCLOPRAMIDE HCL 5 MG/ML IJ SOLN
INTRAMUSCULAR | Status: AC
  Filled 2017-07-08: qty 2

## 2017-07-08 MED ORDER — MELOXICAM 7.5 MG PO TABS: 7.5000 mg | ORAL_TABLET | Freq: Every day | ORAL | 0 refills | Status: DC

## 2017-07-08 MED ORDER — DEXAMETHASONE SODIUM PHOSPHATE 10 MG/ML IJ SOLN
INTRAMUSCULAR | Status: AC
  Filled 2017-07-08: qty 1

## 2017-07-08 MED ORDER — METOCLOPRAMIDE HCL 5 MG/ML IJ SOLN
5.0000 mg | Freq: Once | INTRAMUSCULAR | Status: AC
  Administered 2017-07-08: 5 mg via INTRAMUSCULAR

## 2017-07-08 MED ORDER — ONDANSETRON 4 MG PO TBDP: 4.0000 mg | ORAL_TABLET | Freq: Three times a day (TID) | ORAL | 0 refills | Status: DC | PRN

## 2017-07-08 MED ORDER — KETOROLAC TROMETHAMINE 60 MG/2ML IM SOLN
INTRAMUSCULAR | Status: AC
  Filled 2017-07-08: qty 2

## 2017-07-08 MED ORDER — CYCLOBENZAPRINE HCL 5 MG PO TABS: 5.0000 mg | ORAL_TABLET | Freq: Every evening | ORAL | 0 refills | Status: DC | PRN

## 2017-07-08 NOTE — ED Triage Notes (Signed)
Pt c/o migraine onset yesterday. States she has taken her preventative medication with no relief. Sensitivity to light and sound.

## 2017-07-08 NOTE — ED Provider Notes (Signed)
[place]    CSN: [id number] Arrival date & time: 07/08/17  1741     History   Chief Complaint Chief Complaint  Patient presents with  . Migraine    HPI Connie Underwood is a 42 y.o. female.   42 year old female with history of migraines comes in for 2-day history of migraine.  States started at the back of the head, neck, shoulders.  Has had some photophobia and phonophobia.  Nausea and vomiting.  Has had 4 episodes today.  Denies abdominal pain, diarrhea.  Recently treated for sinusitis, slowly improving symptoms.  Denies fever, chills, night sweats.  Took 2 doses of her sumatriptan without relief.  Denies dizziness, weakness, syncope.     Past Medical History:  Diagnosis Date  . Hx of migraines   . Seasonal allergic reaction     There are no active problems to display for this patient.   History reviewed. No pertinent surgical history.  OB History   None      Home Medications    Prior to Admission medications   Medication Sig Start Date End Date Taking? Authorizing Provider  escitalopram (LEXAPRO) 10 MG tablet Take 10 mg by mouth daily.   Yes [provider]  fluticasone (FLONASE) 50 MCG/ACT nasal spray Place 2 sprays into both nostrils daily. 06/27/17  Yes Beanca Kiester V, PA-C  ipratropium (ATROVENT) 0.06 % nasal spray Place 2 sprays into both nostrils 4 (four) times daily. 06/27/17  Yes Fredrica Capano V, PA-C  SUMAtriptan (IMITREX) 50 MG tablet  05/19/17  Yes [provider]  topiramate (TOPAMAX) 25 MG tablet  05/19/17  Yes [provider]  cyclobenzaprine (FLEXERIL) 5 MG tablet Take 1 tablet (5 mg total) by mouth at bedtime as needed for muscle spasms. 07/08/17   Tasia Catchings, Javel Hersh V, PA-C  meloxicam (MOBIC) 7.5 MG tablet Take 1 tablet (7.5 mg total) by mouth daily. 07/08/17   Tasia Catchings, Runa Whittingham V, PA-C  ondansetron (ZOFRAN ODT) 4 MG disintegrating tablet Take 1 tablet (4 mg total) by mouth every 8 (eight) hours as needed for nausea or vomiting. 07/08/17   Ok Edwards, PA-C    Family History No family history on file.  Social History Social History   Tobacco Use  . Smoking status: Never Smoker  . Smokeless tobacco: Never Used  Substance Use Topics  . Alcohol use: No  . Drug use: No     Allergies   Tomato and Pollen extract   Review of Systems Review of Systems  Reason unable to perform ROS: See HPI as above.     Physical Exam Triage Vital Signs ED Triage Vitals  Enc Vitals Group     BP 07/08/17 1757 111/72     Pulse Rate 07/08/17 1757 79     Resp 07/08/17 1757 14     Temp 07/08/17 1757 98.1 F (36.7 C)     Temp Source 07/08/17 1757 Oral     SpO2 07/08/17 1757 98 %     Weight --      Height --      Head Circumference --      Peak Flow --      Pain Score 07/08/17 1759 9     Pain Loc --      Pain Edu? --      Excl. in [place]? --    No data found.  Updated Vital Signs BP 111/72 (BP Location: Left Arm)   Pulse 79   Temp 98.1 F (36.7  C) (Oral)   Resp 14   LMP 06/07/2017 (Approximate)   SpO2 98%   Physical Exam  Constitutional: She is oriented to person, place, and time. She appears well-developed and well-nourished. No distress.  HENT:  Head: Normocephalic and atraumatic.  Eyes: Pupils are equal, round, and reactive to light. Conjunctivae and EOM are normal.  Neck: Normal range of motion. Neck supple. Muscular tenderness (bilateral) present. No spinous process tenderness present. Normal range of motion present.  Cardiovascular: Normal rate, regular rhythm and normal heart sounds. Exam reveals no gallop and no friction rub.  No murmur heard. Pulmonary/Chest: Effort normal and breath sounds normal. No accessory muscle usage or stridor. No respiratory distress. She has no decreased breath sounds. She has no wheezes. She has no rhonchi. She has no rales.  Musculoskeletal:  No tenderness on palpation of the spinous processes.  Tenderness to palpation of bilateral trapezius muscle.  Full range of motion shoulder. Strength  normal and equal bilaterally. Sensation intact and equal bilaterally.  Radial pulses 2+ and equal bilaterally. Capillary refill less than 2 seconds.   Neurological: She is alert and oriented to person, place, and time. She is not disoriented. Coordination and gait normal. GCS eye subscore is 4. GCS verbal subscore is 5. GCS motor subscore is 6.  Skin: Skin is warm and dry.     UC Treatments / Results  Labs (all labs ordered are listed, but only abnormal results are displayed) Labs Reviewed - No data to display  EKG None Radiology No results found.  Procedures Procedures (including critical care time)  Medications Ordered in UC Medications  ketorolac (TORADOL) injection 60 mg (60 mg Intramuscular Given 07/08/17 1844)  metoCLOPramide (REGLAN) injection 5 mg (5 mg Intramuscular Given 07/08/17 1847)  dexamethasone (DECADRON) injection 10 mg (10 mg Intramuscular Given 07/08/17 1846)     Initial Impression / Assessment and Plan / UC Course  I have reviewed the triage vital signs and the nursing notes.  Pertinent labs & imaging results that were available during my care of the patient were reviewed by me and considered in my medical decision making (see chart for details).    Toradol, Reglan, Decadron injection in office today for migraine.  Given tenderness to palpation of neck and trapezius muscle, discussed with patient that headache could also be from muscle strain.  Can  start Mobic, Flexeril as needed if continues to have symptoms tomorrow.  Zofran as needed for nausea/vomiting.  Return precautions given.  Patient to follow-up with PCP for further evaluation and management needed.  Patient expresses understanding and agrees to plan.  Final Clinical Impressions(s) / UC Diagnoses   Final diagnoses:  Intractable migraine without aura and without status migrainosus    ED Discharge Orders        Ordered    meloxicam (MOBIC) 7.5 MG tablet  Daily     07/08/17 1831    cyclobenzaprine  (FLEXERIL) 5 MG tablet  At bedtime PRN     07/08/17 1831    ondansetron (ZOFRAN ODT) 4 MG disintegrating tablet  Every 8 hours PRN     07/08/17 1833        Ok Edwards, PA-C 07/08/17 1854

## 2017-07-08 NOTE — ED Triage Notes (Signed)
Patient states she has also been nauseous and emesis x 1 this afternoon around once. Has vomited about 4 times since symptoms began.

## 2017-07-08 NOTE — Discharge Instructions (Addendum)
Toradol, Reglan, Decadron injection in office today. Reglan will help with nausea/vomiting. I have also called in some zofran as needed if continue to have nausea/vomiting. As discussed, migraine could  have also come from muscle strain given neck and shoulder tenderness. You can start mobic tomorrow. Flexeril as needed at night. Flexeril can make you drowsy, so do not take if you are going to drive, operate heavy machinery, or make important decisions. Ice/heat compresses as needed.  Follow-up with PCP for further evaluation and management needed for migraine.  If experiencing worsening symptoms, blurry vision, nausea/vomiting not controlled by medication, weakness, dizziness, imbalance, passing out, go to the emergency department for further evaluation.

## 2017-07-14 DIAGNOSIS — G43101 Migraine with aura, not intractable, with status migrainosus: Secondary | ICD-10-CM | POA: Diagnosis not present

## 2017-07-15 ENCOUNTER — Ambulatory Visit (HOSPITAL_COMMUNITY)
Admission: EM | Admit: 2017-07-15 | Discharge: 2017-07-15 | Disposition: A | Discharge status: Home / Self Care | Payer: 59 | Attending: Emergency Medicine | Admitting: Emergency Medicine

## 2017-07-15 ENCOUNTER — Encounter (HOSPITAL_COMMUNITY): Payer: Self-pay | Admitting: Emergency Medicine

## 2017-07-15 DIAGNOSIS — G43009 Migraine without aura, not intractable, without status migrainosus: Secondary | ICD-10-CM | POA: Diagnosis not present

## 2017-07-15 MED ORDER — KETOROLAC TROMETHAMINE 60 MG/2ML IM SOLN
INTRAMUSCULAR | Status: AC
  Filled 2017-07-15: qty 2

## 2017-07-15 MED ORDER — NAPROXEN 375 MG PO TABS: 375.0000 mg | ORAL_TABLET | Freq: Two times a day (BID) | ORAL | 0 refills | Status: DC

## 2017-07-15 MED ORDER — METOCLOPRAMIDE HCL 5 MG/ML IJ SOLN
5.0000 mg | Freq: Once | INTRAMUSCULAR | Status: AC
  Administered 2017-07-15: 5 mg via INTRAMUSCULAR

## 2017-07-15 MED ORDER — METOCLOPRAMIDE HCL 5 MG/ML IJ SOLN
INTRAMUSCULAR | Status: AC
  Filled 2017-07-15: qty 2

## 2017-07-15 MED ORDER — DEXAMETHASONE SODIUM PHOSPHATE 10 MG/ML IJ SOLN
10.0000 mg | Freq: Once | INTRAMUSCULAR | Status: AC
  Administered 2017-07-15: 10 mg via INTRAMUSCULAR

## 2017-07-15 MED ORDER — KETOROLAC TROMETHAMINE 60 MG/2ML IM SOLN
60.0000 mg | Freq: Once | INTRAMUSCULAR | Status: AC
  Administered 2017-07-15: 60 mg via INTRAMUSCULAR

## 2017-07-15 MED ORDER — DEXAMETHASONE SODIUM PHOSPHATE 10 MG/ML IJ SOLN
INTRAMUSCULAR | Status: AC
  Filled 2017-07-15: qty 1

## 2017-07-15 MED ORDER — ONDANSETRON 4 MG PO TBDP: 4.0000 mg | ORAL_TABLET | Freq: Three times a day (TID) | ORAL | 0 refills | Status: DC | PRN

## 2017-07-15 NOTE — Discharge Instructions (Signed)
Today we gave you a shot of Toradol, Reglan and Decadron.  These medicine should begin working in approximately 30-40 minutes.  Please use Zofran and Naprosyn at home for further headaches and nausea.  Please follow-up with the area primary care tomorrow as planned.

## 2017-07-15 NOTE — ED Triage Notes (Signed)
Pt sts migraine HA today with nausea; pt sts hx of same

## 2017-07-15 NOTE — ED Provider Notes (Signed)
Connie Underwood    CSN: [id number] Arrival date & time: 07/15/17  1703     History   Chief Complaint Chief Complaint  Patient presents with  . Headache    HPI Connie Underwood is a 42 y.o. female presenting today for evaluation of a migraine.  States that her migraine began last night and has persisted into today.  It is associated with nausea, photophobia, phonophobia as well as some dizziness.  She denies any vision changes.  Denies any weakness or difficulty speaking.  Does not have history of high blood pressure.  Patient has tried taking 2 Imitrex today without relief.  Has not tried any other medicines.  Has plans to follow-up with her PCP tomorrow to discuss preventative medications; as her migraines have been becoming more frequent.  HPI  Past Medical History:  Diagnosis Date  . Hx of migraines   . Seasonal allergic reaction     There are no active problems to display for this patient.   History reviewed. No pertinent surgical history.  OB History   None      Home Medications    Prior to Admission medications   Medication Sig Start Date End Date Taking? Authorizing Provider  cyclobenzaprine (FLEXERIL) 5 MG tablet Take 1 tablet (5 mg total) by mouth at bedtime as needed for muscle spasms. 07/08/17   Tasia Catchings, Amy V, PA-C  escitalopram (LEXAPRO) 10 MG tablet Take 10 mg by mouth daily.    [provider]  fluticasone (FLONASE) 50 MCG/ACT nasal spray Place 2 sprays into both nostrils daily. 06/27/17   Tasia Catchings, Amy V, PA-C  ipratropium (ATROVENT) 0.06 % nasal spray Place 2 sprays into both nostrils 4 (four) times daily. 06/27/17   Tasia Catchings, Amy V, PA-C  meloxicam (MOBIC) 7.5 MG tablet Take 1 tablet (7.5 mg total) by mouth daily. 07/08/17   Tasia Catchings, Amy V, PA-C  naproxen (NAPROSYN) 375 MG tablet Take 1 tablet (375 mg total) by mouth 2 (two) times daily. 07/15/17   Wesam Gearhart C, PA-C  ondansetron (ZOFRAN ODT) 4 MG disintegrating tablet Take 1 tablet (4 mg total) by mouth  every 8 (eight) hours as needed for nausea or vomiting. 07/15/17   Cailen Mihalik C, PA-C  SUMAtriptan (IMITREX) 50 MG tablet  05/19/17   [provider]  topiramate (TOPAMAX) 25 MG tablet  05/19/17   [provider]    Family History History reviewed. No pertinent family history.  Social History Social History   Tobacco Use  . Smoking status: Never Smoker  . Smokeless tobacco: Never Used  Substance Use Topics  . Alcohol use: No  . Drug use: No     Allergies   Tomato and Pollen extract   Review of Systems Review of Systems  Constitutional: Negative for fatigue and fever.  Eyes: Positive for photophobia. Negative for pain and visual disturbance.  Respiratory: Negative for shortness of breath.   Cardiovascular: Negative for chest pain.  Gastrointestinal: Positive for nausea. Negative for abdominal pain and vomiting.  Musculoskeletal: Negative for myalgias.  Neurological: Positive for dizziness and headaches. Negative for syncope, speech difficulty, weakness, light-headedness and numbness.     Physical Exam Triage Vital Signs ED Triage Vitals [07/15/17 1724]  Enc Vitals Group     BP 111/82     Pulse Rate 87     Resp 18     Temp 99.8 F (37.7 C)     Temp Source Oral     SpO2 97 %  Weight      Height      Head Circumference      Peak Flow      Pain Score      Pain Loc      Pain Edu?      Excl. in [place]?    No data found.  Updated Vital Signs BP 111/82 (BP Location: Right Arm)   Pulse 87   Temp 99.8 F (37.7 C) (Oral)   Resp 18   SpO2 97%   Visual Acuity Right Eye Distance:   Left Eye Distance:   Bilateral Distance:    Right Eye Near:   Left Eye Near:    Bilateral Near:     Physical Exam  Constitutional: She is oriented to person, place, and time. She appears well-developed and well-nourished. No distress.  HENT:  Head: Normocephalic and atraumatic.  Eyes: Pupils are equal, round, and reactive to light. Conjunctivae and EOM are  normal.  Neck: Neck supple.  Cardiovascular: Normal rate and regular rhythm.  No murmur heard. Pulmonary/Chest: Effort normal and breath sounds normal. No respiratory distress.  Breathing comfortably at rest, CTA BL  Abdominal: Soft. There is no tenderness.  Musculoskeletal: She exhibits no edema.  Neurological: She is alert and oriented to person, place, and time. She has normal strength. GCS eye subscore is 4. GCS verbal subscore is 5. GCS motor subscore is 6.  Cranial nerves II through XII grossly intact, normal coordination, speech normal, no gait abnormality.  Strength 5/5 and equal bilaterally at shoulders and hips.  Skin: Skin is warm and dry.  Psychiatric: She has a normal mood and affect.  Nursing note and vitals reviewed.    UC Treatments / Results  Labs (all labs ordered are listed, but only abnormal results are displayed) Labs Reviewed - No data to display  EKG None Radiology No results found.  Procedures Procedures (including critical care time)  Medications Ordered in UC Medications  ketorolac (TORADOL) injection 60 mg (has no administration in time range)  metoCLOPramide (REGLAN) injection 5 mg (has no administration in time range)  dexamethasone (DECADRON) injection 10 mg (has no administration in time range)     Initial Impression / Assessment and Plan / UC Course  I have reviewed the triage vital signs and the nursing notes.  Pertinent labs & imaging results that were available during my care of the patient were reviewed by me and considered in my medical decision making (see chart for details).     Patient with migraine, no focal neuro deficits, blood pressure stable, no red flags.  Headache medications of Toradol, Reglan and Decadron provided in clinic today as she has had success with these in the past.  Follow-up with PCP and possibly neurology given frequent migraines and to further discuss preventative medications.  Provided Zofran and Naprosyn to  use at home for further relief of nausea and headache.  Final Clinical Impressions(s) / UC Diagnoses   Final diagnoses:  Migraine without aura and without status migrainosus, not intractable    ED Discharge Orders        Ordered    ondansetron (ZOFRAN ODT) 4 MG disintegrating tablet  Every 8 hours PRN     07/15/17 1737    naproxen (NAPROSYN) 375 MG tablet  2 times daily     07/15/17 1737       Controlled Substance Prescriptions [place] Controlled Substance Registry consulted? Not Applicable   Janith Lima, [place] 07/15/17 1741

## 2017-07-20 DIAGNOSIS — Z719 Counseling, unspecified: Secondary | ICD-10-CM | POA: Diagnosis not present

## 2017-07-30 DIAGNOSIS — Z719 Counseling, unspecified: Secondary | ICD-10-CM | POA: Diagnosis not present

## 2017-08-06 DIAGNOSIS — Z719 Counseling, unspecified: Secondary | ICD-10-CM | POA: Diagnosis not present

## 2017-08-13 DIAGNOSIS — Z719 Counseling, unspecified: Secondary | ICD-10-CM | POA: Diagnosis not present

## 2017-08-20 DIAGNOSIS — Z719 Counseling, unspecified: Secondary | ICD-10-CM | POA: Diagnosis not present

## 2017-09-08 DIAGNOSIS — N390 Urinary tract infection, site not specified: Secondary | ICD-10-CM | POA: Diagnosis not present

## 2017-09-08 DIAGNOSIS — J019 Acute sinusitis, unspecified: Secondary | ICD-10-CM | POA: Diagnosis not present

## 2017-09-27 DIAGNOSIS — N760 Acute vaginitis: Secondary | ICD-10-CM | POA: Diagnosis not present

## 2017-09-28 DIAGNOSIS — N760 Acute vaginitis: Secondary | ICD-10-CM | POA: Diagnosis not present

## 2017-10-16 DIAGNOSIS — S161XXA Strain of muscle, fascia and tendon at neck level, initial encounter: Secondary | ICD-10-CM | POA: Diagnosis not present

## 2017-12-19 DIAGNOSIS — J019 Acute sinusitis, unspecified: Secondary | ICD-10-CM | POA: Diagnosis not present

## 2018-01-27 DIAGNOSIS — N760 Acute vaginitis: Secondary | ICD-10-CM | POA: Diagnosis not present

## 2018-01-27 DIAGNOSIS — B373 Candidiasis of vulva and vagina: Secondary | ICD-10-CM | POA: Diagnosis not present

## 2018-03-04 DIAGNOSIS — J019 Acute sinusitis, unspecified: Secondary | ICD-10-CM | POA: Diagnosis not present

## 2018-03-04 DIAGNOSIS — J040 Acute laryngitis: Secondary | ICD-10-CM | POA: Diagnosis not present

## 2018-03-06 ENCOUNTER — Encounter (HOSPITAL_COMMUNITY): Payer: Self-pay | Admitting: Family Medicine

## 2018-03-06 ENCOUNTER — Ambulatory Visit (HOSPITAL_COMMUNITY)
Admission: EM | Admit: 2018-03-06 | Discharge: 2018-03-06 | Disposition: A | Discharge status: Home / Self Care | Payer: 59 | Attending: Family Medicine | Admitting: Family Medicine

## 2018-03-06 DIAGNOSIS — J320 Chronic maxillary sinusitis: Secondary | ICD-10-CM

## 2018-03-06 MED ORDER — AMOXICILLIN-POT CLAVULANATE 875-125 MG PO TABS: 1.0000 | ORAL_TABLET | Freq: Two times a day (BID) | ORAL | 0 refills | Status: DC

## 2018-03-06 MED ORDER — HYDROCODONE-HOMATROPINE 5-1.5 MG/5ML PO SYRP: 5.0000 mL | ORAL_SOLUTION | Freq: Four times a day (QID) | ORAL | 0 refills | Status: DC | PRN

## 2018-03-06 MED ORDER — BUDESONIDE 32 MCG/ACT NA SUSP: 2.0000 | Freq: Every day | NASAL | 4 refills | Status: DC

## 2018-03-06 NOTE — ED Provider Notes (Signed)
[place]    CSN: [id number] Arrival date & time: 03/06/18  1317     History   Chief Complaint Chief Complaint  Patient presents with  . URI    HPI Connie Underwood is a 42 y.o. female.   This a 42 year old woman who is an established patient at [facility] urgent care on [facility] and comes in with upper respiratory type infection.  She has had a headache and congestion for 2 weeks.  She has had no fever but she has had tenderness in her cheeks.  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  Patient works for [employer]     Past Medical History:  Diagnosis Date  . Hx of migraines   . Seasonal allergic reaction     There are no active problems to display for this patient.   History reviewed. No pertinent surgical history.  OB History   None      Home Medications    Prior to Admission medications   Medication Sig Start Date End Date Taking? Authorizing Provider  amoxicillin-clavulanate (AUGMENTIN) 875-125 MG tablet Take 1 tablet by mouth every 12 (twelve) hours. 03/06/18   Robyn Haber, MD  budesonide (RHINOCORT ALLERGY) 32 MCG/ACT nasal spray Place 2 sprays into both nostrils daily. 03/06/18   Robyn Haber, MD  escitalopram (LEXAPRO) 10 MG tablet Take 10 mg by mouth daily.    [provider]  HYDROcodone-homatropine (HYDROMET) 5-1.5 MG/5ML syrup Take 5 mLs by mouth every 6 (six) hours as needed for cough. 03/06/18   Robyn Haber, MD  SUMAtriptan (IMITREX) 50 MG tablet  05/19/17   [provider]  topiramate (TOPAMAX) 25 MG tablet  05/19/17   [provider]    Family History History reviewed. No pertinent family history.  Social History Social History   Tobacco Use  . Smoking status: Never Smoker  . Smokeless tobacco: Never Used  Substance Use Topics  . Alcohol use: No  . Drug use: No     Allergies   Tomato and Pollen extract   Review of Systems Review  of Systems  Constitutional: Negative.  Negative for fever.  HENT: Positive for congestion.   Respiratory: Positive for cough.   All other systems reviewed and are negative.    Physical Exam Triage Vital Signs ED Triage Vitals  Enc Vitals Group     BP 03/06/18 1403 (!) 141/86     Pulse Rate 03/06/18 1403 82     Resp 03/06/18 1403 18     Temp 03/06/18 1403 98.4 F (36.9 C)     Temp Source 03/06/18 1403 Oral     SpO2 03/06/18 1403 100 %     Weight --      Height --      Head Circumference --      Peak Flow --      Pain Score 03/06/18 1404 8     Pain Loc --      Pain Edu? --      Excl. in [place]? --    No data found.  Updated Vital Signs BP (!) 141/86 (BP Location: Right Arm)   Pulse 82   Temp 98.4 F (36.9 C) (Oral)   Resp 18   LMP 02/20/2018   SpO2 100%    Physical Exam  Constitutional: She is oriented to person, place, and time. She appears well-developed and well-nourished.  HENT:  Right Ear: External ear normal.  Left  Ear: External ear normal.  Mouth/Throat: Oropharynx is clear and moist.  Tender maxillae  Eyes: Conjunctivae are normal.  Neck: Normal range of motion. Neck supple.  Cardiovascular: Normal rate, regular rhythm and normal heart sounds.  Pulmonary/Chest: Effort normal and breath sounds normal.  Musculoskeletal: Normal range of motion.  Neurological: She is alert and oriented to person, place, and time.  Skin: Skin is warm and dry.  Nursing note and vitals reviewed.    UC Treatments / Results  Labs (all labs ordered are listed, but only abnormal results are displayed) Labs Reviewed - No data to display  EKG None  Radiology No results found.  Procedures Procedures (including critical care time)  Medications Ordered in UC Medications - No data to display  Initial Impression / Assessment and Plan / UC Course  I have reviewed the triage vital signs and the nursing notes.  Pertinent labs & imaging results that were available during my  care of the patient were reviewed by me and considered in my medical decision making (see chart for details).    Final Clinical Impressions(s) / UC Diagnoses   Final diagnoses:  Chronic maxillary sinusitis   Discharge Instructions   None    ED Prescriptions    Medication Sig Dispense Auth. Provider   budesonide (RHINOCORT ALLERGY) 32 MCG/ACT nasal spray Place 2 sprays into both nostrils daily. 8.43 mL Robyn Haber, MD   amoxicillin-clavulanate (AUGMENTIN) 875-125 MG tablet Take 1 tablet by mouth every 12 (twelve) hours. 14 tablet Robyn Haber, MD   HYDROcodone-homatropine (HYDROMET) 5-1.5 MG/5ML syrup Take 5 mLs by mouth every 6 (six) hours as needed for cough. 60 mL Robyn Haber, MD     Controlled Substance Prescriptions [place] Controlled Substance Registry consulted? Not Applicable   Robyn Haber, MD 03/06/18 1413

## 2018-03-06 NOTE — ED Triage Notes (Signed)
Pt here with HA and URI sx x 2 weeks

## 2018-04-21 DIAGNOSIS — J019 Acute sinusitis, unspecified: Secondary | ICD-10-CM | POA: Diagnosis not present

## 2018-04-23 DIAGNOSIS — R110 Nausea: Secondary | ICD-10-CM | POA: Diagnosis not present

## 2018-04-23 DIAGNOSIS — G43101 Migraine with aura, not intractable, with status migrainosus: Secondary | ICD-10-CM | POA: Diagnosis not present

## 2018-04-28 ENCOUNTER — Ambulatory Visit: Payer: 59 | Admitting: Family Medicine

## 2018-06-02 DIAGNOSIS — N760 Acute vaginitis: Secondary | ICD-10-CM | POA: Diagnosis not present

## 2018-06-02 DIAGNOSIS — B373 Candidiasis of vulva and vagina: Secondary | ICD-10-CM | POA: Diagnosis not present

## 2018-06-16 DIAGNOSIS — J019 Acute sinusitis, unspecified: Secondary | ICD-10-CM | POA: Diagnosis not present

## 2018-07-20 DIAGNOSIS — G43101 Migraine with aura, not intractable, with status migrainosus: Secondary | ICD-10-CM | POA: Diagnosis not present

## 2019-01-07 ENCOUNTER — Other Ambulatory Visit (HOSPITAL_COMMUNITY)
Admission: RE | Admit: 2019-01-07 | Discharge: 2019-01-07 | Disposition: A | Discharge status: Home / Self Care | Payer: 59 | Source: Ambulatory Visit | Attending: Family Medicine | Admitting: Family Medicine

## 2019-01-07 ENCOUNTER — Other Ambulatory Visit: Payer: Self-pay | Admitting: Family Medicine

## 2019-01-07 DIAGNOSIS — Z124 Encounter for screening for malignant neoplasm of cervix: Secondary | ICD-10-CM | POA: Insufficient documentation

## 2019-01-18 LAB — CYTOLOGY - PAP
Adequacy: ABSENT
Chlamydia: NEGATIVE
Comment: NEGATIVE
Comment: NEGATIVE
Comment: NEGATIVE
Comment: NORMAL
Diagnosis: NEGATIVE
High risk HPV: NEGATIVE
Neisseria Gonorrhea: NEGATIVE
Trichomonas: NEGATIVE

## 2019-07-09 ENCOUNTER — Other Ambulatory Visit (HOSPITAL_BASED_OUTPATIENT_CLINIC_OR_DEPARTMENT_OTHER): Payer: Self-pay | Admitting: Family Medicine

## 2019-07-09 DIAGNOSIS — G43011 Migraine without aura, intractable, with status migrainosus: Secondary | ICD-10-CM

## 2019-07-13 ENCOUNTER — Other Ambulatory Visit: Payer: Self-pay

## 2019-07-13 ENCOUNTER — Ambulatory Visit (HOSPITAL_BASED_OUTPATIENT_CLINIC_OR_DEPARTMENT_OTHER)
Admission: RE | Admit: 2019-07-13 | Discharge: 2019-07-13 | Disposition: A | Discharge status: Home / Self Care | Payer: 59 | Source: Ambulatory Visit | Attending: Family Medicine | Admitting: Family Medicine

## 2019-07-13 DIAGNOSIS — G43011 Migraine without aura, intractable, with status migrainosus: Secondary | ICD-10-CM | POA: Diagnosis present

## 2019-07-13 IMAGING — CT CT HEAD WO/W CM
4 of 8 series · 16 of 47 positions shown, 18 images · IV contrast (omnipaque)
Comparison: CT maxillofacial [DATE]

CLINICAL DATA: Intractable migraine without LAMYA and without status
migrainosus. Additional history provided: Migraine for 3 years.

EXAM:
CT HEAD WITHOUT AND WITH CONTRAST
TECHNIQUE: Contiguous axial images were obtained from the base of the skull
through the vertex without and with intravenous contrast
CONTRAST:  100mL OMNIPAQUE IOHEXOL 300 MG/ML  SOLN

[Series 2: head wo · axial · 0.45mm/px · z∈[-158,-58]mm · 5 of 30 slices shown]
[im 5/30  brain]
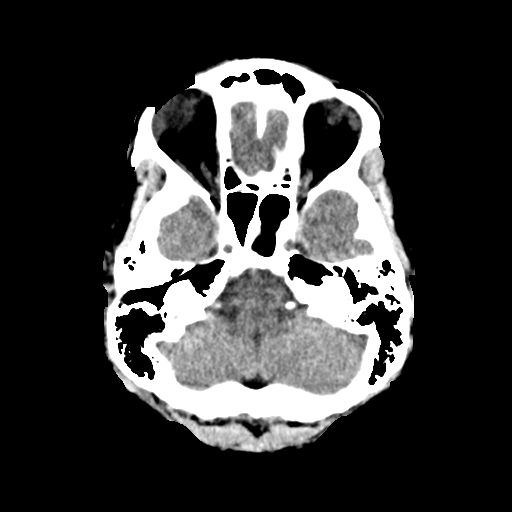
[im 10/30  brain]
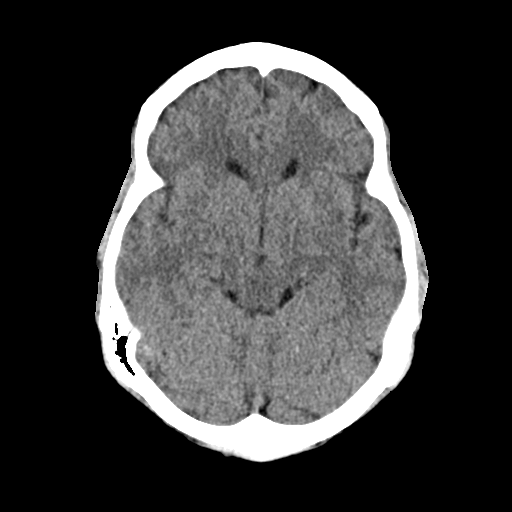
[im 15/30  brain]
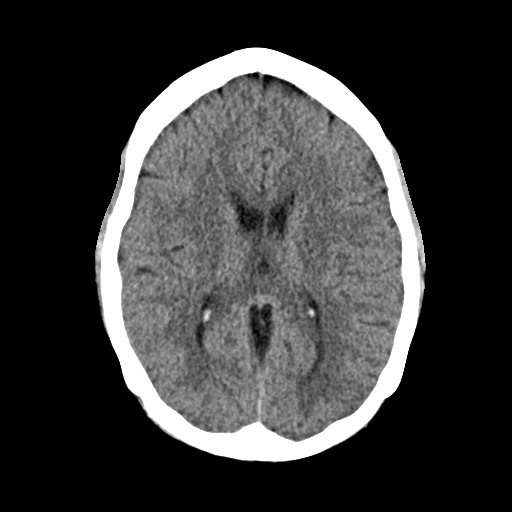
[im 20/30  brain]
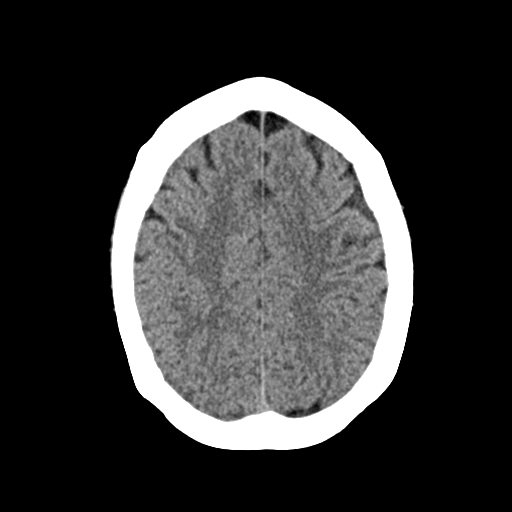
[im 25/30  brain]
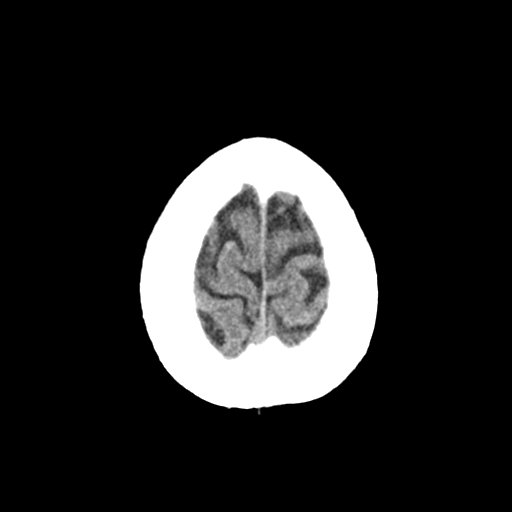

[Series 6: head w soft · axial · 0.45mm/px · z∈[-158,-58]mm · 6 of 30 slices shown, 8 images]
[im 5/30  brain]
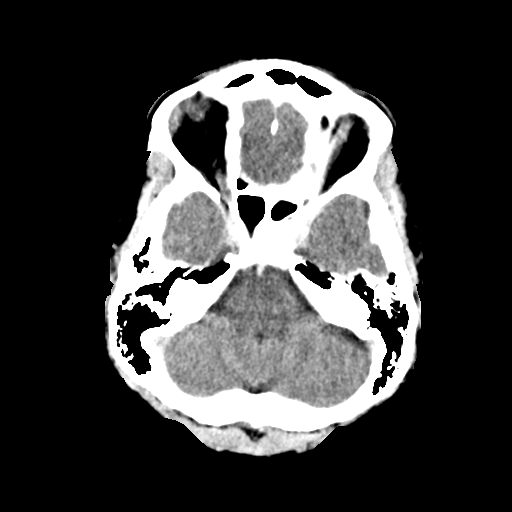
[im 5/30  bone]
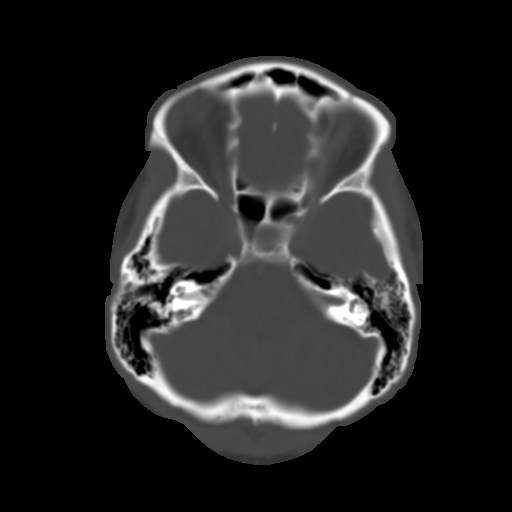
[im 9/30  brain]
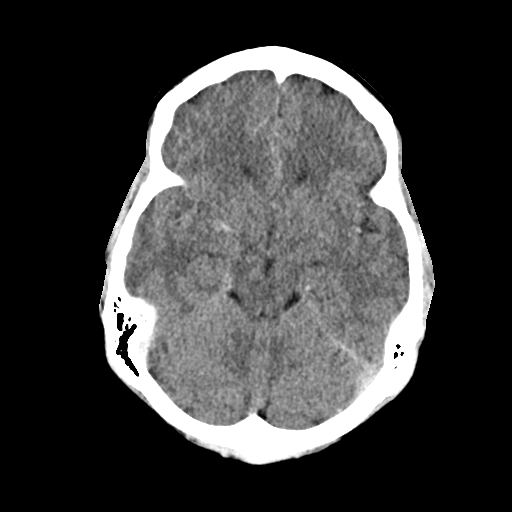
[im 13/30  brain]
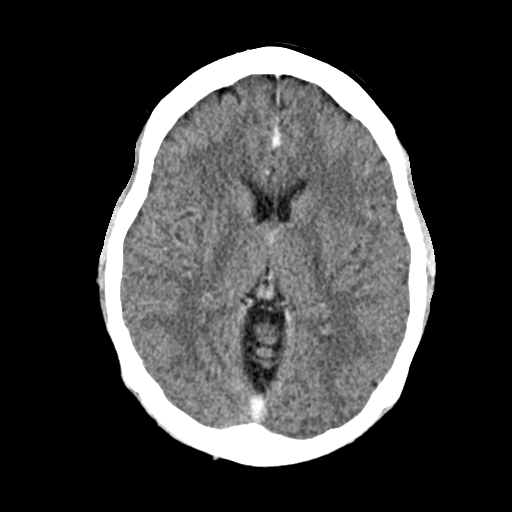
[im 17/30  brain]
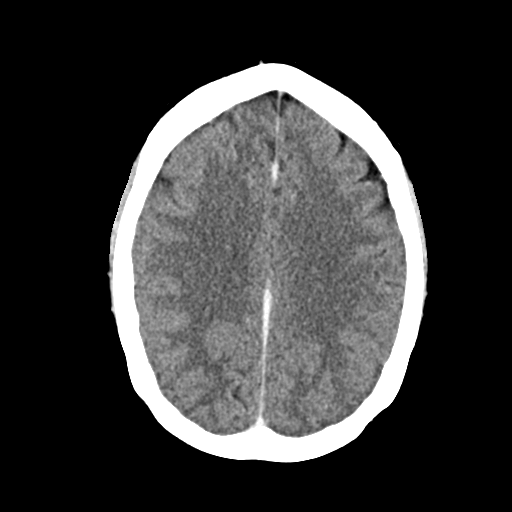
[im 21/30  brain]
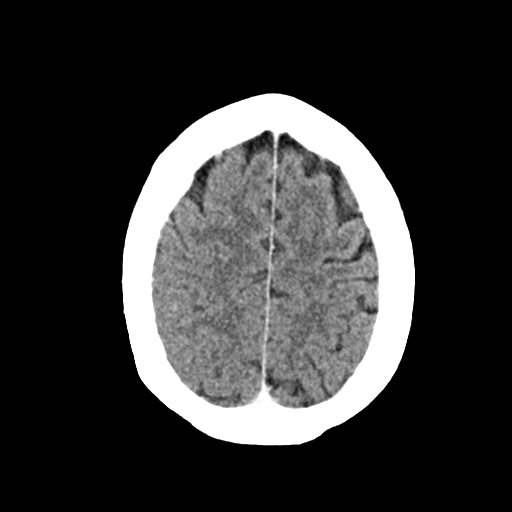
[im 21/30  bone]
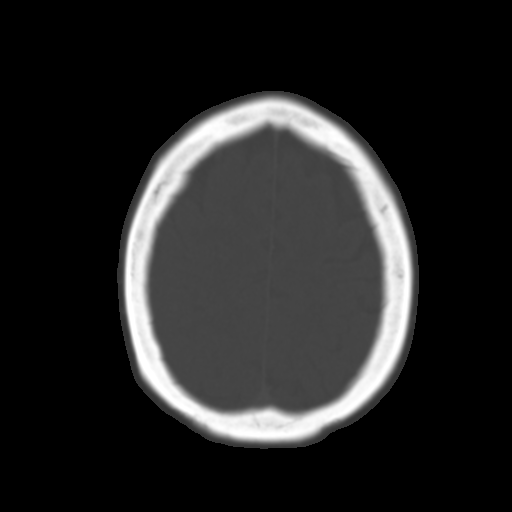
[im 25/30  brain]
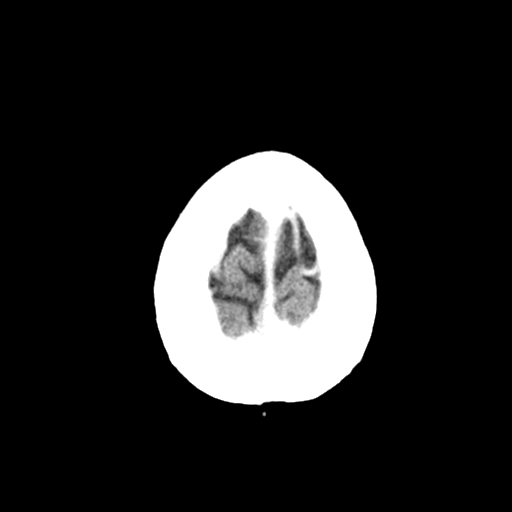

[Series 8: cor soft · coronal · 0.29mm/px · 3 of 72 slices shown]
[im 18/72  brain]
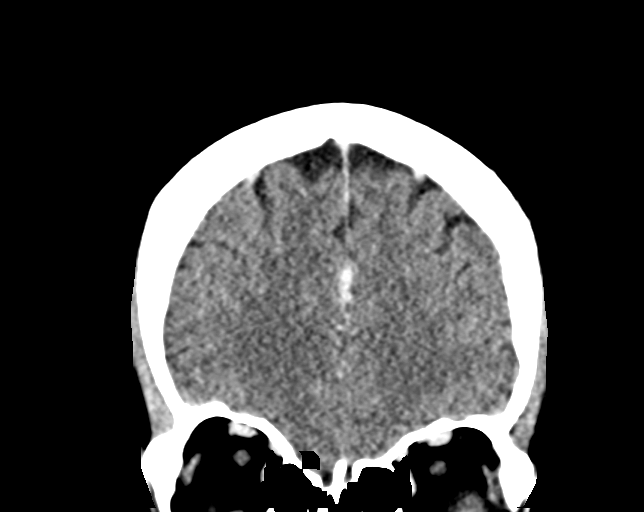
[im 36/72  brain]
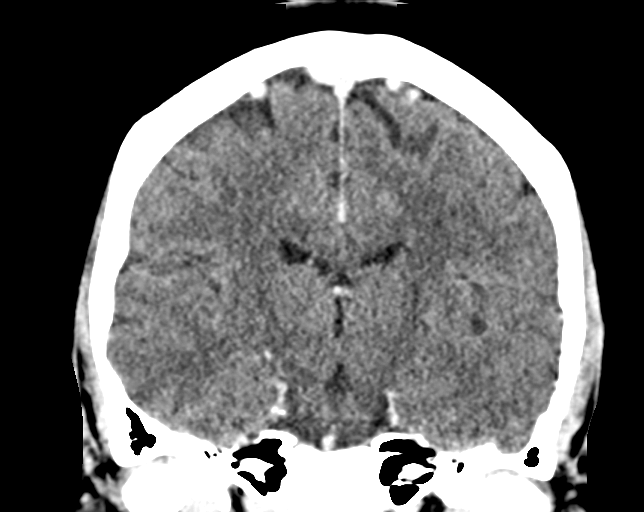
[im 54/72  brain]
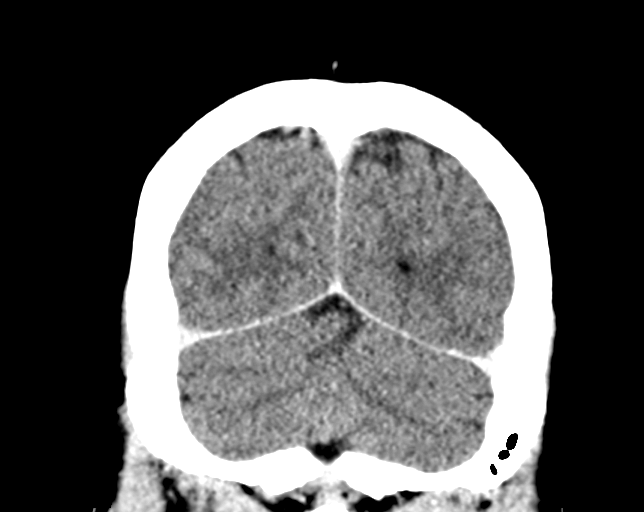

[Series 9: sag soft · sagittal · 0.29mm/px · 2 of 67 slices shown]
[im 23/67  brain]
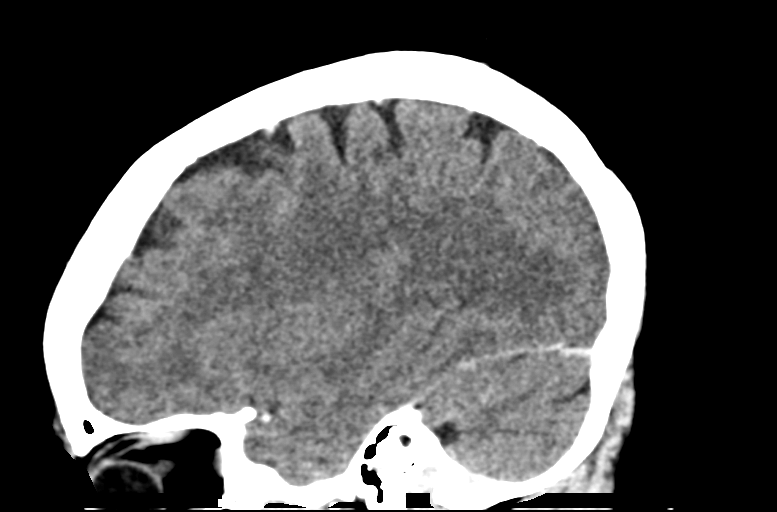
[im 45/67  brain]
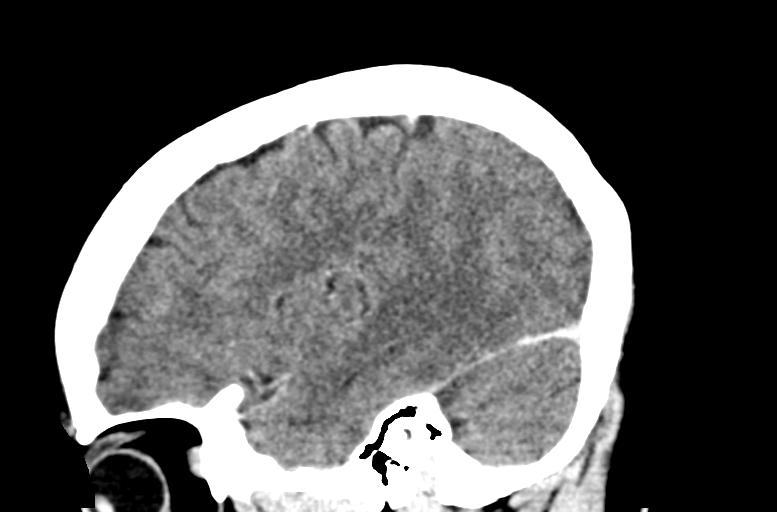

[16 of 47 positions shown; findings below may reference images not displayed]

FINDINGS: Brain: There is no evidence of acute intracranial hemorrhage,
intracranial mass, midline shift or extra-axial fluid collection.No
demarcated cortical infarction. Cerebral volume is normal for age.
No abnormal intracranial enhancement.

Vascular: No hyperdense vessel is demonstrated on non-contrast
imaging. Expected enhancement within the proximal large arterial
vessels and dural venous sinuses.

Skull: Normal. Negative for fracture or focal lesion.

Sinuses/Orbits: Visualized orbits demonstrate no acute abnormality.
No significant paranasal sinus disease or mastoid effusion at the
imaged levels.
IMPRESSION: Unremarkable CT appearance of the brain. No evidence of intracranial
mass or acute intracranial abnormality.

## 2019-07-13 MED ORDER — IOHEXOL 300 MG/ML  SOLN
100.0000 mL | Freq: Once | INTRAMUSCULAR | Status: AC | PRN
  Administered 2019-07-13: 100 mL via INTRAVENOUS

## 2020-04-01 HISTORY — PX: REDUCTION MAMMAPLASTY: SUR839

## 2020-04-01 HISTORY — PX: MASTECTOMY: SHX3

## 2020-07-12 DIAGNOSIS — B373 Candidiasis of vulva and vagina: Secondary | ICD-10-CM | POA: Diagnosis not present

## 2020-07-12 DIAGNOSIS — R7303 Prediabetes: Secondary | ICD-10-CM | POA: Diagnosis not present

## 2020-07-12 DIAGNOSIS — E559 Vitamin D deficiency, unspecified: Secondary | ICD-10-CM | POA: Diagnosis not present

## 2020-07-12 DIAGNOSIS — M7989 Other specified soft tissue disorders: Secondary | ICD-10-CM | POA: Diagnosis not present

## 2020-09-29 DIAGNOSIS — C50211 Malignant neoplasm of upper-inner quadrant of right female breast: Secondary | ICD-10-CM

## 2020-09-29 HISTORY — DX: Estrogen receptor positive status (ER+): C50.211

## 2020-10-03 DIAGNOSIS — N63 Unspecified lump in unspecified breast: Secondary | ICD-10-CM | POA: Diagnosis not present

## 2020-10-04 ENCOUNTER — Other Ambulatory Visit: Payer: Self-pay | Admitting: Family Medicine

## 2020-10-04 DIAGNOSIS — N6310 Unspecified lump in the right breast, unspecified quadrant: Secondary | ICD-10-CM

## 2020-10-06 ENCOUNTER — Ambulatory Visit
Admission: RE | Admit: 2020-10-06 | Discharge: 2020-10-06 | Disposition: A | Discharge status: Home / Self Care | Payer: BC Managed Care – PPO | Source: Ambulatory Visit | Attending: Family Medicine | Admitting: Family Medicine

## 2020-10-06 ENCOUNTER — Other Ambulatory Visit: Payer: Self-pay

## 2020-10-06 ENCOUNTER — Ambulatory Visit
Admission: RE | Admit: 2020-10-06 | Discharge: 2020-10-06 | Disposition: A | Discharge status: Home / Self Care | Payer: 59 | Source: Ambulatory Visit | Attending: Family Medicine | Admitting: Family Medicine

## 2020-10-06 ENCOUNTER — Other Ambulatory Visit: Payer: Self-pay | Admitting: Family Medicine

## 2020-10-06 DIAGNOSIS — N6310 Unspecified lump in the right breast, unspecified quadrant: Secondary | ICD-10-CM

## 2020-10-06 DIAGNOSIS — N6311 Unspecified lump in the right breast, upper outer quadrant: Secondary | ICD-10-CM | POA: Diagnosis not present

## 2020-10-06 DIAGNOSIS — Z170 Estrogen receptor positive status [ER+]: Secondary | ICD-10-CM | POA: Diagnosis not present

## 2020-10-06 DIAGNOSIS — C50911 Malignant neoplasm of unspecified site of right female breast: Secondary | ICD-10-CM | POA: Diagnosis not present

## 2020-10-06 DIAGNOSIS — R922 Inconclusive mammogram: Secondary | ICD-10-CM | POA: Diagnosis not present

## 2020-10-06 IMAGING — MG MM BREAST LOCALIZATION CLIP
4 series · 4 of 12 positions shown · non-contrast
Comparison: Previous exam(s).

CLINICAL DATA: 44-year-old female status post ultrasound-guided
biopsy of the right breast.

EXAM:
3D DIAGNOSTIC RIGHT MAMMOGRAM POST ULTRASOUND BIOPSY

[R CC synth-2D]
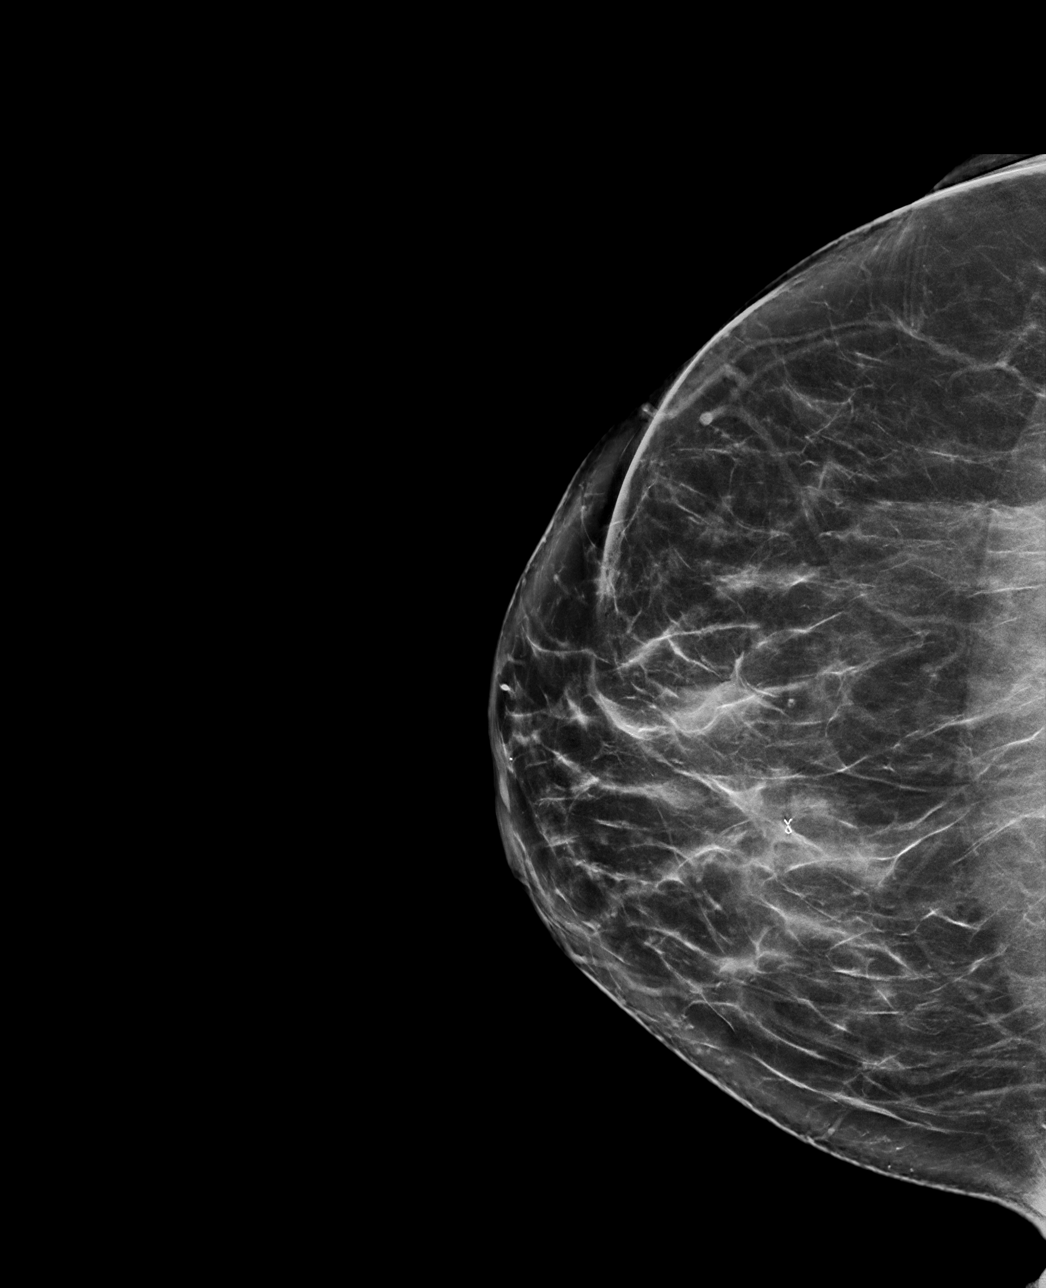

[R ML synth-2D]
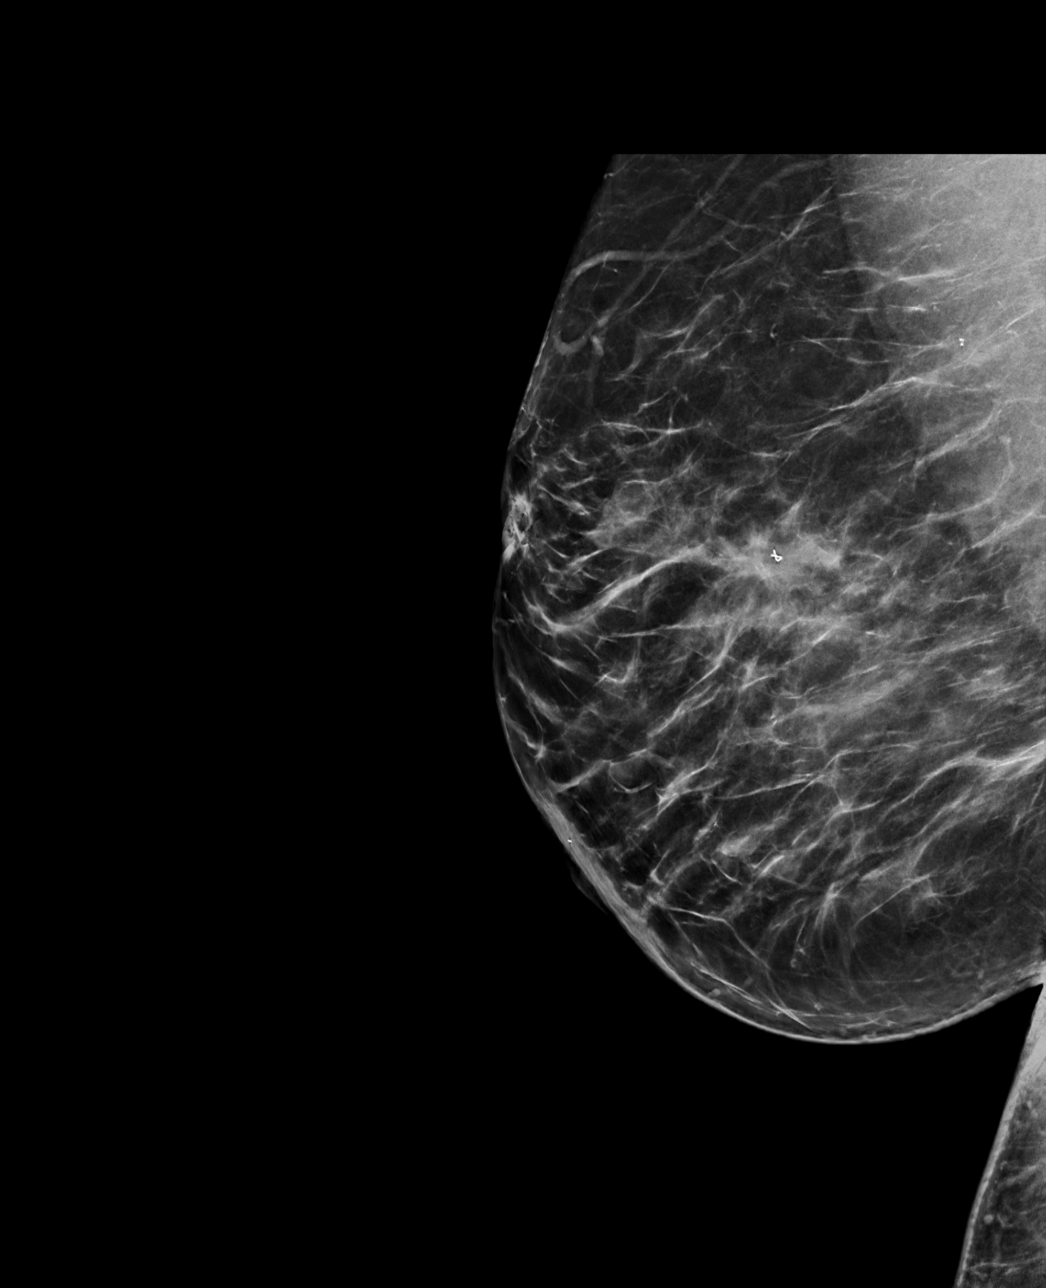

[R CC tomo · tomo slice 39/78.0]
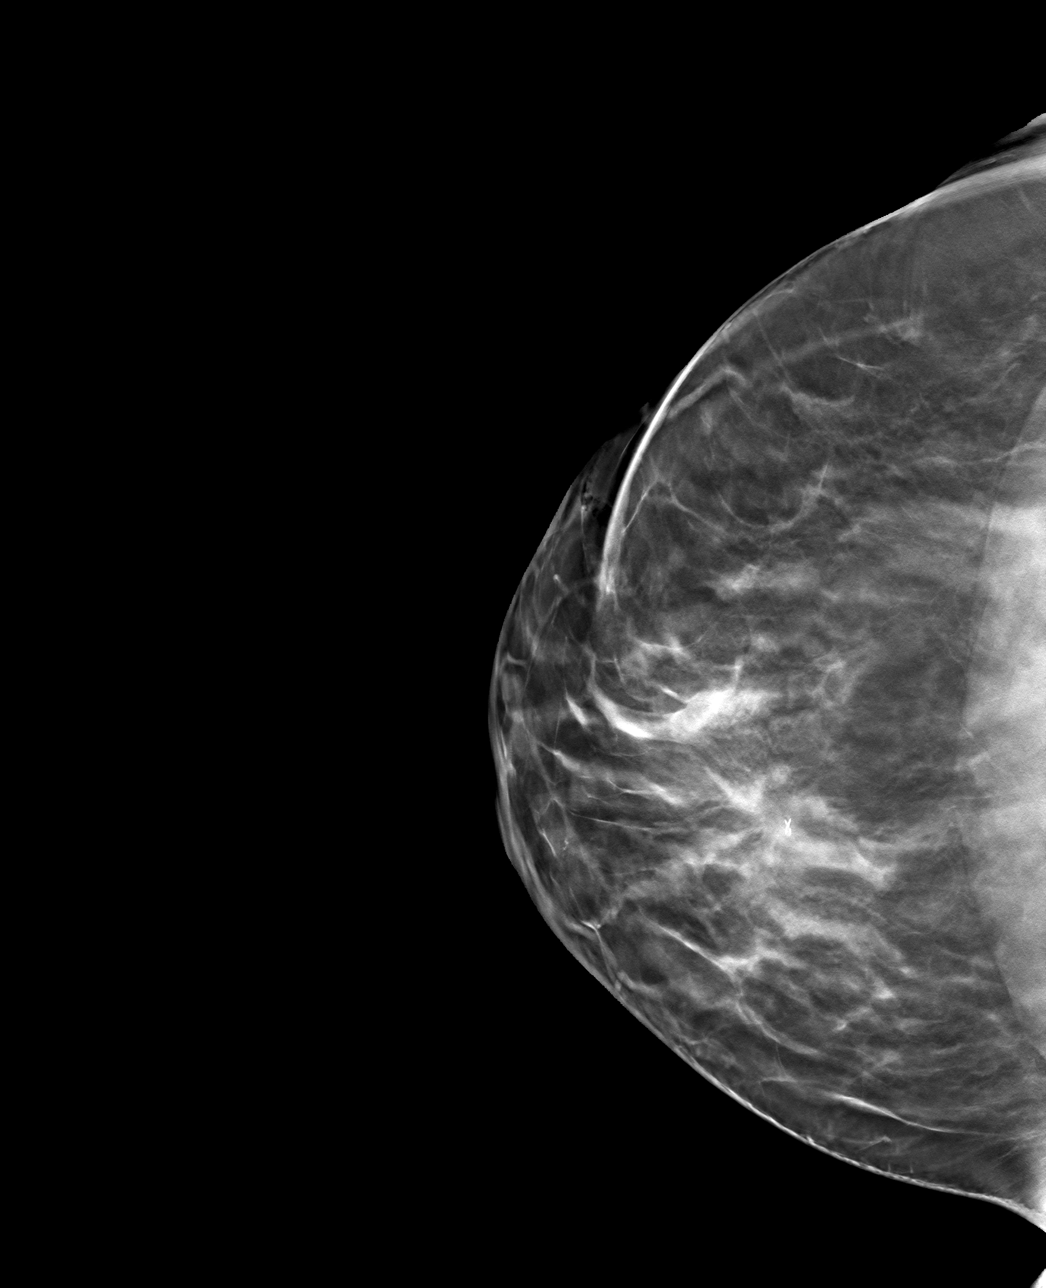

[R ML tomo · tomo slice 38/75.0]
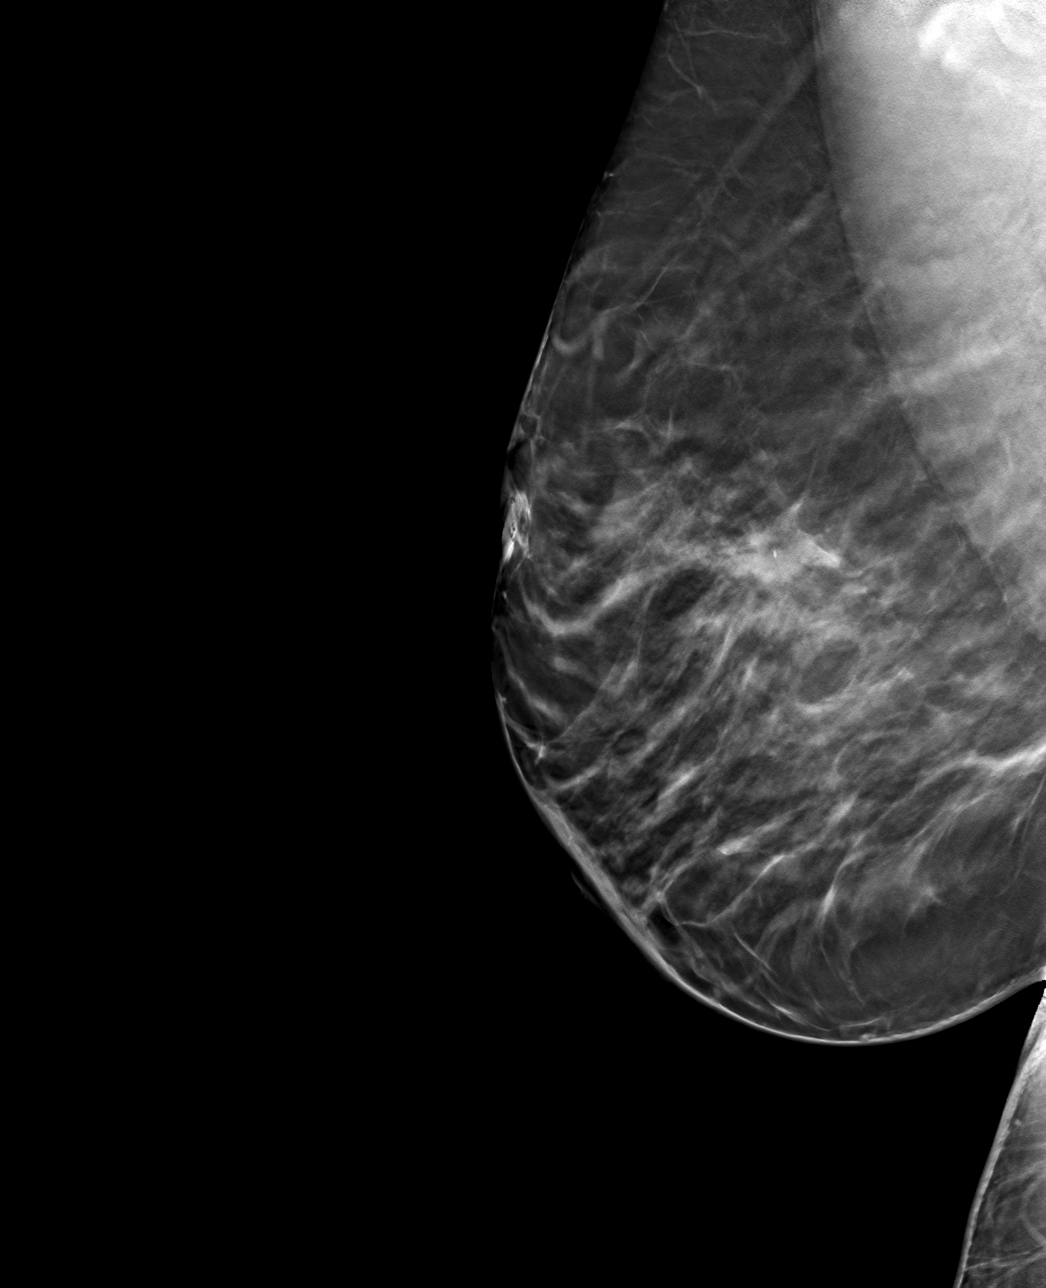

[4 of 12 positions shown; findings below may reference images not displayed]

FINDINGS: 3D Mammographic images were obtained following ultrasound guided
biopsy of the upper right breast. The biopsy marking clip is in
expected position at the site of biopsy.
IMPRESSION: Appropriate positioning of the ribbon shaped biopsy marking clip at
the site of biopsy in the upper right breast.

Final Assessment: Post Procedure Mammograms for Marker Placement

## 2020-10-06 IMAGING — MG DIGITAL DIAGNOSTIC BILAT W/ TOMO W/ CAD
6 of 10 series · 6 of 30 positions shown · non-contrast
Comparison: None.

CLINICAL DATA: 44-year-old patient complaining of a palpable mass
in the [DATE] region of the right breast.

EXAM:
DIGITAL DIAGNOSTIC BILATERAL MAMMOGRAM WITH TOMOSYNTHESIS AND CAD;
ULTRASOUND RIGHT BREAST LIMITED
TECHNIQUE: Bilateral digital diagnostic mammography and breast tomosynthesis
was performed. The images were evaluated with computer-aided
detection.; Targeted ultrasound examination of the right breast was
performed

[L CC synth-2D]
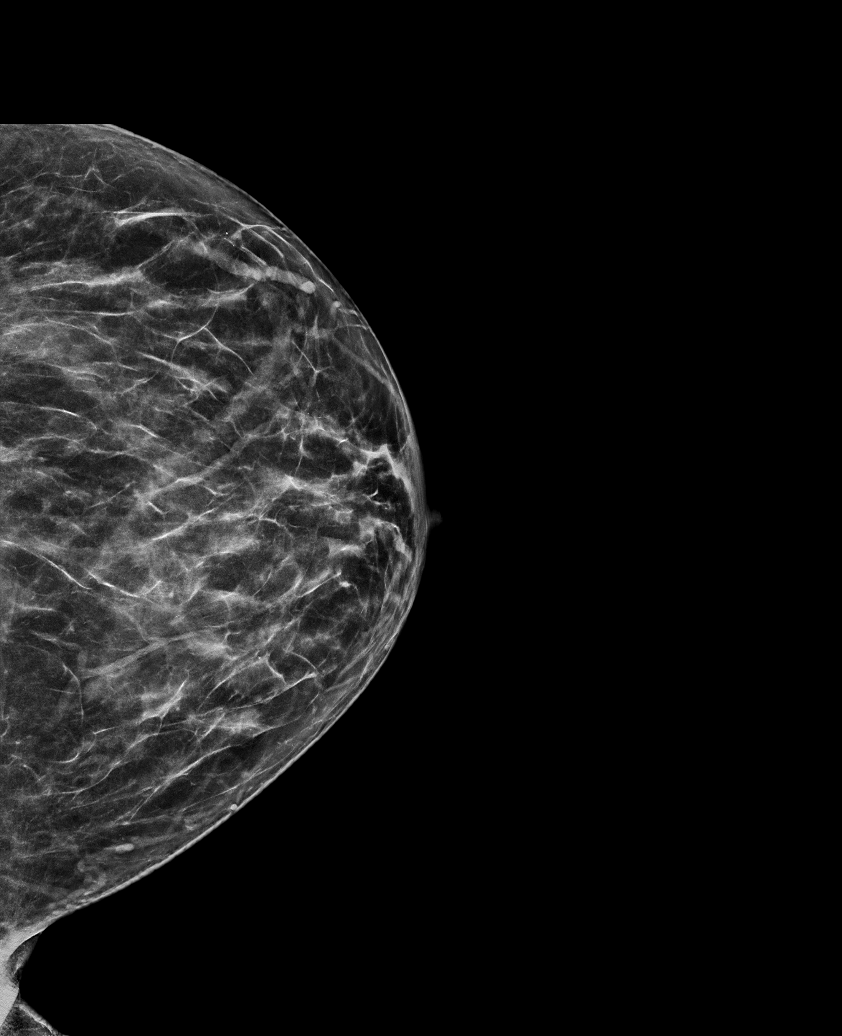

[R CC synth-2D]
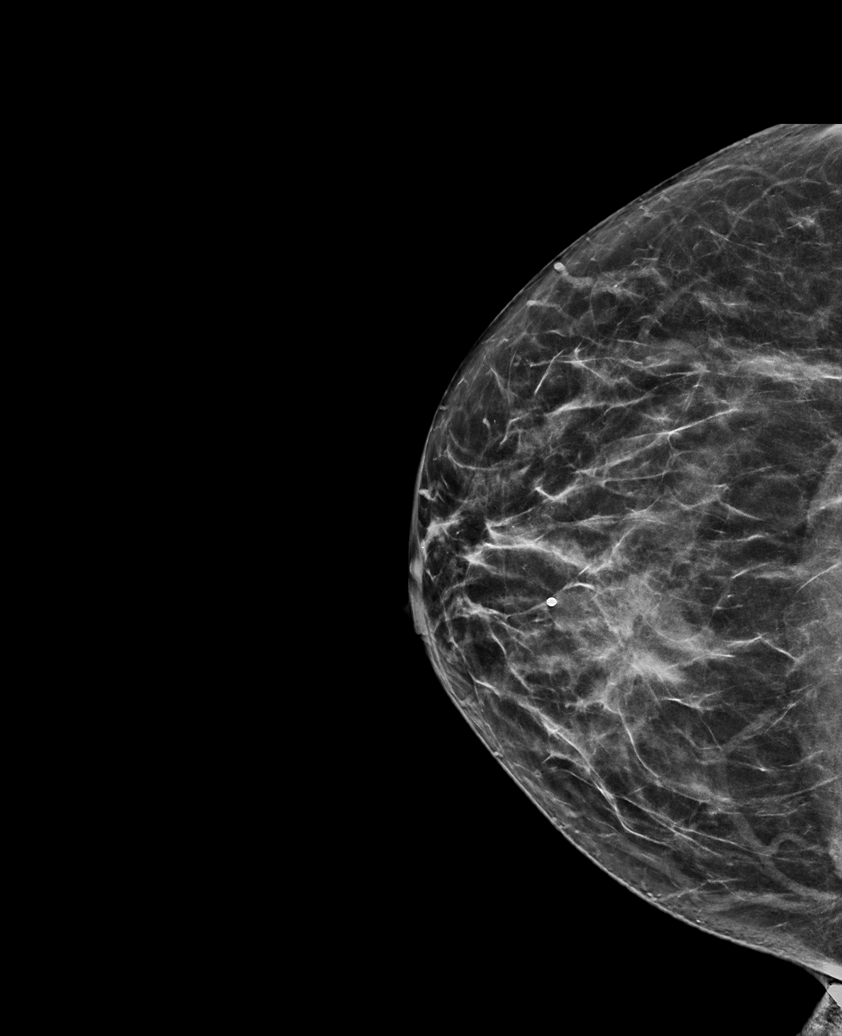

[R MLO synth-2D (1 of 2)]
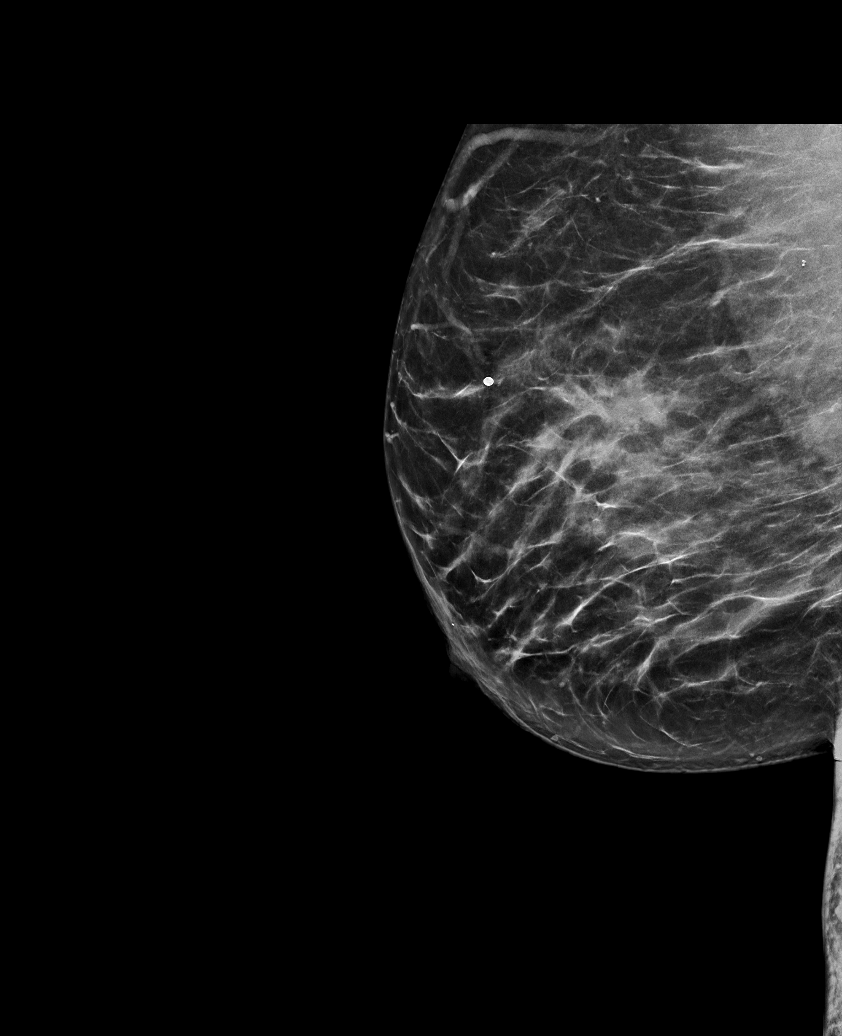

[R MLO synth-2D (2 of 2)]
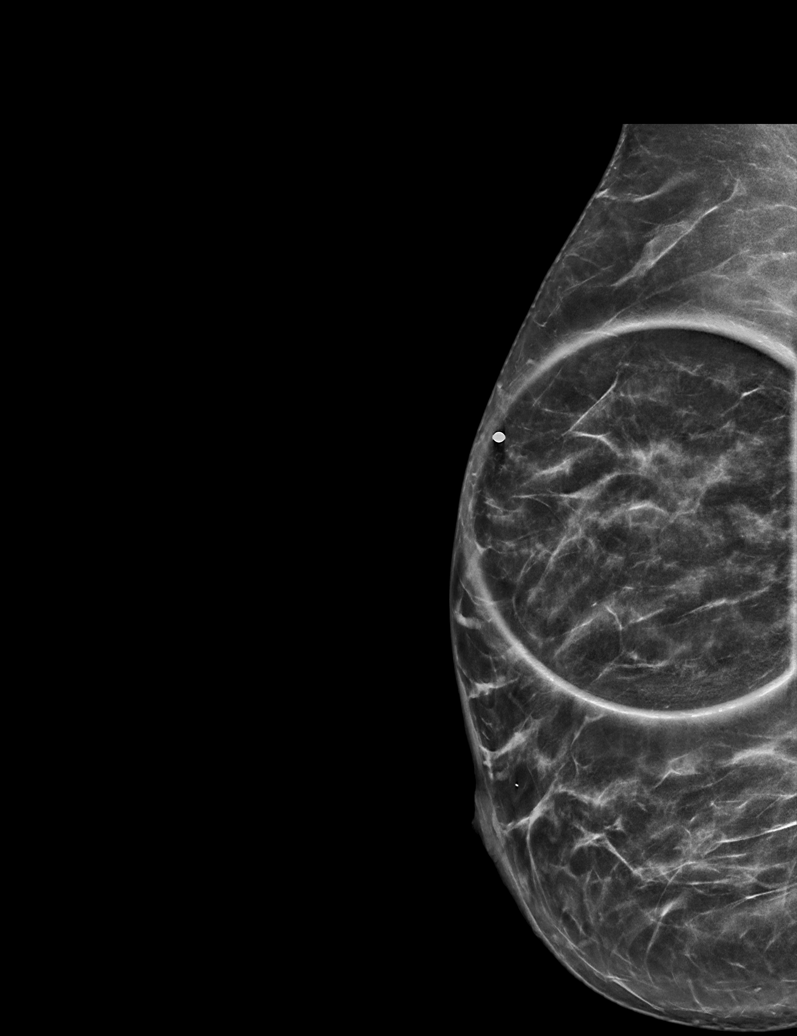

[L MLO synth-2D]
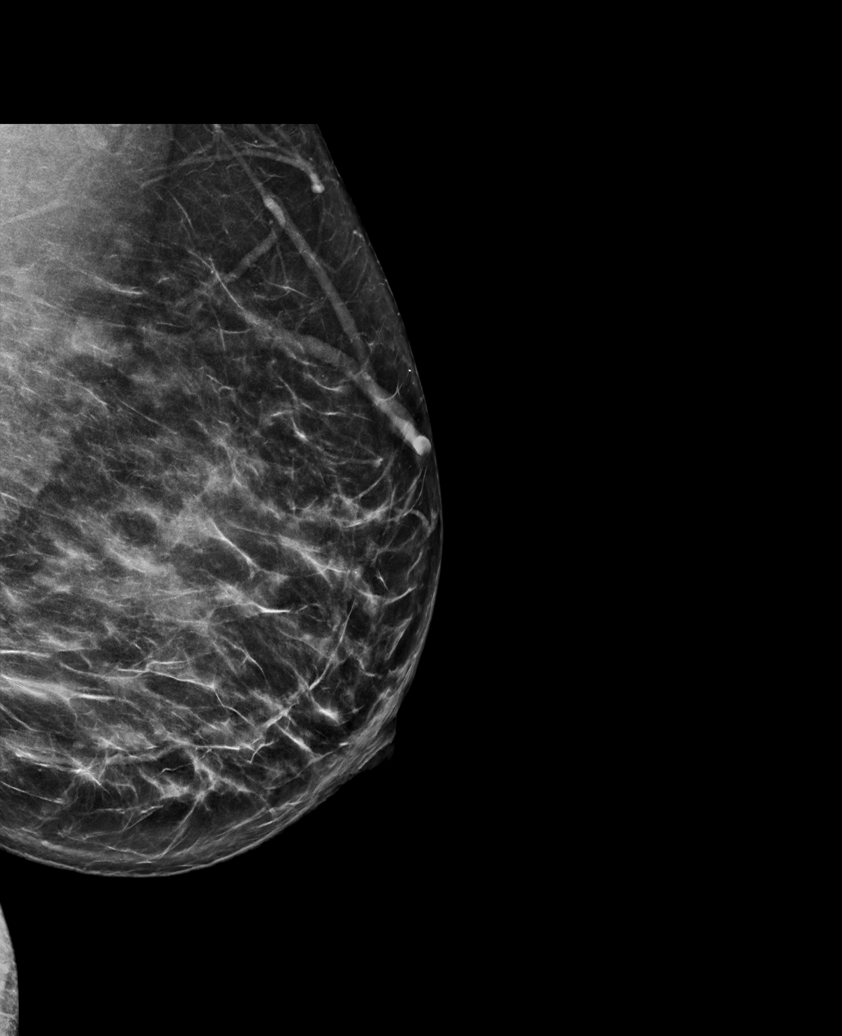

[R MLO tomo · tomo slice 37/74.0]
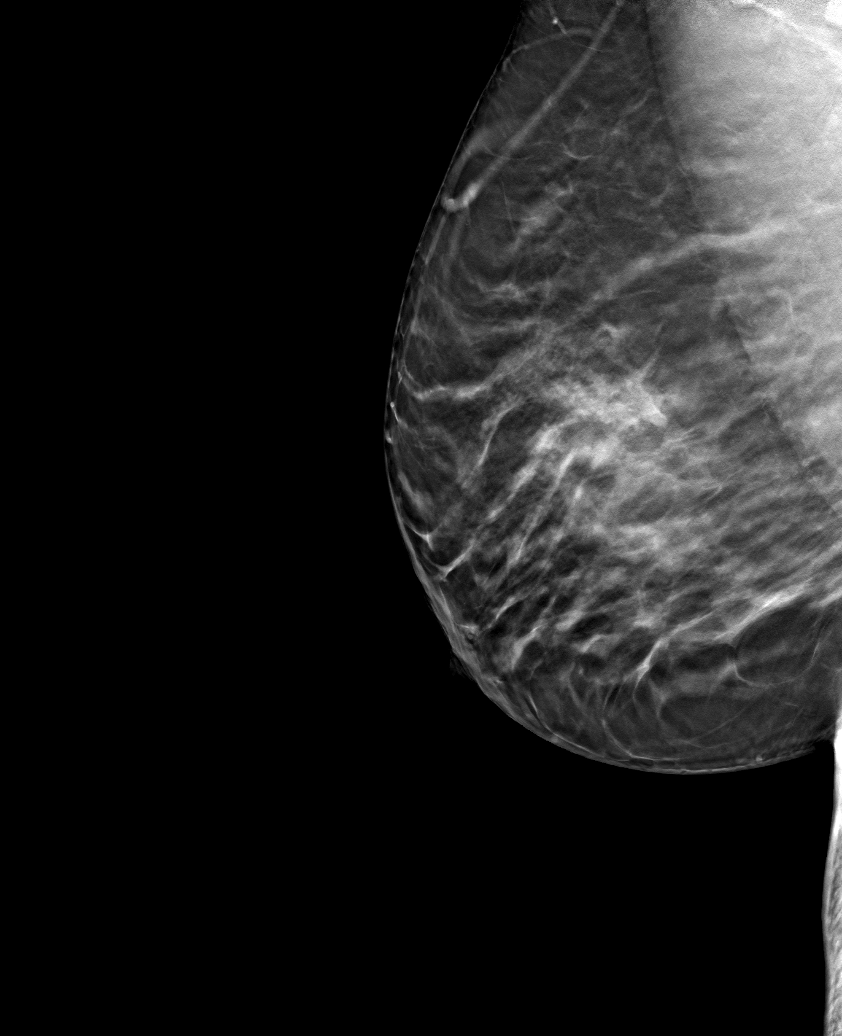

[6 of 30 positions shown; findings below may reference images not displayed]

ACR Breast Density Category c: The breast tissue is heterogeneously
dense, which may obscure small masses.
FINDINGS: No suspicious mass or malignant type microcalcifications identified
in the left breast.

There is a spiculated mass with obscured borders in the upper inner
quadrant of the right breast, middle depth. There are no malignant
type microcalcifications.

On physical exam, I palpate a discrete mass in the right breast at
[DATE] 3 cm from the nipple.

Targeted ultrasound is performed, showing an irregular hypoechoic
mass in the right breast at [DATE] 3 cm from the nipple measuring
x 2.1 x 3.0 cm. Directly inferior and probably contiguous at [DATE] 2
cm from the nipple is a hypoechoic heterogeneous mass measuring
x 1.6 x 2.8 cm. Sonographic evaluation of the right axilla does not
show any enlarged adenopathy.
IMPRESSION: Two contiguous hypoechoic masses in the right breast at [DATE] 3 cm
from the nipple and at [DATE] 2 cm from the nipple.

RECOMMENDATION:
Ultrasound-guided core biopsy of the masses in the right breast is
recommended.

I have discussed the findings and recommendations with the patient.
If applicable, a reminder letter will be sent to the patient
regarding the next appointment.

BI-RADS CATEGORY  5: Highly suggestive of malignancy.

## 2020-10-06 IMAGING — US US BREAST*R* LIMITED INC AXILLA
1 series · 13 of 25 positions shown · non-contrast
Comparison: None.

CLINICAL DATA: 44-year-old patient complaining of a palpable mass
in the [DATE] region of the right breast.

EXAM:
DIGITAL DIAGNOSTIC BILATERAL MAMMOGRAM WITH TOMOSYNTHESIS AND CAD;
ULTRASOUND RIGHT BREAST LIMITED
TECHNIQUE: Bilateral digital diagnostic mammography and breast tomosynthesis
was performed. The images were evaluated with computer-aided
detection.; Targeted ultrasound examination of the right breast was
performed

[Series 1: us breast*right* limited inc axilla · 0.07mm/px · 25 acquisitions, 13 frames shown]
[im 1/25]
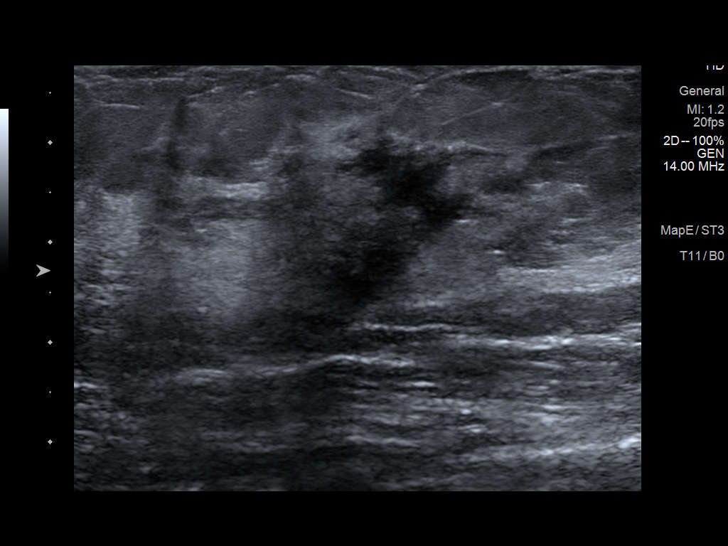
[im 3/25]
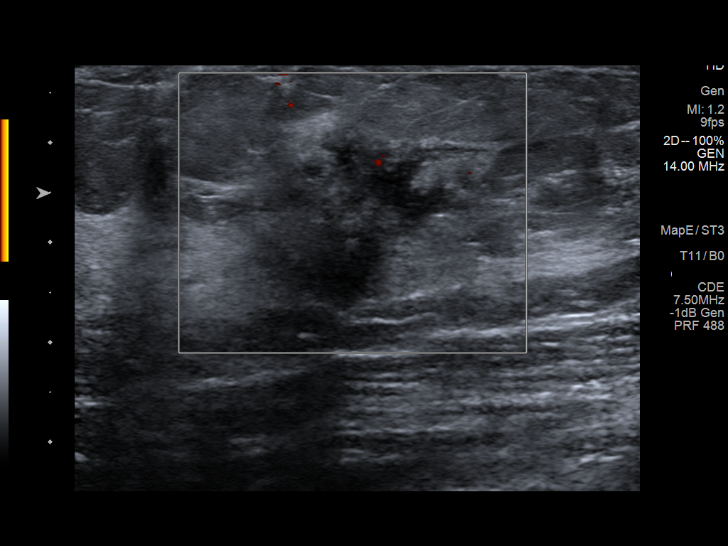
[im 5/25]
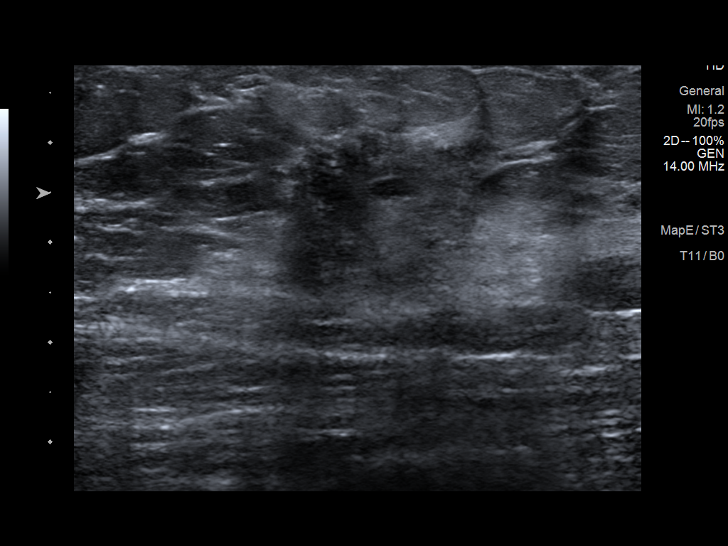
[im 7/25]
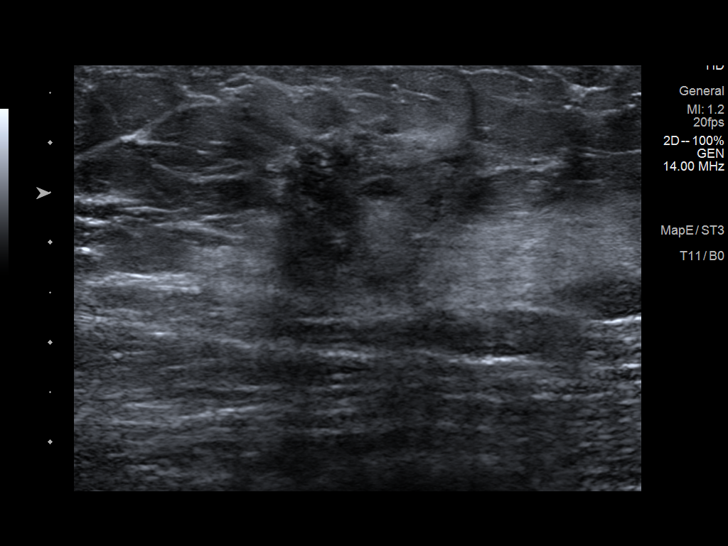
[im 9/25]
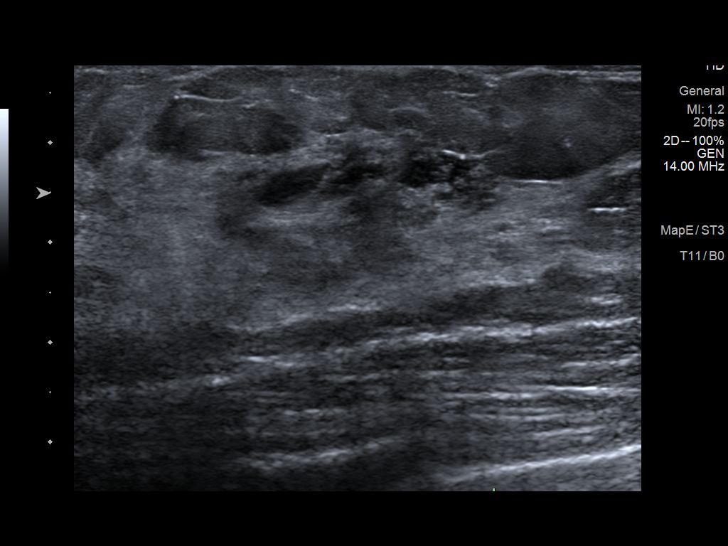
[im 11/25]
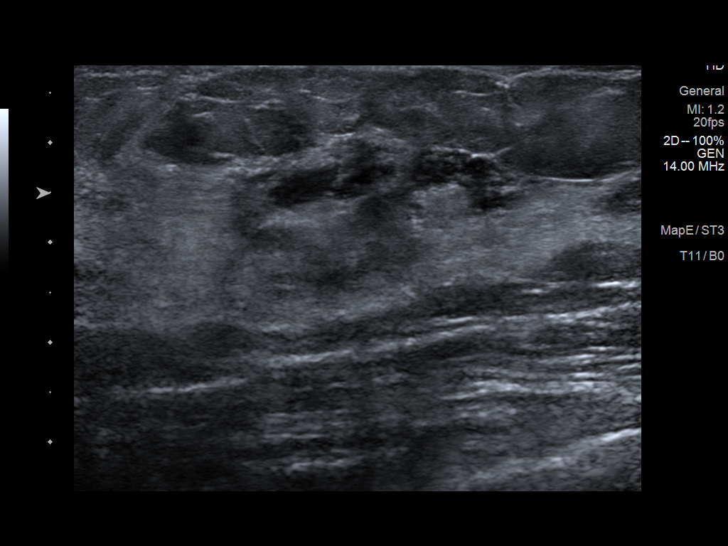
[im 13/25]
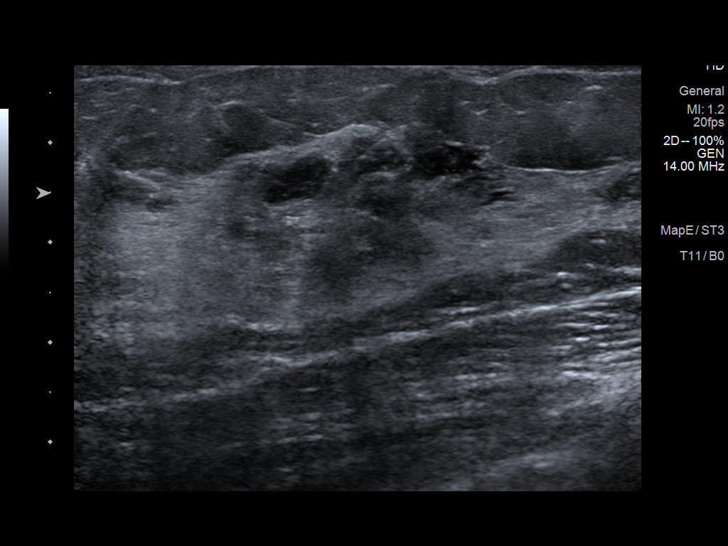
[im 15/25]
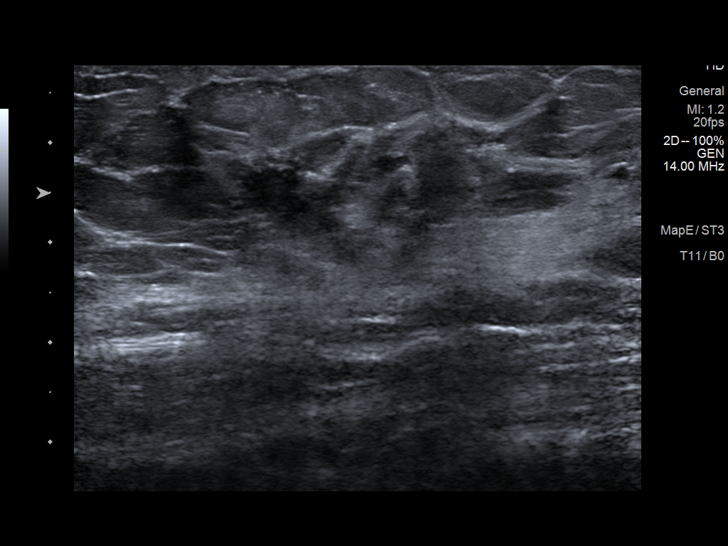
[im 17/25]
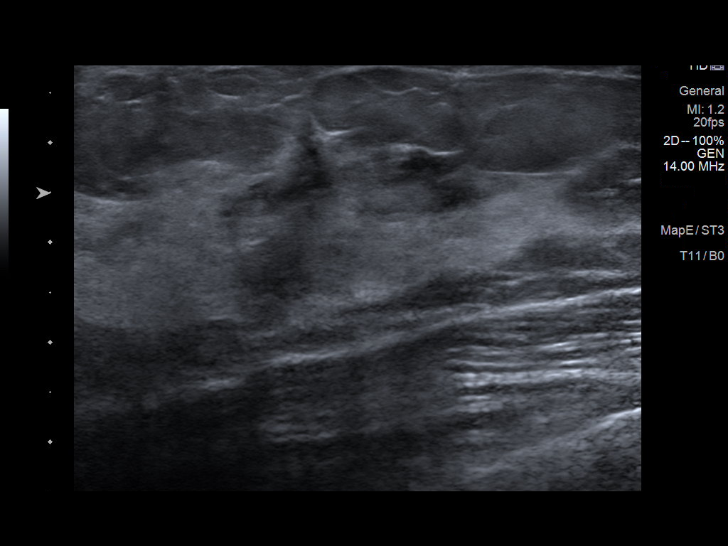
[im 19/25]
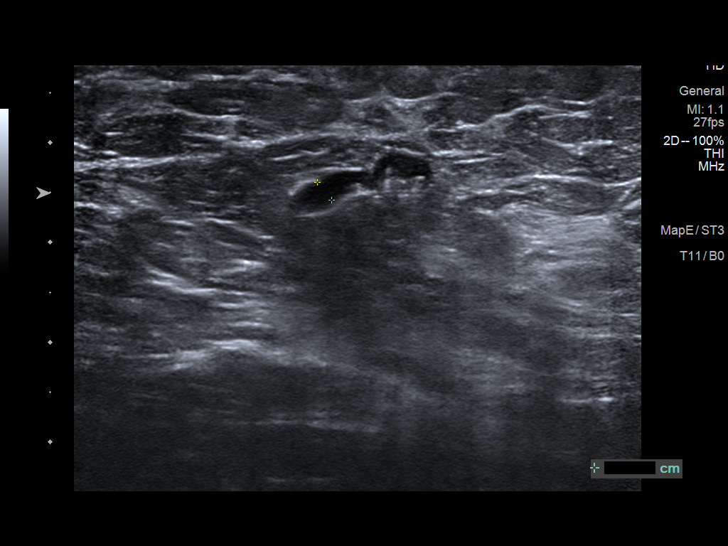
[im 21/25]
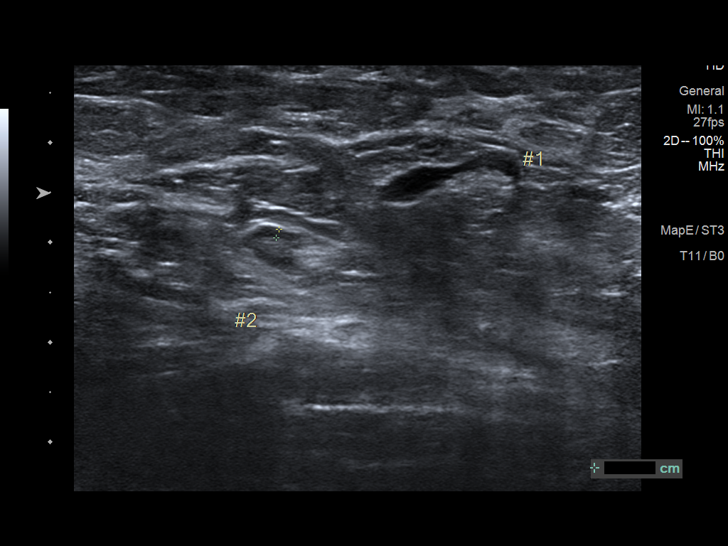
[im 23/25]
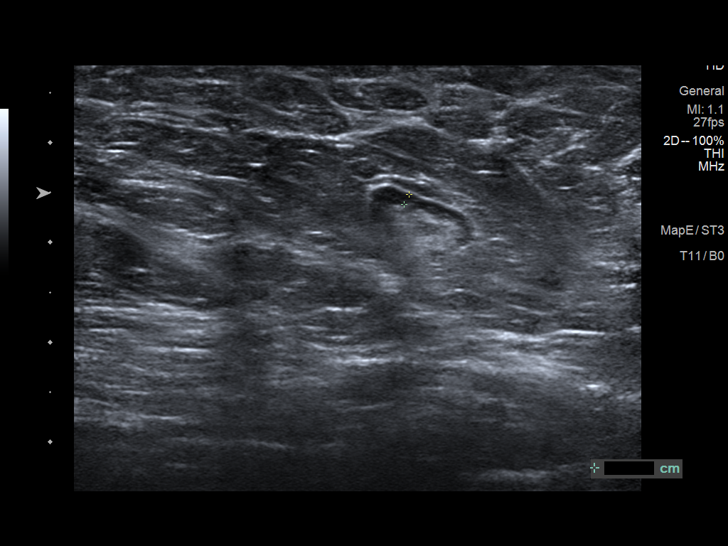
[im 25/25]
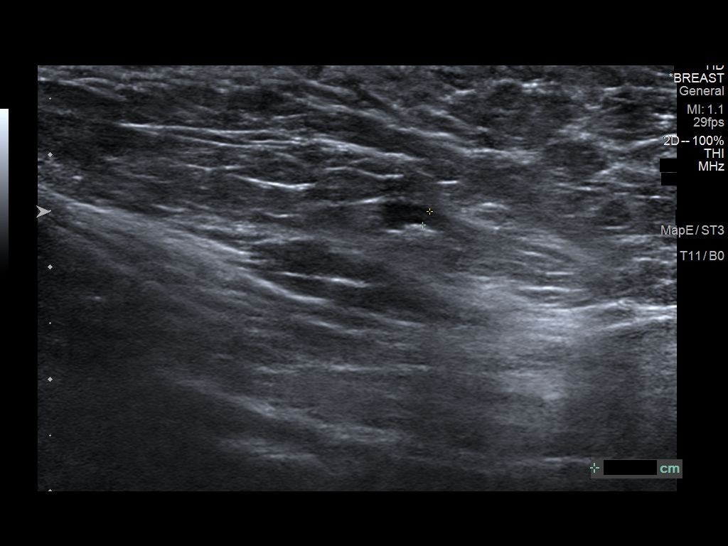

[13 of 25 positions shown; findings below may reference images not displayed]

ACR Breast Density Category c: The breast tissue is heterogeneously
dense, which may obscure small masses.
FINDINGS: No suspicious mass or malignant type microcalcifications identified
in the left breast.

There is a spiculated mass with obscured borders in the upper inner
quadrant of the right breast, middle depth. There are no malignant
type microcalcifications.

On physical exam, I palpate a discrete mass in the right breast at
[DATE] 3 cm from the nipple.

Targeted ultrasound is performed, showing an irregular hypoechoic
mass in the right breast at [DATE] 3 cm from the nipple measuring
x 2.1 x 3.0 cm. Directly inferior and probably contiguous at [DATE] 2
cm from the nipple is a hypoechoic heterogeneous mass measuring
x 1.6 x 2.8 cm. Sonographic evaluation of the right axilla does not
show any enlarged adenopathy.
IMPRESSION: Two contiguous hypoechoic masses in the right breast at [DATE] 3 cm
from the nipple and at [DATE] 2 cm from the nipple.

RECOMMENDATION:
Ultrasound-guided core biopsy of the masses in the right breast is
recommended.

I have discussed the findings and recommendations with the patient.
If applicable, a reminder letter will be sent to the patient
regarding the next appointment.

BI-RADS CATEGORY  5: Highly suggestive of malignancy.

## 2020-10-06 IMAGING — US US  BREAST BX W/ LOC DEV 1ST LESION IMG BX SPEC US GUIDE*R*
1 series · 11 of 11 positions shown · non-contrast
Comparison: Previous exam(s).
COMPARISON: Previous exam(s).

Addendum:
CLINICAL DATA: 44-year-old female with a highly suspicious palpable
right breast mass.

EXAM:
ULTRASOUND GUIDED RIGHT BREAST CORE NEEDLE BIOPSY

[Series 1: us breast bx w/ loc dev 1st lesion img bx spec us  · 0.07mm/px · 11 of 11 slices shown]
[im 1/11]
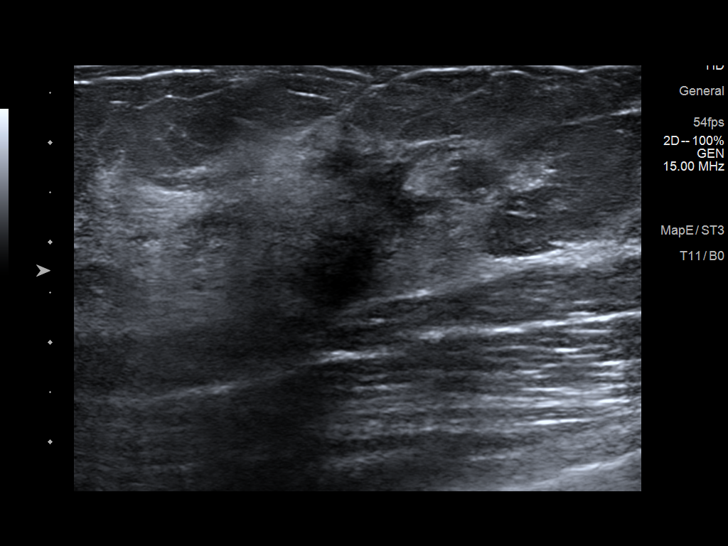
[im 2/11]
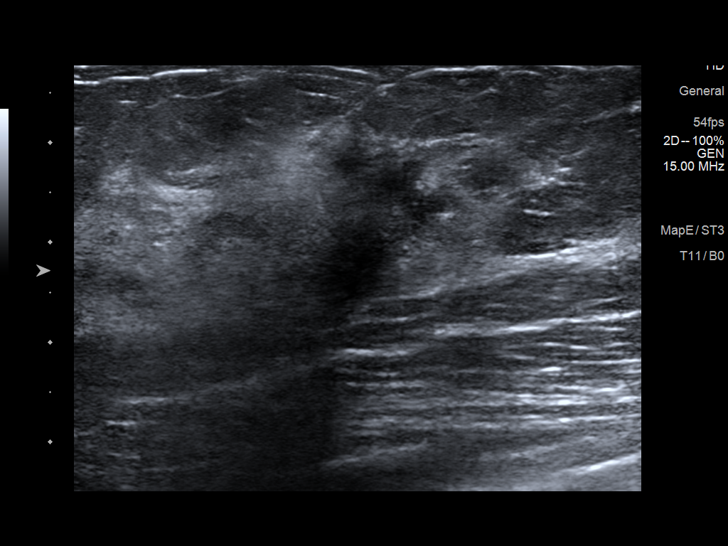
[im 3/11]
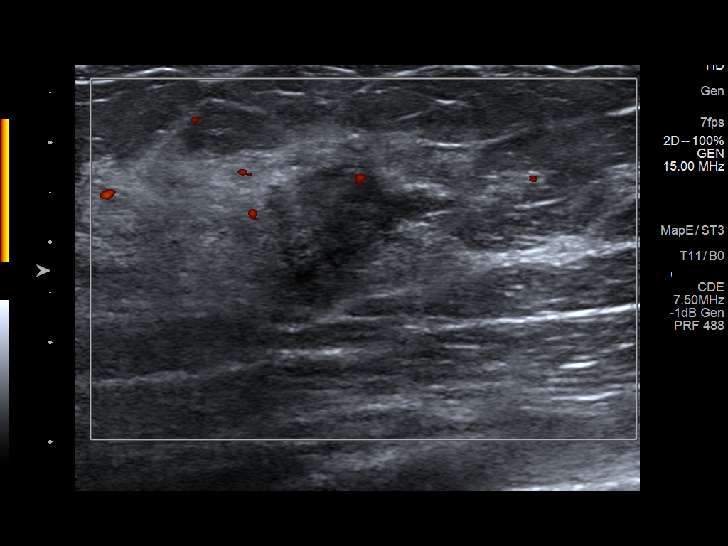
[im 4/11]
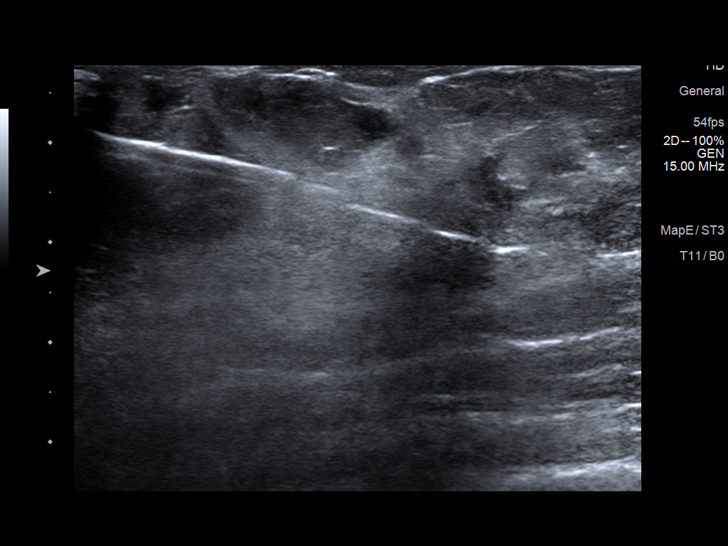
[im 5/11]
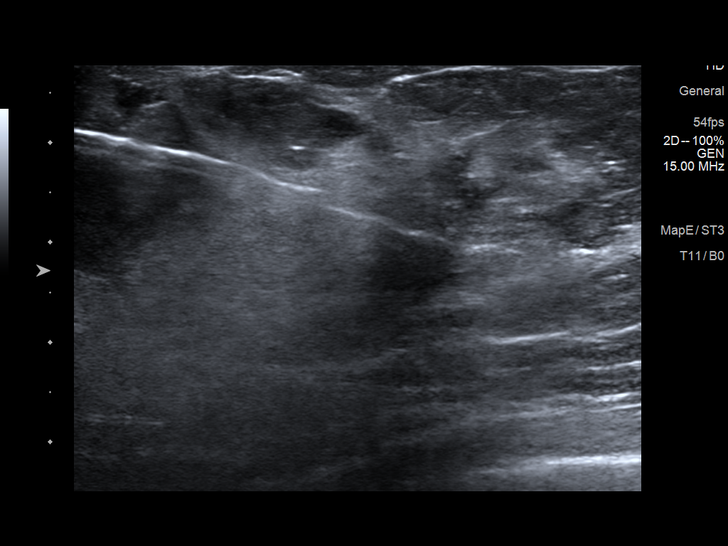
[im 6/11]
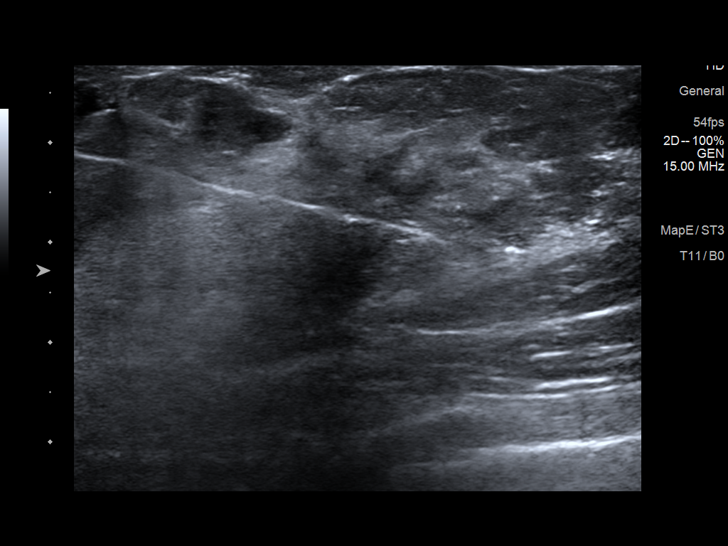
[im 7/11]
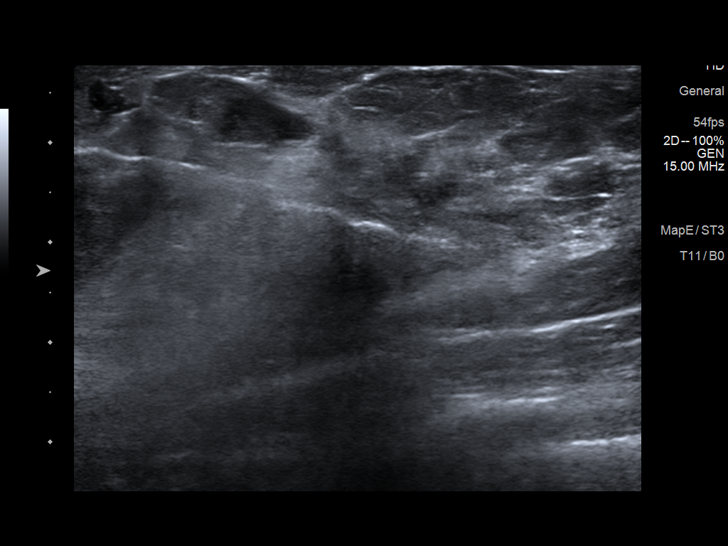
[im 8/11]
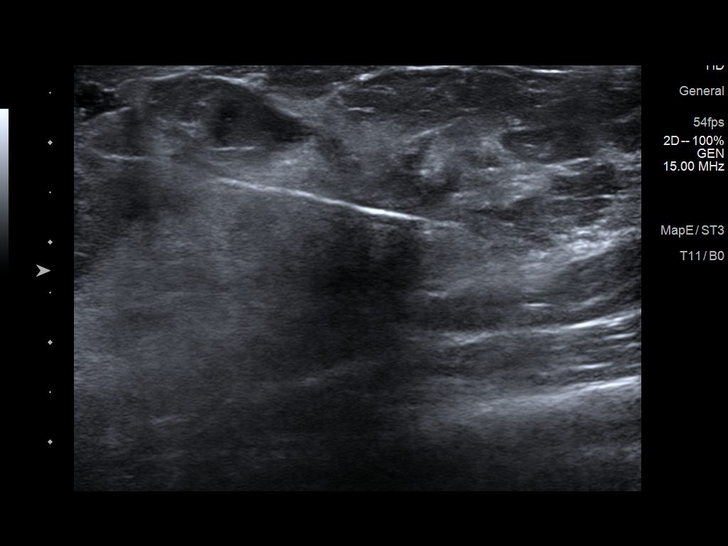
[im 9/11]
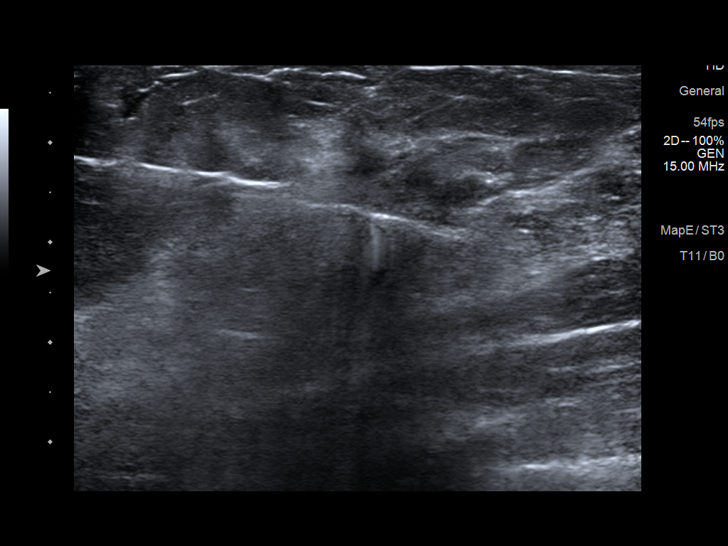
[im 10/11]
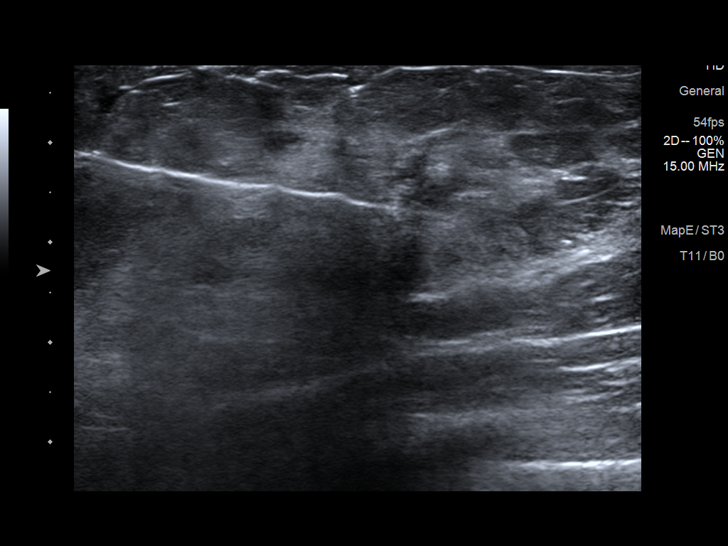
[im 11/11]
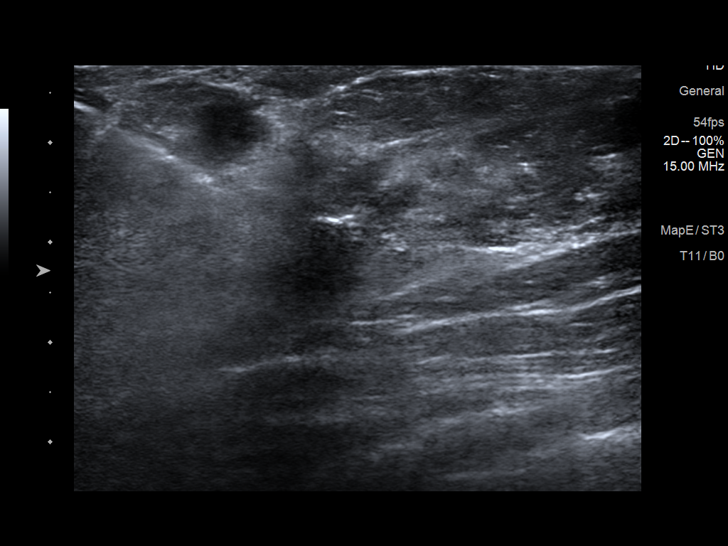

[11 of 11 positions shown; findings below may reference images not displayed]



Lesion quadrant: Upper outer quadrant

At the time of preprocedural scanning, a vague mass with ill-defined
borders at the [DATE] position 3 cm from the nipple was identified
and targeted. A definite, separate mass could not be reproduced upon
real-time scanning. Therefore, a single area biopsy was performed.

Using sterile technique and 1% Lidocaine as local anesthetic, under
direct ultrasound visualization, a 14 gauge KNUTH device was
used to perform biopsy of the right breast at the [DATE] position
using a lateral approach. At the conclusion of the procedure a
ribbon shaped tissue marker clip was deployed into the biopsy
cavity. Follow up 2 view mammogram was performed and dictated
separately.
IMPRESSION: Ultrasound guided biopsy of the right breast. No apparent
complications.

ADDENDUM:
Pathology revealed GRADE II INVASIVE MAMMARY CARCINOMA WITH LOBULAR
FEATURES of the Right breast, 12:30 o'clock, 3 cmfn. This was found
to be concordant by Dr. KNUTH.

Pathology results were discussed with the patient by telephone. The
patient reported doing well after the biopsy with tenderness at the
site. Post biopsy instructions and care were reviewed and questions
were answered. The patient was encouraged to call The [REDACTED] for any additional concerns. My direct phone
number was provided.

The patient was referred to [REDACTED]
[REDACTED] at [REDACTED] on
[DATE].

Recommendation for a bilateral breast MRI to exclude any additional
sites of disease given heterogeneously dense breast tissue and age.

Pathology results reported by KNUTH, RN on [DATE].



Lesion quadrant: Upper outer quadrant

At the time of preprocedural scanning, a vague mass with ill-defined
borders at the [DATE] position 3 cm from the nipple was identified
and targeted. A definite, separate mass could not be reproduced upon
real-time scanning. Therefore, a single area biopsy was performed.

Using sterile technique and 1% Lidocaine as local anesthetic, under
direct ultrasound visualization, a 14 gauge KNUTH device was
used to perform biopsy of the right breast at the [DATE] position
using a lateral approach. At the conclusion of the procedure a
ribbon shaped tissue marker clip was deployed into the biopsy
cavity. Follow up 2 view mammogram was performed and dictated
separately.
IMPRESSION: Ultrasound guided biopsy of the right breast. No apparent
complications.

## 2020-10-10 ENCOUNTER — Telehealth: Payer: Self-pay | Admitting: Hematology

## 2020-10-10 NOTE — Telephone Encounter (Signed)
Spoke to patient to confirm morning [facility] appointment for 7/20, sent packet via email

## 2020-10-13 ENCOUNTER — Encounter: Payer: Self-pay | Admitting: *Deleted

## 2020-10-13 DIAGNOSIS — Z170 Estrogen receptor positive status [ER+]: Secondary | ICD-10-CM

## 2020-10-13 DIAGNOSIS — C50211 Malignant neoplasm of upper-inner quadrant of right female breast: Secondary | ICD-10-CM | POA: Insufficient documentation

## 2020-10-17 NOTE — Progress Notes (Addendum)
[place]   [phone]) [phone] [phone]) [place] Note   Patient Care Team: Kristen Loader, FNP as PCP - General (Family Medicine) Mauro Kaufmann, RN as Oncology Nurse Navigator Rockwell Germany, RN as Oncology Nurse Navigator Rolm Bookbinder, MD as Consulting Physician (General Surgery) Truitt Merle, MD as Consulting Physician (Hematology) Gery Pray, MD as Consulting Physician (Radiation Oncology)  Date of Service:  10/18/2020   CHIEF COMPLAINTS/PURPOSE OF CONSULTATION:  Right Breast Cancer, ER+  REFERRING PHYSICIAN:  Breast cancer   Oncology History Overview Note  Cancer Staging Malignant neoplasm of upper-inner quadrant of right breast in female, estrogen receptor positive ([place]) Staging form: Breast, AJCC 8th Edition - Clinical stage from 10/06/2020: Stage IB (cT2, cN0, cM0, G2, ER+, PR+, HER2-) - Signed by Truitt Merle, MD on 10/17/2020 Stage prefix: Initial diagnosis Histologic grading system: 3 grade system    Malignant neoplasm of upper-inner quadrant of right breast in female, estrogen receptor positive ([place])  10/06/2020 Mammogram   DIGITAL DIAGNOSTIC BILATERAL MAMMOGRAM WITH TOMOSYNTHESIS AND CAD; ULTRASOUND RIGHT BREAST LIMITED  IMPRESSION: Two contiguous hypoechoic masses in the right breast- an irregular hypoechoic mass in the right breast at 12:30 3 cm from the nipple measuring 2.5 x 2.1 x 3.0 cm, and a hypoechoic heterogeneous mass at 12:30 2 cm from the nipple measuring 3.0 x 1.6 x 2.8 cm. Sonographic evaluation of the right axilla does not show any enlarged adenopathy.   10/06/2020 Pathology Results   Diagnosis Breast, right, needle core biopsy - INVASIVE MAMMARY CARCINOMA WITH LOBULAR FEATURES - SEE COMMENT Microscopic Comment The biopsy material shows an infiltrative proliferation of cells with large vesicular nuclei with inconspicuous nucleoli, arranged linearly and in small clusters. Based on the biopsy, the  carcinoma appears Nottingham grade 2 of 3 and measures 0.9 cm in greatest linear extent  E-cadherin is POSITIVE supporting a ductal origin.  PROGNOSTIC INDICATORS Results: IMMUNOHISTOCHEMICAL AND MORPHOMETRIC ANALYSIS PERFORMED MANUALLY The tumor cells are NEGATIVE for Her2 (1+). Estrogen Receptor: 80%, POSITIVE, STRONG STAINING INTENSITY Progesterone Receptor: 90%, POSITIVE, STRONG STAINING INTENSITY Proliferation Marker Ki67: 10%   10/06/2020 Cancer Staging   Staging form: Breast, AJCC 8th Edition - Clinical stage from 10/06/2020: Stage IB (cT2, cN0, cM0, G2, ER+, PR+, HER2-) - Signed by Truitt Merle, MD on 10/17/2020  Stage prefix: Initial diagnosis  Histologic grading system: 3 grade system    10/13/2020 Initial Diagnosis   Malignant neoplasm of upper-inner quadrant of right breast in female, estrogen receptor positive ([place])      HISTORY OF PRESENTING ILLNESS:  Connie Underwood 45 y.o. female is a here because of breast cancer. The patient was referred by breast cnacer. The patient presents to the clinic today accompanied by her husband.   She presented with a palpable mass at 12:30 in the right breast about 3 weeks ago. No breat pain or edema, or discharge.  No constitutional symptoms.  She underwent bilateral diagnostic mammography and right breast ultrasonography on 10/06/20 showing: two contiguous hypoechoic masses in right breast at 12:30, both measuring 3 cm, corresponding to palpable abnormality; no enlarged right axillary adenopathy.  Biopsy on 10/06/20 showed: invasive mammary carcinoma with lobular features, e-cadherin positive, grade 2. ER+, PR+, Her2-. Ki67 of 10%.  Today the patient notes she feels well, no pain, fatigue, or other complaints.  She has good appetite and energy is normal.  Recent weight loss.   GYN HISTORY  Menarchal: 11 LMP: 10/05/2020 Contraceptive:no HRT: N/A G5P4: (+) breast  feeding for one child     REVIEW OF SYSTEMS:    Constitutional: Denies  fevers, chills or abnormal night sweats, no recent weight loss  Eyes: Denies blurriness of vision, double vision or watery eyes Ears, nose, mouth, throat, and face: Denies mucositis or sore throat Respiratory: Denies cough, dyspnea or wheezes Cardiovascular: Denies palpitation, chest discomfort or lower extremity swelling Gastrointestinal:  Denies nausea, heartburn or change in bowel habits Skin: Denies abnormal skin rashes Lymphatics: Denies new lymphadenopathy or easy bruising Neurological:Denies numbness, tingling or new weaknesses, (+) back pain for past 3 days possible form muscular  Behavioral/Psych: Mood is stable, no new changes  All other systems were reviewed with the patient and are negative.   MEDICAL HISTORY:  Past Medical History:  Diagnosis Date   Breast cancer ([place])    Family history of prostate cancer 10/18/2020   Hx of migraines    Seasonal allergic reaction     SURGICAL HISTORY: Past Surgical History:  Procedure Laterality Date   TUBAL LIGATION      SOCIAL HISTORY: Social History   Socioeconomic History   Marital status: Single    Spouse name: Not on file   Number of children: 4   Years of education: Not on file   Highest education level: Not on file  Occupational History   Not on file  Tobacco Use   Smoking status: Never   Smokeless tobacco: Never  Vaping Use   Vaping Use: Never used  Substance and Sexual Activity   Alcohol use: No   Drug use: No   Sexual activity: Not on file  Other Topics Concern   Not on file  Social History Narrative   Not on file   Social Determinants of Health   Financial Resource Strain: Medium Risk   Difficulty of Paying Living Expenses: Somewhat hard  Food Insecurity: Not on file  Transportation Needs: Unmet Transportation Needs   Lack of Transportation (Medical): Yes   Lack of Transportation (Non-Medical): No  Physical Activity: Not on file  Stress: Not on file  Social Connections: Not on file  Intimate  Partner Violence: Not on file    FAMILY HISTORY: Family History  Problem Relation Age of Onset   Prostate cancer Maternal Uncle        dx after 72   Cancer Maternal Uncle        unknown type dx after 50; prostate? lung?   Prostate cancer Paternal Uncle        3 paternal uncles with prostate cancer dx after 22   Prostate cancer Paternal Grandfather        dx after 33    ALLERGIES:  is allergic to tomato and pollen extract.  MEDICATIONS:  Current Outpatient Medications  Medication Sig Dispense Refill   tamoxifen (NOLVADEX) 20 MG tablet Take 1 tablet (20 mg total) by mouth daily. 30 tablet 2   EMGALITY 120 MG/ML SOAJ Inject 1 mL into the skin every 30 (thirty) days.     escitalopram (LEXAPRO) 10 MG tablet Take 10 mg by mouth daily.     SUMAtriptan (IMITREX) 50 MG tablet      topiramate (TOPAMAX) 25 MG tablet      Vitamin D, Ergocalciferol, (DRISDOL) 1.25 MG (50000 UNIT) CAPS capsule Take 50,000 Units by mouth once a week.     No current facility-administered medications for this visit.    PHYSICAL EXAMINATION: ECOG PERFORMANCE STATUS: 0 - Asymptomatic  Vitals:   10/18/20 0843  BP: 123/68  Pulse:  74  Resp: 18  Temp: 97.7 F (36.5 C)  SpO2: 100%   Filed Weights   10/18/20 0843  Weight: 242 lb (109.8 kg)    GENERAL:alert, no distress and comfortable SKIN: skin color, texture, turgor are normal, no rashes or significant lesions EYES: normal, Conjunctiva are pink and non-injected, sclera clear NECK: supple, thyroid normal size, non-tender, without nodularity LYMPH:  no palpable lymphadenopathy in the cervical, axillary  LUNGS: clear to auscultation and percussion with normal breathing effort HEART: regular rate & rhythm and no murmurs and no lower extremity edema ABDOMEN:abdomen soft, non-tender and normal bowel sounds Musculoskeletal:no cyanosis of digits and no clubbing  NEURO: alert & oriented x 3 with fluent speech, no focal motor/sensory deficits BREAST: she has  a 4X3cm palpable mass at 12:00 position of her right breast. No other palpable mass, nodules or adenopathy bilaterally.   LABORATORY DATA:  I have reviewed the data as listed CBC Latest Ref Rng & Units 10/18/2020 09/27/2013  WBC 4.0 - 10.5 K/uL 7.5 8.7  Hemoglobin 12.0 - 15.0 g/dL 9.0(L) 8.8(L)  Hematocrit 36.0 - 46.0 % 30.2(L) 28.8(L)  Platelets 150 - 400 K/uL 480(H) 419(H)    CMP Latest Ref Rng & Units 10/18/2020 09/27/2013  Glucose 70 - 99 mg/dL 101(H) 93  BUN 6 - 20 mg/dL 7 8  Creatinine 0.44 - 1.00 mg/dL 0.76 0.69  Sodium 135 - 145 mmol/L 140 139  Potassium 3.5 - 5.1 mmol/L 3.6 3.5(L)  Chloride 98 - 111 mmol/L 108 101  CO2 22 - 32 mmol/L 25 22  Calcium 8.9 - 10.3 mg/dL 8.6(L) 8.9  Total Protein 6.5 - 8.1 g/dL 7.3 6.9  Total Bilirubin 0.3 - 1.2 mg/dL 0.5 0.3  Alkaline Phos 38 - 126 U/L 59 53  AST 15 - 41 U/L 14(L) 16  ALT 0 - 44 U/L 8 8     RADIOGRAPHIC STUDIES: I have personally reviewed the radiological images as listed and agreed with the findings in the report. US BREAST LTD UNI RIGHT INC AXILLA  Result Date: 10/06/2020 CLINICAL DATA:  45 year old patient complaining of a palpable mass in the 12:30 region of the right breast. EXAM: DIGITAL DIAGNOSTIC BILATERAL MAMMOGRAM WITH TOMOSYNTHESIS AND CAD; ULTRASOUND RIGHT BREAST LIMITED TECHNIQUE: Bilateral digital diagnostic mammography and breast tomosynthesis was performed. The images were evaluated with computer-aided detection.; Targeted ultrasound examination of the right breast was performed COMPARISON:  None. ACR Breast Density Category c: The breast tissue is heterogeneously dense, which may obscure small masses. FINDINGS: No suspicious mass or malignant type microcalcifications identified in the left breast. There is a spiculated mass with obscured borders in the upper inner quadrant of the right breast, middle depth. There are no malignant type microcalcifications. On physical exam, I palpate a discrete mass in the right breast  at 12:30 3 cm from the nipple. Targeted ultrasound is performed, showing an irregular hypoechoic mass in the right breast at 12:30 3 cm from the nipple measuring 2.5 x 2.1 x 3.0 cm. Directly inferior and probably contiguous at 12:30 2 cm from the nipple is a hypoechoic heterogeneous mass measuring 3.0 x 1.6 x 2.8 cm. Sonographic evaluation of the right axilla does not show any enlarged adenopathy. IMPRESSION: Two contiguous hypoechoic masses in the right breast at 12:30 3 cm from the nipple and at 12:30 2 cm from the nipple. RECOMMENDATION: Ultrasound-guided core biopsy of the masses in the right breast is recommended. I have discussed the findings and recommendations with the patient. If applicable, a reminder  letter will be sent to the patient regarding the next appointment. BI-RADS CATEGORY  5: Highly suggestive of malignancy. Electronically Signed   By: Lillia Mountain M.D.   On: 10/06/2020 13:07  MM DIAG BREAST TOMO BILATERAL  Result Date: 10/06/2020 CLINICAL DATA:  45 year old patient complaining of a palpable mass in the 12:30 region of the right breast. EXAM: DIGITAL DIAGNOSTIC BILATERAL MAMMOGRAM WITH TOMOSYNTHESIS AND CAD; ULTRASOUND RIGHT BREAST LIMITED TECHNIQUE: Bilateral digital diagnostic mammography and breast tomosynthesis was performed. The images were evaluated with computer-aided detection.; Targeted ultrasound examination of the right breast was performed COMPARISON:  None. ACR Breast Density Category c: The breast tissue is heterogeneously dense, which may obscure small masses. FINDINGS: No suspicious mass or malignant type microcalcifications identified in the left breast. There is a spiculated mass with obscured borders in the upper inner quadrant of the right breast, middle depth. There are no malignant type microcalcifications. On physical exam, I palpate a discrete mass in the right breast at 12:30 3 cm from the nipple. Targeted ultrasound is performed, showing an irregular hypoechoic mass  in the right breast at 12:30 3 cm from the nipple measuring 2.5 x 2.1 x 3.0 cm. Directly inferior and probably contiguous at 12:30 2 cm from the nipple is a hypoechoic heterogeneous mass measuring 3.0 x 1.6 x 2.8 cm. Sonographic evaluation of the right axilla does not show any enlarged adenopathy. IMPRESSION: Two contiguous hypoechoic masses in the right breast at 12:30 3 cm from the nipple and at 12:30 2 cm from the nipple. RECOMMENDATION: Ultrasound-guided core biopsy of the masses in the right breast is recommended. I have discussed the findings and recommendations with the patient. If applicable, a reminder letter will be sent to the patient regarding the next appointment. BI-RADS CATEGORY  5: Highly suggestive of malignancy. Electronically Signed   By: Lillia Mountain M.D.   On: 10/06/2020 13:07  MM CLIP PLACEMENT RIGHT  Result Date: 10/06/2020 CLINICAL DATA:  45 year old female status post ultrasound-guided biopsy of the right breast. EXAM: 3D DIAGNOSTIC RIGHT MAMMOGRAM POST ULTRASOUND BIOPSY COMPARISON:  Previous exam(s). FINDINGS: 3D Mammographic images were obtained following ultrasound guided biopsy of the upper right breast. The biopsy marking clip is in expected position at the site of biopsy. IMPRESSION: Appropriate positioning of the ribbon shaped biopsy marking clip at the site of biopsy in the upper right breast. Final Assessment: Post Procedure Mammograms for Marker Placement Electronically Signed   By: Kristopher Oppenheim M.D.   On: 10/06/2020 13:51  [place] RT BREAST BX W LOC DEV 1ST LESION IMG BX SPEC US GUIDE  Addendum Date: 10/16/2020   ADDENDUM REPORT: 10/09/2020 14:58 ADDENDUM: Pathology revealed GRADE II INVASIVE MAMMARY CARCINOMA WITH LOBULAR FEATURES of the Right breast, 12:30 o'clock, 3 cmfn. This was found to be concordant by Dr. Kristopher Oppenheim. Pathology results were discussed with the patient by telephone. The patient reported doing well after the biopsy with tenderness at the site. Post  biopsy instructions and care were reviewed and questions were answered. The patient was encouraged to call [facility] for any additional concerns. My direct phone number was provided. The patient was referred to [facility] on October 18, 2020. Recommendation for a bilateral breast MRI to exclude any additional sites of disease given heterogeneously dense breast tissue and age. Pathology results reported by Terie Purser, RN on 10/09/2020. Electronically Signed   By: Kristopher Oppenheim M.D.   On:  10/09/2020 14:58   Result Date: 10/16/2020 CLINICAL DATA:  45 year old female with a highly suspicious palpable right breast mass. EXAM: ULTRASOUND GUIDED RIGHT BREAST CORE NEEDLE BIOPSY COMPARISON:  Previous exam(s). PROCEDURE: I met with the patient and we discussed the procedure of ultrasound-guided biopsy, including benefits and alternatives. We discussed the high likelihood of a successful procedure. We discussed the risks of the procedure, including infection, bleeding, tissue injury, clip migration, and inadequate sampling. Informed written consent was given. The usual time-out protocol was performed immediately prior to the procedure. Lesion quadrant: Upper outer quadrant At the time of preprocedural scanning, a vague mass with ill-defined borders at the 12:30 position 3 cm from the nipple was identified and targeted. A definite, separate mass could not be reproduced upon real-time scanning. Therefore, a single area biopsy was performed. Using sterile technique and 1% Lidocaine as local anesthetic, under direct ultrasound visualization, a 14 gauge spring-loaded device was used to perform biopsy of the right breast at the 12:30 position using a lateral approach. At the conclusion of the procedure a ribbon shaped tissue marker clip was deployed into the biopsy cavity. Follow up 2 view mammogram was performed and dictated  separately. IMPRESSION: Ultrasound guided biopsy of the right breast. No apparent complications. Electronically Signed: By: Kristopher Oppenheim M.D. On: 10/06/2020 13:41   ASSESSMENT & PLAN:  Connie Underwood is a 45 y.o. Pre-menopausal female with past medical history of migraine, otherwise healthy, presents with a palpable right breast mass  1.  Malignant neoplasm of upper-inner quadrant of right breast, invasive Ductal Carcinoma, pT2N0M0, Stage IB, ER+/PR+/HER2-, Grade 2  -she presented with a palpable right breast mass. Biopsy on 10/06/20 revealed IDC with lobular features, grade 2, ER+, PR+, Her2-. ---We discussed her imaging findings and the biopsy results in great details. -he was seen by Dr. Donne Hazel today and likely will proceed with surgery soon.  Given the size of the tumor, she likely needs mastectomy. Plan to obtain bilateral breast MRI for further evaluation, to see if she is a candidate for nipple sparing mastectomy.  -I recommend a Oncotype Dx test on the surgical sample and we'll make a decision about adjuvant chemotherapy based on the Oncotype result. Written material of this test was given to her. She is young and fit, would be a good candidate for chemotherapy if her Oncotype recurrence score is high. -If her surgical sentinel lymph node node positive, I recommend mammaprint for further risk stratification and guide adjuvant chemotherapy. -Given her strongly ER and PR positive disease, low-grade, low Ki-67, this is likely low risk disease. ---Giving her strongly ER and PR positivity of the tumor cells, and her pre-menopause status, I recommend adjuvant endocrine therapy with tamoxifen. The potential side effects, which includes but not limited to, hot flash, skin and vaginal dryness, slightly increased risk of cardiovascular disease and cataract, small risk of thrombosis and endometrial cancer, were discussed with her in great details. Preventive strategies for thrombosis, such as being  physically active, using compression stocks, avoid cigarette smoking, etc., were reviewed with her. I also recommend her to follow-up with her gynecologist once a year, and watch for vaginal spotting or bleeding, as a clinically sign of endometrial cancer, etc. She voiced good understanding, and agrees to proceed. Will start after she completes adjuvant breast radiation. -Alternative adjuvant endocrine therapy, such as aromatase inhibitor and overexpression were discussed with her also, given her young age. Plan to discuss more after her surgery  -We also reviewed the breast cancer surveillance,  including annual mammogram, and physical exam.  -Patient is interested in breast reconstruction.  -I recommend her to start tamoxifen while she is waiting for surgery, which may take some time to coordinate with her breast surgeon and plastic surgeon. She is agreeable  -She was also seen by radiation oncologist Dr. Sondra Come today, postmastectomy radiation will be determined after surgery, depends on her final surgical path.  2. Migraine  -on meds as needed   3. Anemia  -She has chronic anemia for many years, likely iron deficiency from menorrhagia -Her CBC showed hemoglobin 9.0 today, with low MCV 71.7  -I will check her iron study today, if ferritin significantly low, outside of her IV Venofer for her.  Benefit and potential side effects, especially allergy reactions, including anaphylactic reaction were discussed with her, she agrees to proceed.   Plan -I called in tamoxifen, she will start now, while she is waiting for surgery.  Hold tamoxifen 1 week before her surgery -Plan to proceed right breast mastectomy with reconstruction -Oncotype on her surgical sample, or MammaPrint if positive lymph node -I will see her after surgery or radiation. -I will set up iv venofer for her  -She will see genetic counselor today   Orders Placed This Encounter  Procedures   Ferritin    Standing Status:   Future     Number of Occurrences:   1    Standing Expiration Date:   10/18/2021     All questions were answered. The patient knows to call the clinic with any problems, questions or concerns. The total time spent in the appointment was 60 minutes.     Truitt Merle, MD 10/18/2020   I, Wilburn Mylar, am acting as scribe for Truitt Merle, MD.   I have reviewed the above documentation for accuracy and completeness, and I agree with the above.

## 2020-10-18 ENCOUNTER — Other Ambulatory Visit: Payer: Self-pay | Admitting: *Deleted

## 2020-10-18 ENCOUNTER — Encounter: Payer: Self-pay | Admitting: Genetic Counselor

## 2020-10-18 ENCOUNTER — Encounter: Payer: Self-pay | Admitting: Hematology

## 2020-10-18 ENCOUNTER — Ambulatory Visit: Payer: BC Managed Care – PPO | Admitting: Genetic Counselor

## 2020-10-18 ENCOUNTER — Inpatient Hospital Stay: Payer: BC Managed Care – PPO

## 2020-10-18 ENCOUNTER — Other Ambulatory Visit: Payer: Self-pay

## 2020-10-18 ENCOUNTER — Inpatient Hospital Stay: Payer: BC Managed Care – PPO | Attending: Hematology | Admitting: Hematology

## 2020-10-18 ENCOUNTER — Inpatient Hospital Stay: Payer: BC Managed Care – PPO | Admitting: Licensed Clinical Social Worker

## 2020-10-18 ENCOUNTER — Encounter: Payer: Self-pay | Admitting: *Deleted

## 2020-10-18 ENCOUNTER — Ambulatory Visit (INDEPENDENT_AMBULATORY_CARE_PROVIDER_SITE_OTHER): Payer: BC Managed Care – PPO | Admitting: Plastic Surgery

## 2020-10-18 ENCOUNTER — Ambulatory Visit
Admission: RE | Admit: 2020-10-18 | Discharge: 2020-10-18 | Disposition: A | Discharge status: Home / Self Care | Payer: BC Managed Care – PPO | Source: Ambulatory Visit | Attending: Radiation Oncology | Admitting: Radiation Oncology

## 2020-10-18 VITALS — BP 123/68 | HR 74 | Temp 97.7°F | Resp 18 | Ht 64.0 in | Wt 242.0 lb

## 2020-10-18 DIAGNOSIS — C50211 Malignant neoplasm of upper-inner quadrant of right female breast: Secondary | ICD-10-CM | POA: Diagnosis not present

## 2020-10-18 DIAGNOSIS — Z8042 Family history of malignant neoplasm of prostate: Secondary | ICD-10-CM

## 2020-10-18 DIAGNOSIS — Z170 Estrogen receptor positive status [ER+]: Secondary | ICD-10-CM

## 2020-10-18 DIAGNOSIS — D649 Anemia, unspecified: Secondary | ICD-10-CM | POA: Diagnosis not present

## 2020-10-18 DIAGNOSIS — G43909 Migraine, unspecified, not intractable, without status migrainosus: Secondary | ICD-10-CM | POA: Insufficient documentation

## 2020-10-18 DIAGNOSIS — D500 Iron deficiency anemia secondary to blood loss (chronic): Secondary | ICD-10-CM | POA: Insufficient documentation

## 2020-10-18 HISTORY — DX: Family history of malignant neoplasm of prostate: Z80.42

## 2020-10-18 LAB — CBC WITH DIFFERENTIAL (CANCER CENTER ONLY)
Abs Immature Granulocytes: 0.01 10*3/uL (ref 0.00–0.07)
Basophils Absolute: 0.1 10*3/uL (ref 0.0–0.1)
Basophils Relative: 1 %
Eosinophils Absolute: 0.2 10*3/uL (ref 0.0–0.5)
Eosinophils Relative: 3 %
HCT: 30.2 % — ABNORMAL LOW (ref 36.0–46.0)
Hemoglobin: 9 g/dL — ABNORMAL LOW (ref 12.0–15.0)
Immature Granulocytes: 0 %
Lymphocytes Relative: 27 %
Lymphs Abs: 2 10*3/uL (ref 0.7–4.0)
MCH: 21.4 pg — ABNORMAL LOW (ref 26.0–34.0)
MCHC: 29.8 g/dL — ABNORMAL LOW (ref 30.0–36.0)
MCV: 71.7 fL — ABNORMAL LOW (ref 80.0–100.0)
Monocytes Absolute: 0.5 10*3/uL (ref 0.1–1.0)
Monocytes Relative: 6 %
Neutro Abs: 4.7 10*3/uL (ref 1.7–7.7)
Neutrophils Relative %: 63 %
Platelet Count: 480 10*3/uL — ABNORMAL HIGH (ref 150–400)
RBC: 4.21 MIL/uL (ref 3.87–5.11)
RDW: 18.6 % — ABNORMAL HIGH (ref 11.5–15.5)
WBC Count: 7.5 10*3/uL (ref 4.0–10.5)
nRBC: 0 % (ref 0.0–0.2)

## 2020-10-18 LAB — CMP (CANCER CENTER ONLY)
ALT: 8 U/L (ref 0–44)
AST: 14 U/L — ABNORMAL LOW (ref 15–41)
Albumin: 3.5 g/dL (ref 3.5–5.0)
Alkaline Phosphatase: 59 U/L (ref 38–126)
Anion gap: 7 (ref 5–15)
BUN: 7 mg/dL (ref 6–20)
CO2: 25 mmol/L (ref 22–32)
Calcium: 8.6 mg/dL — ABNORMAL LOW (ref 8.9–10.3)
Chloride: 108 mmol/L (ref 98–111)
Creatinine: 0.76 mg/dL (ref 0.44–1.00)
GFR, Estimated: >60 mL/min (ref >60)
Glucose, Bld: 101 mg/dL — ABNORMAL HIGH (ref 70–99)
Potassium: 3.6 mmol/L (ref 3.5–5.1)
Sodium: 140 mmol/L (ref 135–145)
Total Bilirubin: 0.5 mg/dL (ref 0.3–1.2)
Total Protein: 7.3 g/dL (ref 6.5–8.1)

## 2020-10-18 LAB — GENETIC SCREENING ORDER

## 2020-10-18 LAB — FERRITIN: Ferritin: <4 ng/mL — ABNORMAL LOW (ref 11–307)

## 2020-10-18 MED ORDER — TAMOXIFEN CITRATE 20 MG PO TABS: 20.0000 mg | ORAL_TABLET | Freq: Every day | ORAL | 2 refills | Status: DC

## 2020-10-18 NOTE — Progress Notes (Signed)
Radiation Oncology         [phone] ________________________________  Multidisciplinary Breast Oncology Clinic [facility]) Initial Outpatient Consultation  Name: Connie Underwood MRN: [id number]  Date: 10/18/2020  DOB: 05-23-1975  FG:HWEXH, Beryle Lathe, FNP  Rolm Bookbinder, MD   REFERRING PHYSICIAN: Rolm Bookbinder, MD  DIAGNOSIS: The encounter diagnosis was Malignant neoplasm of upper-inner quadrant of right breast in female, estrogen receptor positive ([place]).  Stage IB (T2, Nx) Right Breast UOQ, Invasive Ductal Carcinoma, ER+ / PR+ / Her2-, Grade 2    ICD-10-CM   1. Malignant neoplasm of upper-inner quadrant of right breast in female, estrogen receptor positive ([place])  C50.211    Z17.0       HISTORY OF PRESENT ILLNESS::Connie Underwood is a 45 y.o. female who is presenting to the office today for evaluation of her newly diagnosed which  cancer. She is accompanied by her fiance. She is doing well overall.  She palpated a mass in the right breast which prompted imaging.  She underwent bilateral diagnostic mammography with tomography and right breast ultrasonography at [facility] on 10/06/20 showing: two contiguous hypoechoic masses in the right breast at 12:30, 3 cm from the nipple and at 12:30, 2 cm from the nipple.  Biopsy on 10/06/20 showed: grade 2 invasive mammary carcinoma with lobular features; measuring 0.9 cm in the greatest linear extent. Prognostic indicators significant for: estrogen receptor, 80% positive and progesterone receptor, 90% positive, both with both with strong staining intensity. Proliferation marker Ki67 at 10%. HER2 negative.  E-cadherin positive supporting ductal carcinoma  Menarche: 45 years old Age at first live birth: 45 years old GP: 4 LMP: 10/04/20 Contraceptive: None HRT: N/A   The patient was referred today for presentation in the multidisciplinary conference.  Radiology studies and pathology slides were presented there for review  and discussion of treatment options.  A consensus was discussed regarding potential next steps.  PREVIOUS RADIATION THERAPY: No  PAST MEDICAL HISTORY:  Past Medical History:  Diagnosis Date   Breast cancer ([place])    Family history of prostate cancer 10/18/2020   Hx of migraines    Seasonal allergic reaction     PAST SURGICAL HISTORY: Past Surgical History:  Procedure Laterality Date   TUBAL LIGATION      FAMILY HISTORY:  Family History  Problem Relation Age of Onset   Prostate cancer Maternal Uncle        dx after 1   Cancer Maternal Uncle        unknown type dx after 50; prostate? lung?   Prostate cancer Paternal Uncle        3 paternal uncles with prostate cancer dx after 52   Prostate cancer Paternal Grandfather        dx after 56    SOCIAL HISTORY:  Social History   Socioeconomic History   Marital status: Single    Spouse name: Not on file   Number of children: 4   Years of education: Not on file   Highest education level: Not on file  Occupational History   Not on file  Tobacco Use   Smoking status: Never   Smokeless tobacco: Never  Vaping Use   Vaping Use: Never used  Substance and Sexual Activity   Alcohol use: No   Drug use: No   Sexual activity: Not on file  Other Topics Concern   Not on file  Social History Narrative   Not on file   Social Determinants of Health  Financial Resource Strain: Medium Risk   Difficulty of Paying Living Expenses: Somewhat hard  Food Insecurity: Not on file  Transportation Needs: Unmet Transportation Needs   Lack of Transportation (Medical): Yes   Lack of Transportation (Non-Medical): No  Physical Activity: Not on file  Stress: Not on file  Social Connections: Not on file    ALLERGIES:  Allergies  Allergen Reactions   Tomato     Hives   Pollen Extract Other (See Comments)    Seasonal allergies     MEDICATIONS:  Current Outpatient Medications  Medication Sig Dispense Refill   EMGALITY 120 MG/ML SOAJ  Inject 1 mL into the skin every 30 (thirty) days.     escitalopram (LEXAPRO) 10 MG tablet Take 10 mg by mouth daily.     SUMAtriptan (IMITREX) 50 MG tablet      tamoxifen (NOLVADEX) 20 MG tablet Take 1 tablet (20 mg total) by mouth daily. 30 tablet 2   topiramate (TOPAMAX) 25 MG tablet      Vitamin D, Ergocalciferol, (DRISDOL) 1.25 MG (50000 UNIT) CAPS capsule Take 50,000 Units by mouth once a week.     No current facility-administered medications for this encounter.    REVIEW OF SYSTEMS: A 10+ POINT REVIEW OF SYSTEMS WAS OBTAINED including neurology, dermatology, psychiatry, cardiac, respiratory, lymph, extremities, GI, GU, musculoskeletal, constitutional, reproductive, HEENT. On the provided form, she reports wearing glasses, sinus problems, dentures, palpable breast lump, bruising easy, back pain, headaches, anxiety, depression, and anemia. She denies any other symptoms.    PHYSICAL EXAM:   Vitals with BMI 10/18/2020  Height _0   Weight 242 lbs  BMI 77.41  Systolic 287  Diastolic 68  Pulse 74   Lungs are clear to auscultation bilaterally. Heart has regular rate and rhythm. No palpable cervical, supraclavicular, or axillary adenopathy. Abdomen soft, non-tender, normal bowel sounds. Breast: Left breast with no palpable mass, nipple discharge, or bleeding. Right breast with palpable mass in the upper aspect of the breast estimated to be 3 and half by 4 cm, with no nipple discharge or bleeding.  KPS = 90  100 - Normal; no complaints; no evidence of disease. 90   - Able to carry on normal activity; minor signs or symptoms of disease. 80   - Normal activity with effort; some signs or symptoms of disease. 50   - Cares for self; unable to carry on normal activity or to do active work. 60   - Requires occasional assistance, but is able to care for most of his personal needs. 50   - Requires considerable assistance and frequent medical care. 75   - Disabled; requires special care and  assistance. 30   - Severely disabled; hospital admission is indicated although death not imminent. 64   - Very sick; hospital admission necessary; active supportive treatment necessary. 10   - Moribund; fatal processes progressing rapidly. 0     - Dead  Karnofsky DA, Abelmann [place], Craver LS and Burchenal [facility] [phone]) The use of the nitrogen mustards in the palliative treatment of carcinoma: with particular reference to bronchogenic carcinoma Cancer 1 [phone]  LABORATORY DATA:  Lab Results  Component Value Date   WBC 7.5 10/18/2020   HGB 9.0 (L) 10/18/2020   HCT 30.2 (L) 10/18/2020   MCV 71.7 (L) 10/18/2020   PLT 480 (H) 10/18/2020   Lab Results  Component Value Date   NA 140 10/18/2020   K 3.6 10/18/2020   CL 108 10/18/2020   CO2 25 10/18/2020  Lab Results  Component Value Date   ALT 8 10/18/2020   AST 14 (L) 10/18/2020   ALKPHOS 59 10/18/2020   BILITOT 0.5 10/18/2020    PULMONARY FUNCTION TEST:   Recent Review Flowsheet Data   There is no flowsheet data to display.     RADIOGRAPHY: US BREAST LTD UNI RIGHT INC AXILLA  Result Date: 10/06/2020 CLINICAL DATA:  45 year old patient complaining of a palpable mass in the 12:30 region of the right breast. EXAM: DIGITAL DIAGNOSTIC BILATERAL MAMMOGRAM WITH TOMOSYNTHESIS AND CAD; ULTRASOUND RIGHT BREAST LIMITED TECHNIQUE: Bilateral digital diagnostic mammography and breast tomosynthesis was performed. The images were evaluated with computer-aided detection.; Targeted ultrasound examination of the right breast was performed COMPARISON:  None. ACR Breast Density Category c: The breast tissue is heterogeneously dense, which may obscure small masses. FINDINGS: No suspicious mass or malignant type microcalcifications identified in the left breast. There is a spiculated mass with obscured borders in the upper inner quadrant of the right breast, middle depth. There are no malignant type microcalcifications. On physical exam, I palpate a discrete  mass in the right breast at 12:30 3 cm from the nipple. Targeted ultrasound is performed, showing an irregular hypoechoic mass in the right breast at 12:30 3 cm from the nipple measuring 2.5 x 2.1 x 3.0 cm. Directly inferior and probably contiguous at 12:30 2 cm from the nipple is a hypoechoic heterogeneous mass measuring 3.0 x 1.6 x 2.8 cm. Sonographic evaluation of the right axilla does not show any enlarged adenopathy. IMPRESSION: Two contiguous hypoechoic masses in the right breast at 12:30 3 cm from the nipple and at 12:30 2 cm from the nipple. RECOMMENDATION: Ultrasound-guided core biopsy of the masses in the right breast is recommended. I have discussed the findings and recommendations with the patient. If applicable, a reminder letter will be sent to the patient regarding the next appointment. BI-RADS CATEGORY  5: Highly suggestive of malignancy. Electronically Signed   By: Lillia Mountain M.D.   On: 10/06/2020 13:07  MM DIAG BREAST TOMO BILATERAL  Result Date: 10/06/2020 CLINICAL DATA:  45 year old patient complaining of a palpable mass in the 12:30 region of the right breast. EXAM: DIGITAL DIAGNOSTIC BILATERAL MAMMOGRAM WITH TOMOSYNTHESIS AND CAD; ULTRASOUND RIGHT BREAST LIMITED TECHNIQUE: Bilateral digital diagnostic mammography and breast tomosynthesis was performed. The images were evaluated with computer-aided detection.; Targeted ultrasound examination of the right breast was performed COMPARISON:  None. ACR Breast Density Category c: The breast tissue is heterogeneously dense, which may obscure small masses. FINDINGS: No suspicious mass or malignant type microcalcifications identified in the left breast. There is a spiculated mass with obscured borders in the upper inner quadrant of the right breast, middle depth. There are no malignant type microcalcifications. On physical exam, I palpate a discrete mass in the right breast at 12:30 3 cm from the nipple. Targeted ultrasound is performed, showing an  irregular hypoechoic mass in the right breast at 12:30 3 cm from the nipple measuring 2.5 x 2.1 x 3.0 cm. Directly inferior and probably contiguous at 12:30 2 cm from the nipple is a hypoechoic heterogeneous mass measuring 3.0 x 1.6 x 2.8 cm. Sonographic evaluation of the right axilla does not show any enlarged adenopathy. IMPRESSION: Two contiguous hypoechoic masses in the right breast at 12:30 3 cm from the nipple and at 12:30 2 cm from the nipple. RECOMMENDATION: Ultrasound-guided core biopsy of the masses in the right breast is recommended. I have discussed the findings and recommendations with the patient. If  applicable, a reminder letter will be sent to the patient regarding the next appointment. BI-RADS CATEGORY  5: Highly suggestive of malignancy. Electronically Signed   By: Lillia Mountain M.D.   On: 10/06/2020 13:07  MM CLIP PLACEMENT RIGHT  Result Date: 10/06/2020 CLINICAL DATA:  45 year old female status post ultrasound-guided biopsy of the right breast. EXAM: 3D DIAGNOSTIC RIGHT MAMMOGRAM POST ULTRASOUND BIOPSY COMPARISON:  Previous exam(s). FINDINGS: 3D Mammographic images were obtained following ultrasound guided biopsy of the upper right breast. The biopsy marking clip is in expected position at the site of biopsy. IMPRESSION: Appropriate positioning of the ribbon shaped biopsy marking clip at the site of biopsy in the upper right breast. Final Assessment: Post Procedure Mammograms for Marker Placement Electronically Signed   By: Kristopher Oppenheim M.D.   On: 10/06/2020 13:51  [place] RT BREAST BX W LOC DEV 1ST LESION IMG BX SPEC US GUIDE  Addendum Date: 10/16/2020   ADDENDUM REPORT: 10/09/2020 14:58 ADDENDUM: Pathology revealed GRADE II INVASIVE MAMMARY CARCINOMA WITH LOBULAR FEATURES of the Right breast, 12:30 o'clock, 3 cmfn. This was found to be concordant by Dr. Kristopher Oppenheim. Pathology results were discussed with the patient by telephone. The patient reported doing well after the biopsy with  tenderness at the site. Post biopsy instructions and care were reviewed and questions were answered. The patient was encouraged to call [facility] for any additional concerns. My direct phone number was provided. The patient was referred to [facility] on October 18, 2020. Recommendation for a bilateral breast MRI to exclude any additional sites of disease given heterogeneously dense breast tissue and age. Pathology results reported by Terie Purser, RN on 10/09/2020. Electronically Signed   By: Kristopher Oppenheim M.D.   On: 10/09/2020 14:58   Result Date: 10/16/2020 CLINICAL DATA:  45 year old female with a highly suspicious palpable right breast mass. EXAM: ULTRASOUND GUIDED RIGHT BREAST CORE NEEDLE BIOPSY COMPARISON:  Previous exam(s). PROCEDURE: I met with the patient and we discussed the procedure of ultrasound-guided biopsy, including benefits and alternatives. We discussed the high likelihood of a successful procedure. We discussed the risks of the procedure, including infection, bleeding, tissue injury, clip migration, and inadequate sampling. Informed written consent was given. The usual time-out protocol was performed immediately prior to the procedure. Lesion quadrant: Upper outer quadrant At the time of preprocedural scanning, a vague mass with ill-defined borders at the 12:30 position 3 cm from the nipple was identified and targeted. A definite, separate mass could not be reproduced upon real-time scanning. Therefore, a single area biopsy was performed. Using sterile technique and 1% Lidocaine as local anesthetic, under direct ultrasound visualization, a 14 gauge spring-loaded device was used to perform biopsy of the right breast at the 12:30 position using a lateral approach. At the conclusion of the procedure a ribbon shaped tissue marker clip was deployed into the biopsy cavity. Follow up 2 view  mammogram was performed and dictated separately. IMPRESSION: Ultrasound guided biopsy of the right breast. No apparent complications. Electronically Signed: By: Kristopher Oppenheim M.D. On: 10/06/2020 13:41     IMPRESSION: Stage IB (T2, Nx) Right Breast UOQ, Invasive Ductal Carcinoma, ER+ / PR+ / Her2-, Grade 2   Based on physical exam and mammographic findings she would not be a good candidate for breast conserving surgery particularly in light of her breast size. The patient will proceed with  mastectomy. She is interested in immediate reconstruction and  will see Dr. Claudia Desanctis for discussion of this issue.  Depending on the final pathology results she may benefit from post mastectomy radiation therapy.    PLAN:  Genetics MRI Right mastectomy with SLN Oncotype  Tamoxifen initiating in July  Radiation therapy may be recommended depending on final path.   ------------------------------------------------  Blair Promise, PhD, MD  This document serves as a record of services personally performed by Gery Pray, MD. It was created on his behalf by Roney Mans, a trained medical scribe. The creation of this record is based on the scribe's personal observations and the provider's statements to them. This document has been checked and approved by the attending provider.

## 2020-10-18 NOTE — Progress Notes (Signed)
Referring Provider Kristen Loader, [place],  [place] [address]   CC:  Chief Complaint  Patient presents with   Advice Only      Connie Underwood is an 45 y.o. female.  HPI: Patient presents to discuss options for breast reconstruction.  She recently found a right-sided breast mass has been diagnosed with right-sided breast cancer.  She is planning nipple sparing right sided unilateral mastectomy with Dr. Donne Hazel and would like to discuss reconstruction.  No previous breast biopsies or procedures.  Allergies  Allergen Reactions   Tomato     Hives   Pollen Extract Other (See Comments)    Seasonal allergies     Outpatient Encounter Medications as of 10/18/2020  Medication Sig   EMGALITY 120 MG/ML SOAJ Inject 1 mL into the skin every 30 (thirty) days.   escitalopram (LEXAPRO) 10 MG tablet Take 10 mg by mouth daily.   SUMAtriptan (IMITREX) 50 MG tablet    tamoxifen (NOLVADEX) 20 MG tablet Take 1 tablet (20 mg total) by mouth daily.   topiramate (TOPAMAX) 25 MG tablet    Vitamin D, Ergocalciferol, (DRISDOL) 1.25 MG (50000 UNIT) CAPS capsule Take 50,000 Units by mouth once a week.   No facility-administered encounter medications on file as of 10/18/2020.     Past Medical History:  Diagnosis Date   Breast cancer [facility])    Family history of prostate cancer 10/18/2020   Hx of migraines    Seasonal allergic reaction     Past Surgical History:  Procedure Laterality Date   TUBAL LIGATION      Family History  Problem Relation Age of Onset   Prostate cancer Maternal Uncle        dx after 70   Cancer Maternal Uncle        unknown type dx after 50; prostate? lung?   Prostate cancer Paternal Uncle        3 paternal uncles with prostate cancer dx after 15   Prostate cancer Paternal Grandfather        dx after 4    Social History   Social History Narrative   Not on file     Review of Systems General: Denies fevers, chills, weight loss CV: Denies  chest pain, shortness of breath, palpitations  Physical Exam Vitals with BMI 10/18/2020 03/06/2018 07/15/2017  Height 5\' 4"  - -  Weight 242 lbs - -  BMI 09.32 - -  Systolic [phone]  Diastolic 68 86 82  Pulse 74 82 87    General:  No acute distress,  Alert and oriented, Non-Toxic, Normal speech and affect Breast: She has minimal ptosis.  She is probably around a B or C cup.  No obvious scars.  Palpable right breast mass.  Base width 12.5 cm.  Assessment/Plan Patient is a good candidate for immediate reconstruction.  We discussed tissue expander reconstruction.  I explained I will place a tissue expander at the time of the mastectomy which would gradually be inflated until she reached the size that she would like and then switched out for a gel implant.  We discussed the risks of the operation that include bleeding, infection, damage to surrounding structures and need for additional procedures.  I discussed that this is a stage reconstruction and a second operation will be required.  Also discussed that further procedures to the left side could be done for shape size or symmetry improvements as indicated depending on the final shape on  the right side.  I explained the location and orientation of the scars and the need for drains postoperatively.  Patient is fully understanding and would like to move forward.  Cindra Presume 10/18/2020, 4:20 PM

## 2020-10-18 NOTE — Progress Notes (Signed)
REFERRING PROVIDER: Truitt Merle, MD [place],  [place] [id number]  PRIMARY PROVIDER:  Kristen Loader, FNP  PRIMARY REASON FOR VISIT:  Encounter Diagnoses  Name Primary?   Malignant neoplasm of upper-inner quadrant of right breast in female, estrogen receptor positive ([place]) Yes   Family history of prostate cancer     HISTORY OF PRESENT ILLNESS:   Connie Underwood, a 45 y.o. female, was seen for a [place] cancer genetics consultation during the breast multidisciplinary clinic at the request of Dr. Burr Medico due to a personal and family history of cancer.  Connie Underwood presents to clinic today to discuss the possibility of a hereditary predisposition to cancer, to discuss genetic testing, and to further clarify her future cancer risks, as well as potential cancer risks for family members.   In July 2022, at the age of 5, Connie Underwood was diagnosed with invasive ductal carcinoma of the right breast (ER+/PR+/HER2-). The preliminary treatment plan includes mastectomy, Oncotype to determine potential benefit of chemotherapy, and anti-estrogens.  CANCER HISTORY:  Oncology History  Malignant neoplasm of upper-inner quadrant of right breast in female, estrogen receptor positive ([place])  10/06/2020 Mammogram   DIGITAL DIAGNOSTIC BILATERAL MAMMOGRAM WITH TOMOSYNTHESIS AND CAD; ULTRASOUND RIGHT BREAST LIMITED  IMPRESSION: Two contiguous hypoechoic masses in the right breast- an irregular hypoechoic mass in the right breast at 12:30 3 cm from the nipple measuring 2.5 x 2.1 x 3.0 cm, and a hypoechoic heterogeneous mass at 12:30 2 cm from the nipple measuring 3.0 x 1.6 x 2.8 cm. Sonographic evaluation of the right axilla does not show any enlarged adenopathy.   10/06/2020 Pathology Results   Diagnosis Breast, right, needle core biopsy - INVASIVE MAMMARY CARCINOMA WITH LOBULAR FEATURES - SEE COMMENT Microscopic Comment The biopsy material shows an infiltrative proliferation of cells with  large vesicular nuclei with inconspicuous nucleoli, arranged linearly and in small clusters. Based on the biopsy, the carcinoma appears Nottingham grade 2 of 3 and measures 0.9 cm in greatest linear extent  E-cadherin is POSITIVE supporting a ductal origin.  PROGNOSTIC INDICATORS Results: IMMUNOHISTOCHEMICAL AND MORPHOMETRIC ANALYSIS PERFORMED MANUALLY The tumor cells are NEGATIVE for Her2 (1+). Estrogen Receptor: 80%, POSITIVE, STRONG STAINING INTENSITY Progesterone Receptor: 90%, POSITIVE, STRONG STAINING INTENSITY Proliferation Marker Ki67: 10%   10/06/2020 Cancer Staging   Staging form: Breast, AJCC 8th Edition - Clinical stage from 10/06/2020: Stage IB (cT2, cN0, cM0, G2, ER+, PR+, HER2-) - Signed by Truitt Merle, MD on 10/17/2020  Stage prefix: Initial diagnosis  Histologic grading system: 3 grade system    10/13/2020 Initial Diagnosis   Malignant neoplasm of upper-inner quadrant of right breast in female, estrogen receptor positive ([place])     RISK FACTORS:  Menarche was at age 60.  First live birth at age 60.  OCP use for approximately 0 years.  Ovaries intact: yes.  Hysterectomy: no.  Menopausal status: premenopausal.  HRT use: 0 years. Colonoscopy: no; not examined. Mammogram within the last year: yes. Up to date with pelvic exams: most recent PAP in 12/2018.  Past Medical History:  Diagnosis Date   Breast cancer ([place])    Hx of migraines    Seasonal allergic reaction     Past Surgical History:  Procedure Laterality Date   TUBAL LIGATION      Social History   Socioeconomic History   Marital status: Single    Spouse name: Not on file   Number of children: 4   Years of education: Not on file  Highest education level: Not on file  Occupational History   Not on file  Tobacco Use   Smoking status: Never   Smokeless tobacco: Never  Vaping Use   Vaping Use: Never used  Substance and Sexual Activity   Alcohol use: No   Drug use: No   Sexual activity: Not on  file  Other Topics Concern   Not on file  Social History Narrative   Not on file   Social Determinants of Health   Financial Resource Strain: Not on file  Food Insecurity: Not on file  Transportation Needs: Not on file  Physical Activity: Not on file  Stress: Not on file  Social Connections: Not on file     FAMILY HISTORY:  We obtained a detailed, 4-generation family history.  Significant diagnoses are listed below: Family History  Problem Relation Age of Onset   Prostate cancer Maternal Uncle        dx after 63   Cancer Maternal Uncle        unknown type dx after 50; prostate? lung?   Prostate cancer Paternal Uncle        3 paternal uncles with prostate cancer dx after 76   Prostate cancer Paternal Grandfather        dx after 64     Connie Underwood is unaware of previous family history of genetic testing for hereditary cancer risks besides that mentioned above.  She stated that her paternal female cousin may have had positive hereditary cancer genetic testing.  No report was available for review today; however, contact information was provided in the event that Ms. Bahner is able to access her cousin's genetic testing report. There is no reported Ashkenazi Jewish ancestry. There is no known consanguinity.  GENETIC COUNSELING ASSESSMENT: Connie Underwood is a 45 y.o. female with a personal history of cancer which is somewhat suggestive of a hereditary cancer syndrome and predisposition to cancer given her age of diagnosis. We, therefore, discussed and recommended the following at today's visit.   DISCUSSION: We discussed that 5 - 10% of cancer is hereditary, with most cases of hereditary breast cancer associated with mutations in BRCA1/2.  There are other genes that can be associated with hereditary breast and prostate cancer syndromes.  Type of cancer risk and level of risk are gene-specific. We discussed that testing is beneficial for several reasons including knowing how to follow  individuals after completing their treatment, identifying whether potential treatment options would be beneficial, and understanding if other family members could be at risk for cancer and allowing them to undergo genetic testing.   We reviewed the characteristics, features and inheritance patterns of hereditary cancer syndromes. We also discussed genetic testing, including the appropriate family members to test, the process of testing, insurance coverage and turn-around-time for results. We discussed the implications of a negative, positive and/or variant of uncertain significant result. In order to get genetic test results in a timely manner so that Connie Underwood can use these genetic test results for surgical decisions, we recommended Connie Underwood pursue genetic testing for the [facility] BRCAPlus STAT Panel.  The BRCAplus panel offered by [employer] and includes sequencing and deletion/duplication analysis for the following 8 genes: ATM, BRCA1, BRCA2, CDH1, CHEK2, PALB2, PTEN, and TP53. Once complete, we recommend Connie Underwood pursue reflex genetic testing to a more comprehensive gene panel.   Connie Underwood  was offered a common hereditary cancer panel (47 genes) and an expanded pan-cancer panel (77 genes). Connie Underwood was informed  of the benefits and limitations of each panel, including that expanded pan-cancer panels contain genes that do not have clear management guidelines at this point in time.  We also discussed that as the number of genes included on a panel increases, the chances of variants of uncertain significance increases.  After considering the benefits and limitations of each gene panel, Connie Underwood  opted to have an expanded Radio broadcast assistant through [place].  The CancerNext-Expanded gene panel offered by [facility] and includes sequencing, rearrangement, and RNA analysis for the following 77 genes: AIP, ALK, APC, ATM, AXIN2, BAP1, BARD1, BLM, BMPR1A, BRCA1, BRCA2, BRIP1, CDC73, CDH1, CDK4,  CDKN1B, CDKN2A, CHEK2, CTNNA1, DICER1, FANCC, FH, FLCN, GALNT12, KIF1B, LZTR1, MAX, MEN1, MET, MLH1, MSH2, MSH3, MSH6, MUTYH, NBN, NF1, NF2, NTHL1, PALB2, PHOX2B, PMS2, POT1, PRKAR1A, PTCH1, PTEN, RAD51C, RAD51D, RB1, RECQL, RET, SDHA, SDHAF2, SDHB, SDHC, SDHD, SMAD4, SMARCA4, SMARCB1, SMARCE1, STK11, SUFU, TMEM127, TP53, TSC1, TSC2, VHL and XRCC2 (sequencing and deletion/duplication); EGFR, EGLN1, HOXB13, KIT, MITF, PDGFRA, POLD1, and POLE (sequencing only); EPCAM and GREM1 (deletion/duplication only).   Based on Connie Underwood's personal history of cancer, she meets medical criteria for genetic testing. Despite that she meets criteria, she may still have an out of pocket cost. We discussed that if her out of pocket cost for testing is over $100, the laboratory should contact her to discuss self-pay prices, patient pay assistance programs, if applicable, and other billing options.   PLAN: After considering the risks, benefits, and limitations, Connie Underwood provided informed consent to pursue genetic testing and the blood sample was sent to [facility] for analysis of the [place] +RNAinsight Panel. Results should be available within approximately 1-2 weeks' time, at which point they will be disclosed by telephone to Connie Underwood, as will any additional recommendations warranted by these results. Connie Underwood will receive a summary of her genetic counseling visit and a copy of her results once available. This information will also be available in Epic.   Lastly, we encouraged Connie Underwood to remain in contact with cancer genetics annually so that we can continuously update the family history and inform her of any changes in cancer genetics and testing that may be of benefit for this family.   Connie Underwood questions were answered to her satisfaction today. Our contact information was provided should additional questions or concerns arise. Thank you for the referral and allowing [place] to  share in the care of your patient.   Connie Underwood M. Joette Catching, [place], [facility] Genetic Counselor [email or web address] (P) [phone]  The patient was seen for a total of 20 minutes in face-to-face genetic counseling.  The patient brought her partner to her appointment.  Drs. Magrinat, Lindi Adie and/or Burr Medico were available to discuss this case as needed.  _______________________________________________________________________ For Office Staff:  Number of people involved in session: 2 Was an Intern/ student involved with case: no

## 2020-10-18 NOTE — Progress Notes (Signed)
[place] Work  Initial Assessment   Connie Underwood is a 45 y.o. year old female accompanied by partner. Clinical Social Work was referred by [facility] for assessment of psychosocial needs.   SDOH (Social Determinants of Health) assessments performed: Yes SDOH Interventions    Flowsheet Row Most Recent Value  SDOH Interventions   Financial Strain Interventions Other (Comment)  [cancer foundations]  Housing Interventions Other (Comment)  [Breast cancer foundations]  Transportation Interventions [facility]       Distress Screen completed: Yes ONCBCN DISTRESS SCREENING 10/18/2020  Screening Type Initial Screening  Practical problem type Housing;Transportation  Emotional problem type Depression;Nervousness/Anxiety;Adjusting to illness      Family/Social Information:  Housing Arrangement: patient lives with two youngest children (Connie Underwood age 60, Connie Underwood age 18). Significant other spends time at the home . Older two children live in [place] Family members/support persons in your life? Family (sig other, children, cousin), coworkers Transportation concerns: yes, car recently broke down. Kids & sig other helping but might need assistance for some appts  Employment: Working full time at [employer] center. Income source: Employment. Has been there for 4 months, not sure if she has short-term disability Financial concerns: Yes, current concerns Type of concern: Utilities, Rent/ mortgage, and Transportation Food access concerns: no Services Currently in place:  establishing with new therapist in August  Coping/ Adjustment to diagnosis: Patient understands treatment plan and what happens next? Yes. Approaching treatment as taking it as it comes so she can heal and move forward Concerns about diagnosis and/or treatment: How I will pay for the services I need and General adjustment to diagnosis Patient reported stressors: Housing, Transportation,  Depression, Anxiety, and Adjusting to my illness Hopes and priorities: complete treatment and be cancer free Patient enjoys time with family/ friends Current coping skills/ strengths: Ability for insight, Capable of independent living, Motivation for treatment/growth, and Supportive family/friends    SUMMARY: Current SDOH Barriers:  Financial constraints related to any change in work hours due to treatment, Transportation, and [place] Work Clinical Goal(s):  patient will work with SW to address concerns related to transportation and financial needs Patient will keep her appointment with new therapist in August  Interventions: Discussed common feeling and emotions when being diagnosed with cancer, and the importance of support during treatment Informed patient of the support team roles and support services at [facility] Provided CSW contact information and encouraged patient to call with any questions or concerns Referred patient to [facility] transportation services E-mailed information on breast cancer foundations for financial assistance   Follow Up Plan: Patient will contact CSW with any support or resource needs and/or for assistance submitting applications for assistance Patient verbalizes understanding of plan: Yes    Connie Douglas LCSW

## 2020-10-19 ENCOUNTER — Telehealth: Payer: Self-pay | Admitting: General Practice

## 2020-10-19 NOTE — Telephone Encounter (Signed)
[place] CSW Progress Notes  Call to patient to introduce CSWs Dalene Seltzer and Sharon who cover Dr Ernestina Penna patients.  Sent her information on [employer] copy in [place] and [employer]. At this point, she is planning on working as much as she is able during treatment.  She is also investigating short term disability options with her current employer.  She has our contact information and will call with any questions/concerns.   Edwyna Shell, LCSW Clinical Social Worker Phone:  [phone]

## 2020-10-21 ENCOUNTER — Ambulatory Visit (HOSPITAL_COMMUNITY)
Admission: RE | Admit: 2020-10-21 | Discharge: 2020-10-21 | Disposition: A | Discharge status: Home / Self Care | Payer: BC Managed Care – PPO | Source: Ambulatory Visit | Attending: Urgent Care | Admitting: Urgent Care

## 2020-10-21 ENCOUNTER — Encounter (HOSPITAL_COMMUNITY): Payer: Self-pay

## 2020-10-21 ENCOUNTER — Other Ambulatory Visit: Payer: Self-pay

## 2020-10-21 VITALS — BP 130/73 | HR 86 | Temp 98.8°F | Resp 18

## 2020-10-21 DIAGNOSIS — M62830 Muscle spasm of back: Secondary | ICD-10-CM | POA: Diagnosis not present

## 2020-10-21 DIAGNOSIS — R059 Cough, unspecified: Secondary | ICD-10-CM

## 2020-10-21 DIAGNOSIS — J3489 Other specified disorders of nose and nasal sinuses: Secondary | ICD-10-CM

## 2020-10-21 DIAGNOSIS — U071 COVID-19: Secondary | ICD-10-CM | POA: Diagnosis not present

## 2020-10-21 DIAGNOSIS — J3089 Other allergic rhinitis: Secondary | ICD-10-CM | POA: Diagnosis not present

## 2020-10-21 DIAGNOSIS — M549 Dorsalgia, unspecified: Secondary | ICD-10-CM | POA: Diagnosis not present

## 2020-10-21 MED ORDER — FLUTICASONE PROPIONATE 50 MCG/ACT NA SUSP: 2.0000 | Freq: Every day | NASAL | 12 refills | Status: DC

## 2020-10-21 MED ORDER — PROMETHAZINE-DM 6.25-15 MG/5ML PO SYRP: 5.0000 mL | ORAL_SOLUTION | Freq: Every evening | ORAL | 0 refills | Status: DC | PRN

## 2020-10-21 MED ORDER — NAPROXEN 500 MG PO TABS: 500.0000 mg | ORAL_TABLET | Freq: Two times a day (BID) | ORAL | 0 refills | Status: DC

## 2020-10-21 MED ORDER — TIZANIDINE HCL 4 MG PO TABS: 4.0000 mg | ORAL_TABLET | Freq: Every day | ORAL | 0 refills | Status: DC

## 2020-10-21 MED ORDER — PSEUDOEPHEDRINE HCL 60 MG PO TABS: 60.0000 mg | ORAL_TABLET | Freq: Three times a day (TID) | ORAL | 0 refills | Status: DC | PRN

## 2020-10-21 MED ORDER — BENZONATATE 100 MG PO CAPS: 100.0000 mg | ORAL_CAPSULE | Freq: Three times a day (TID) | ORAL | 0 refills | Status: DC | PRN

## 2020-10-21 MED ORDER — CETIRIZINE HCL 10 MG PO TABS: 10.0000 mg | ORAL_TABLET | Freq: Every day | ORAL | 0 refills | Status: DC

## 2020-10-21 NOTE — ED Triage Notes (Signed)
Pt presents with right shoulder pain xs 1 week. States started out as neck pain and radiated to shoulder.  Also c/o of sinus pain and pressure.

## 2020-10-21 NOTE — ED Provider Notes (Signed)
[facility]   MRN: [id number] DOB: Jul 02, 1975  Subjective:   Connie Underwood is a 45 y.o. female presenting for 1 week history of persistent right upper back pain, trapezius pain and tightness.  Has not used medications for this.  She is also had a runny and stuffy nose, scratchy throat and cough.  Has persistent trouble with her sinuses especially with weather changes.  Denies chest pain, shortness of breath.  No COVID vaccination.  No smoking history.  Patient does not take any medications for allergies but has been diagnosed with this in the past.  Regarding her back pain, does not endorse severe pain but does feel very uncomfortable.  Denies fall, trauma, radicular symptoms, weakness, numbness or tingling.  No current facility-administered medications for this encounter.  Current Outpatient Medications:    EMGALITY 120 MG/ML SOAJ, Inject 1 mL into the skin every 30 (thirty) days., Disp: , Rfl:    escitalopram (LEXAPRO) 10 MG tablet, Take 10 mg by mouth daily., Disp: , Rfl:    SUMAtriptan (IMITREX) 50 MG tablet, , Disp: , Rfl:    tamoxifen (NOLVADEX) 20 MG tablet, Take 1 tablet (20 mg total) by mouth daily., Disp: 30 tablet, Rfl: 2   topiramate (TOPAMAX) 25 MG tablet, , Disp: , Rfl:    Vitamin D, Ergocalciferol, (DRISDOL) 1.25 MG (50000 UNIT) CAPS capsule, Take 50,000 Units by mouth once a week., Disp: , Rfl:    Allergies  Allergen Reactions   Tomato     Hives   Pollen Extract Other (See Comments)    Seasonal allergies     Past Medical History:  Diagnosis Date   Breast cancer ([place])    Family history of prostate cancer 10/18/2020   Hx of migraines    Seasonal allergic reaction      Past Surgical History:  Procedure Laterality Date   TUBAL LIGATION      Family History  Problem Relation Age of Onset   Prostate cancer Maternal Uncle        dx after 82   Cancer Maternal Uncle        unknown type dx after 50; prostate? lung?   Prostate cancer Paternal  Uncle        3 paternal uncles with prostate cancer dx after 25   Prostate cancer Paternal Grandfather        dx after 33    Social History   Tobacco Use   Smoking status: Never   Smokeless tobacco: Never  Vaping Use   Vaping Use: Never used  Substance Use Topics   Alcohol use: No   Drug use: No    ROS   Objective:   Vitals: BP 130/73 (BP Location: Left Arm)   Pulse 86   Temp 98.8 F (37.1 C) (Oral)   Resp 18   LMP 10/06/2020 (Exact Date)   SpO2 97%   Physical Exam Constitutional:      General: She is not in acute distress.    Appearance: Normal appearance. She is well-developed. She is not ill-appearing, toxic-appearing or diaphoretic.  HENT:     Head: Normocephalic and atraumatic.     Right Ear: Tympanic membrane and ear canal normal. No drainage or tenderness. No middle ear effusion. Tympanic membrane is not erythematous.     Left Ear: Tympanic membrane and ear canal normal. No drainage or tenderness.  No middle ear effusion. Tympanic membrane is not erythematous.     Nose: Congestion and rhinorrhea present.  Mouth/Throat:     Mouth: Mucous membranes are moist. No oral lesions.     Pharynx: No pharyngeal swelling, oropharyngeal exudate, posterior oropharyngeal erythema or uvula swelling.     Tonsils: No tonsillar exudate or tonsillar abscesses.  Eyes:     General: No scleral icterus.       Right eye: No discharge.        Left eye: No discharge.     Extraocular Movements: Extraocular movements intact.     Right eye: Normal extraocular motion.     Left eye: Normal extraocular motion.     Conjunctiva/sclera: Conjunctivae normal.     Pupils: Pupils are equal, round, and reactive to light.  Cardiovascular:     Rate and Rhythm: Normal rate and regular rhythm.     Pulses: Normal pulses.     Heart sounds: Normal heart sounds. No murmur heard.   No friction rub. No gallop.  Pulmonary:     Effort: Pulmonary effort is normal. No respiratory distress.     Breath  sounds: Normal breath sounds. No stridor. No wheezing, rhonchi or rales.  Musculoskeletal:     Cervical back: Normal range of motion and neck supple.       Back:  Lymphadenopathy:     Cervical: No cervical adenopathy.  Skin:    General: Skin is warm and dry.     Findings: No rash.  Neurological:     General: No focal deficit present.     Mental Status: She is alert and oriented to person, place, and time.  Psychiatric:        Mood and Affect: Mood normal.        Behavior: Behavior normal.        Thought Content: Thought content normal.        Judgment: Judgment normal.    Assessment and Plan :   PDMP not reviewed this encounter.  1. Muscle spasm of back   2. Upper back pain   3. Allergic rhinitis due to other allergic trigger, unspecified seasonality   4. Stuffy and runny nose   5. Cough     Will manage conservatively for musculoskeletal pain and spasms with NSAID and muscle relaxant, rest and modification of physical activity.  Anticipatory guidance provided.  Suspect uncontrolled allergic rhinitis and therefore recommended triple therapy for this.  COVID-19 testing pending.  Counseled patient on potential for adverse effects with medications prescribed/recommended today, ER and return-to-clinic precautions discussed, patient verbalized understanding.    Jaynee Eagles, PA-C 10/21/20 1758

## 2020-10-21 NOTE — Discharge Instructions (Addendum)

## 2020-10-22 LAB — SARS CORONAVIRUS 2 (TAT 6-24 HRS): SARS Coronavirus 2: POSITIVE — AB

## 2020-10-24 ENCOUNTER — Encounter: Payer: Self-pay | Admitting: *Deleted

## 2020-10-24 ENCOUNTER — Telehealth: Payer: Self-pay | Admitting: *Deleted

## 2020-10-24 NOTE — Telephone Encounter (Signed)
Attempted to call patient to follow up from [facility] 7/20. Was unable to leave voicemail due to it not being set up. Looks like her MRI that was scheduled for 7/27 has been r/s to 8/11 due to patient tested positive for COVID 7/23.

## 2020-10-25 ENCOUNTER — Other Ambulatory Visit: Payer: BC Managed Care – PPO

## 2020-10-25 ENCOUNTER — Encounter: Payer: Self-pay | Admitting: Genetic Counselor

## 2020-10-25 ENCOUNTER — Telehealth: Payer: Self-pay | Admitting: Genetic Counselor

## 2020-10-25 DIAGNOSIS — Z1379 Encounter for other screening for genetic and chromosomal anomalies: Secondary | ICD-10-CM | POA: Insufficient documentation

## 2020-10-25 NOTE — Telephone Encounter (Signed)
Contacted patient in attempt to disclose results of genetic testing.  No answer and VM not set up.

## 2020-10-26 ENCOUNTER — Telehealth: Payer: Self-pay | Admitting: *Deleted

## 2020-10-26 ENCOUNTER — Other Ambulatory Visit: Payer: BC Managed Care – PPO

## 2020-10-26 NOTE — Telephone Encounter (Signed)
Revealed negative genetic testing of STAT panel.  Discussed that we do not know why she has breast cancer. It could be sporadic/familial, due to a different gene that we are not testing, or maybe our current technology may not be able to pick something up.  It will be important for her to keep in contact with genetics to keep up with whether additional testing may be needed.  Asked questions about self-pay price for genetic testing instead of proceeding through insurance.  Encouraged her to call [facility] directly.  Pan-cancer panel is pending.

## 2020-10-26 NOTE — Telephone Encounter (Signed)
Pt called with questions regarding Tamoxifen.Provided education and instructions. Received verbal understanding. Denies further questions or concerns encourage pt to call with needs.

## 2020-10-27 ENCOUNTER — Telehealth: Payer: Self-pay | Admitting: *Deleted

## 2020-10-27 ENCOUNTER — Encounter: Payer: Self-pay | Admitting: *Deleted

## 2020-10-27 NOTE — Telephone Encounter (Signed)
Ordered oncoytpe (core) per Dr.Feng. Faxed requisition to [facility].

## 2020-11-03 ENCOUNTER — Other Ambulatory Visit: Payer: Self-pay

## 2020-11-03 ENCOUNTER — Inpatient Hospital Stay: Payer: BC Managed Care – PPO | Attending: Hematology

## 2020-11-03 VITALS — BP 115/65 | HR 80 | Temp 98.7°F | Resp 18

## 2020-11-03 DIAGNOSIS — G43909 Migraine, unspecified, not intractable, without status migrainosus: Secondary | ICD-10-CM | POA: Diagnosis not present

## 2020-11-03 DIAGNOSIS — N920 Excessive and frequent menstruation with regular cycle: Secondary | ICD-10-CM | POA: Insufficient documentation

## 2020-11-03 DIAGNOSIS — Z170 Estrogen receptor positive status [ER+]: Secondary | ICD-10-CM | POA: Diagnosis not present

## 2020-11-03 DIAGNOSIS — Z79899 Other long term (current) drug therapy: Secondary | ICD-10-CM | POA: Insufficient documentation

## 2020-11-03 DIAGNOSIS — C50211 Malignant neoplasm of upper-inner quadrant of right female breast: Secondary | ICD-10-CM | POA: Insufficient documentation

## 2020-11-03 DIAGNOSIS — Z79810 Long term (current) use of selective estrogen receptor modulators (SERMs): Secondary | ICD-10-CM | POA: Diagnosis not present

## 2020-11-03 DIAGNOSIS — D500 Iron deficiency anemia secondary to blood loss (chronic): Secondary | ICD-10-CM | POA: Diagnosis not present

## 2020-11-03 MED ORDER — SODIUM CHLORIDE 0.9 % IV SOLN
300.0000 mg | Freq: Once | INTRAVENOUS | Status: AC
  Administered 2020-11-03: 300 mg via INTRAVENOUS
  Filled 2020-11-03: qty 300

## 2020-11-03 MED ORDER — SODIUM CHLORIDE 0.9 % IV SOLN
Freq: Once | INTRAVENOUS | Status: AC
  Filled 2020-11-03: qty 250

## 2020-11-03 MED ORDER — LORATADINE 10 MG PO TABS
10.0000 mg | ORAL_TABLET | Freq: Once | ORAL | Status: AC
  Administered 2020-11-03: 10 mg via ORAL

## 2020-11-03 MED ORDER — LORATADINE 10 MG PO TABS
ORAL_TABLET | ORAL | Status: AC
  Filled 2020-11-03: qty 1

## 2020-11-03 NOTE — Patient Instructions (Signed)

## 2020-11-07 ENCOUNTER — Encounter: Payer: Self-pay | Admitting: *Deleted

## 2020-11-08 DIAGNOSIS — C50211 Malignant neoplasm of upper-inner quadrant of right female breast: Secondary | ICD-10-CM | POA: Diagnosis not present

## 2020-11-08 DIAGNOSIS — Z170 Estrogen receptor positive status [ER+]: Secondary | ICD-10-CM | POA: Diagnosis not present

## 2020-11-09 ENCOUNTER — Telehealth: Payer: Self-pay | Admitting: *Deleted

## 2020-11-09 ENCOUNTER — Encounter: Payer: Self-pay | Admitting: *Deleted

## 2020-11-09 ENCOUNTER — Other Ambulatory Visit: Payer: Self-pay

## 2020-11-09 ENCOUNTER — Ambulatory Visit
Admission: RE | Admit: 2020-11-09 | Discharge: 2020-11-09 | Disposition: A | Discharge status: Home / Self Care | Payer: BC Managed Care – PPO | Source: Ambulatory Visit | Attending: General Surgery | Admitting: General Surgery

## 2020-11-09 DIAGNOSIS — C50211 Malignant neoplasm of upper-inner quadrant of right female breast: Secondary | ICD-10-CM

## 2020-11-09 DIAGNOSIS — Z170 Estrogen receptor positive status [ER+]: Secondary | ICD-10-CM

## 2020-11-09 IMAGING — MR MR BREAST BILAT WO/W CM
11 of 14 series · 31 of 48 positions shown · IV contrast (10 ML GADAVIST)
Comparison: Previous exams including diagnostic mammogram and
ultrasound dated [DATE].

CLINICAL DATA: Breast cancer, staging. Ultrasound-guided biopsy
performed on [DATE] for an ill-defined mass within the RIGHT
breast at the 12:30 o'clock position revealed invasive mammary
carcinoma with lobular features.

LABS:  Not performed at [REDACTED].
EXAM:
BILATERAL BREAST MRI WITH AND WITHOUT CONTRAST
TECHNIQUE: Multiplanar, multisequence MR images of both breasts were obtained
prior to and following the intravenous administration of 10 ml of
Gadavist

[Series 3: t2_tirm_tra ipat (a-p) · axial · 3.0mm · 0.70mm/px · 1 of 52 slices shown]
[im 1/52]
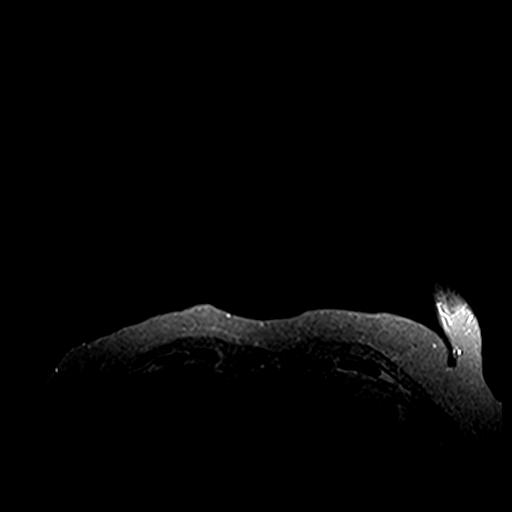

[Series 4: fl3d pre-cm no · axial · non-contrast · 1.2mm · 0.94mm/px · z∈[-85,+86]mm · 3 of 144 slices shown]
[im 1/144]
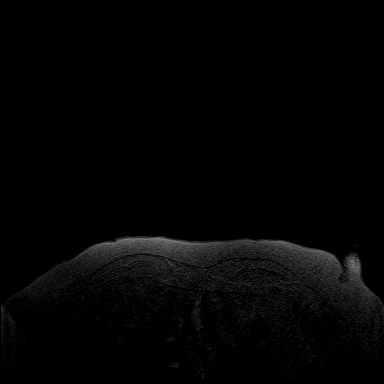
[im 72/144]
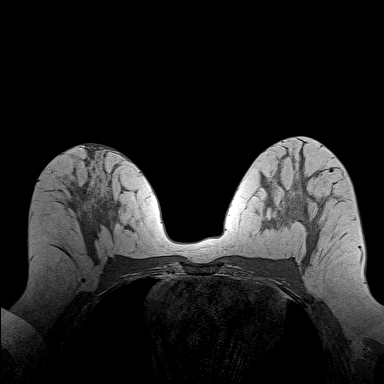
[im 144/144]
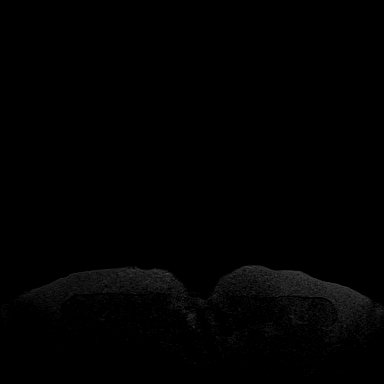

[Series 5: fl3d pre-cm · axial · non-contrast · 1.2mm · 0.94mm/px · z∈[-85,+86]mm · 3 of 144 slices shown]
[im 1/144]
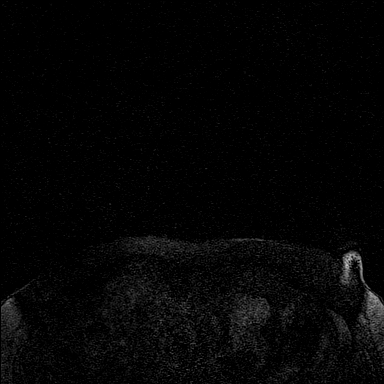
[im 72/144]
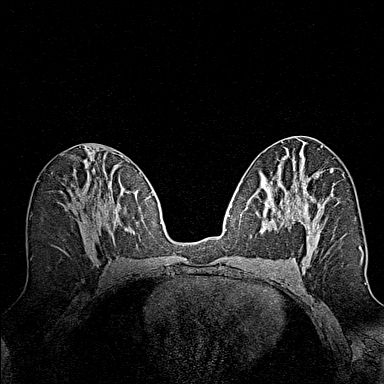
[im 144/144]
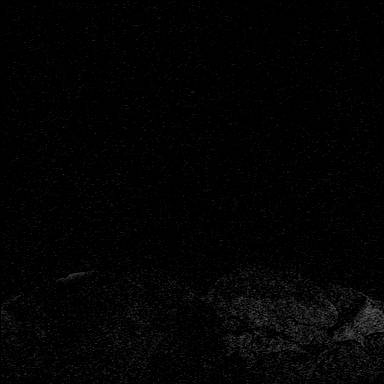

[Series 6: fl3d post-cm 20 · axial · 1.2mm · 0.94mm/px · z∈[-85,+86]mm · 3 of 144 slices shown (1 of 3)]
[im 1/144]
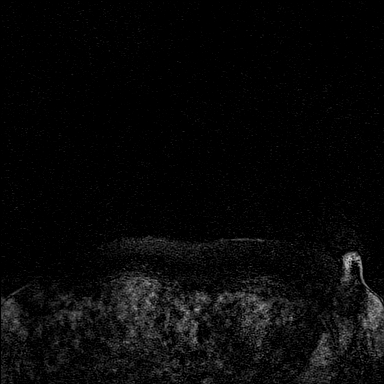
[im 72/144]
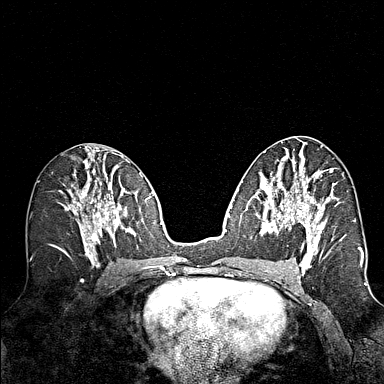
[im 144/144]
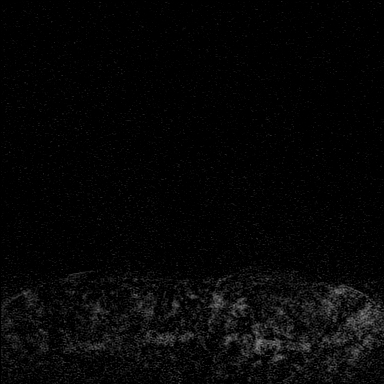

[Series 7: fl3d post-cm 20 · axial · 1.2mm · 0.94mm/px · z∈[-85,+86]mm · 3 of 144 slices shown (2 of 3)]
[im 1/144]
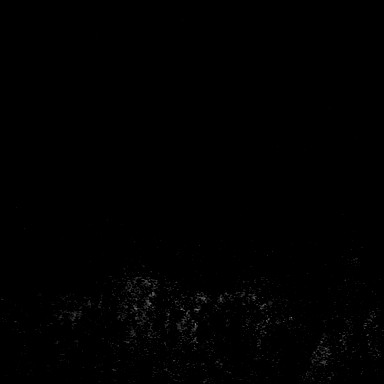
[im 72/144]
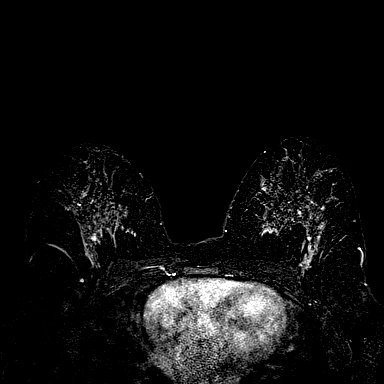
[im 144/144]
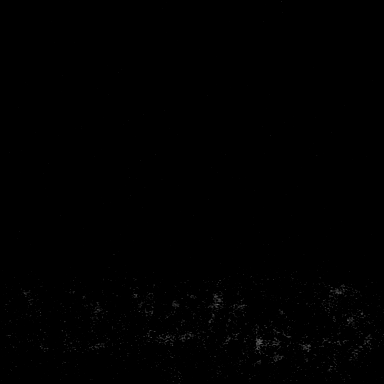

[Series 8: fl3d post-cm 20 · axial · 172.8mm · 0.94mm/px · 1 of 1 slices shown (3 of 3)]
[im 1/1]
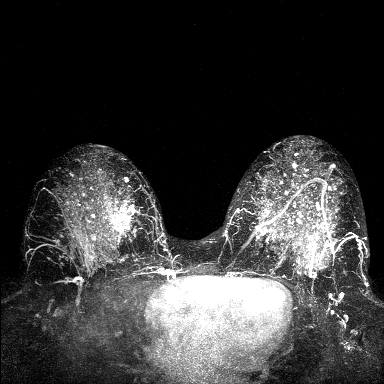

[Series 9: fl3d post-cm 3min · axial · 1.2mm · 0.94mm/px · z∈[-85,+86]mm · 4 of 144 slices shown]
[im 1/144]
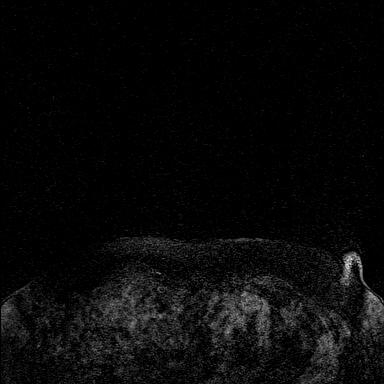
[im 48/144]
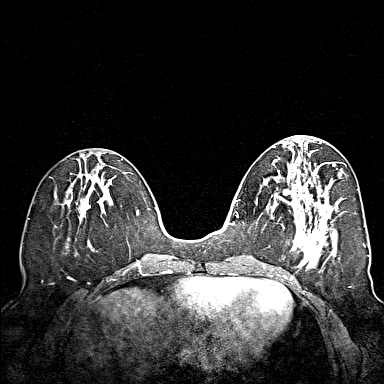
[im 96/144]
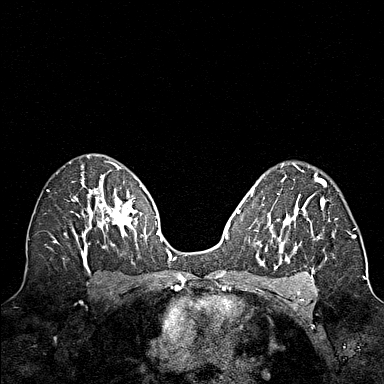
[im 144/144]
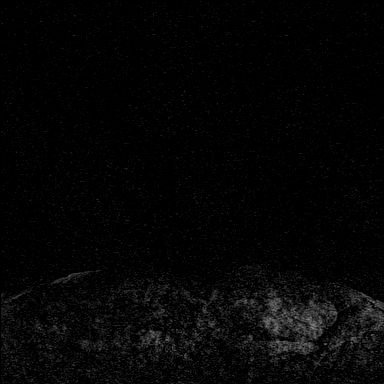

[Series 10: fl3d post-cm 3min_sub · axial · 1.2mm · 0.94mm/px · z∈[-85,+86]mm · 4 of 144 slices shown]
[im 1/144]
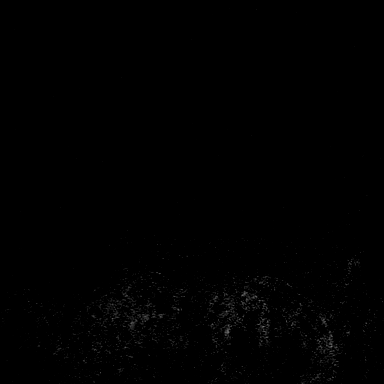
[im 48/144]
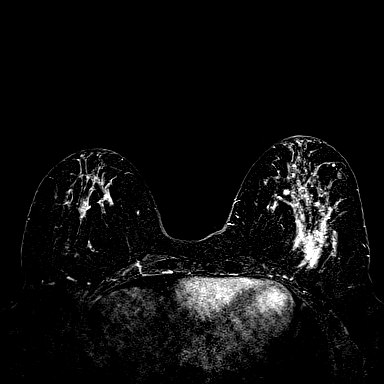
[im 96/144]
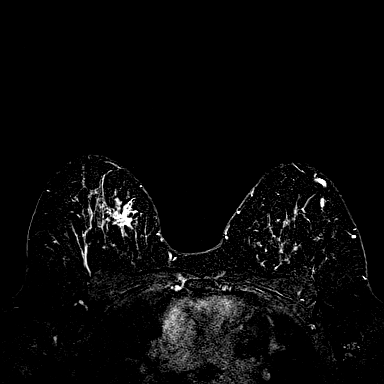
[im 144/144]
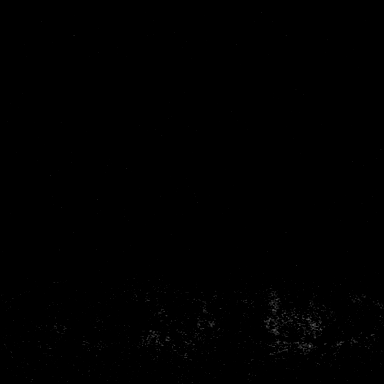

[Series 11: fl3d post-cm 3min_sub_mip_tra · axial · 172.8mm · 0.94mm/px · 1 of 1 slices shown]
[im 1/1]
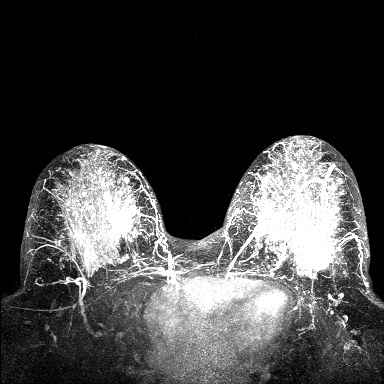

[Series 12: fl3d post-cm 5min · axial · 1.2mm · 0.94mm/px · z∈[-85,+86]mm · 4 of 144 slices shown]
[im 1/144]
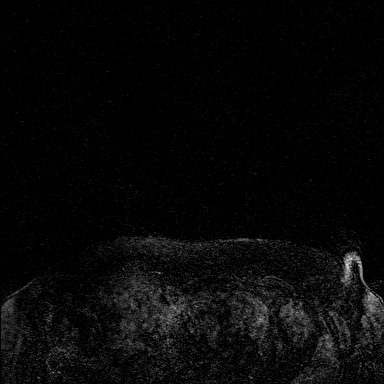
[im 48/144]
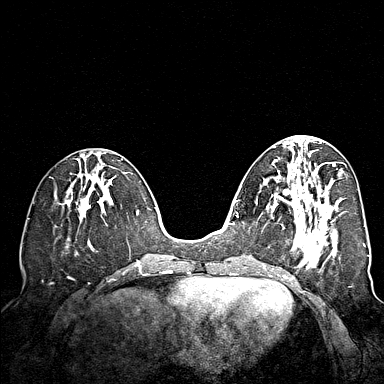
[im 96/144]
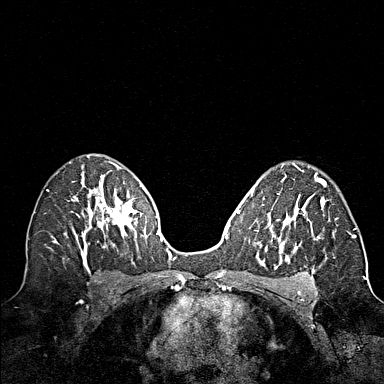
[im 144/144]
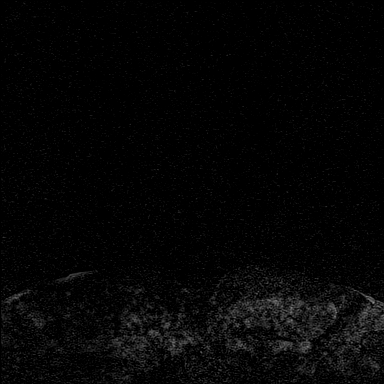

[Series 13: fl3d post-cm 5min_sub · axial · 1.2mm · 0.94mm/px · z∈[-85,+86]mm · 4 of 144 slices shown]
[im 1/144]
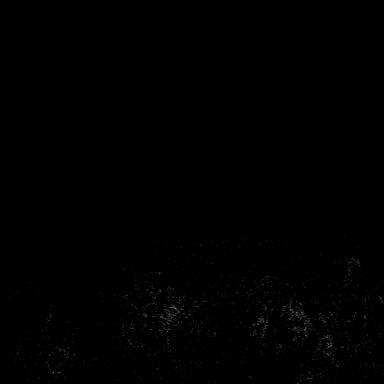
[im 48/144]
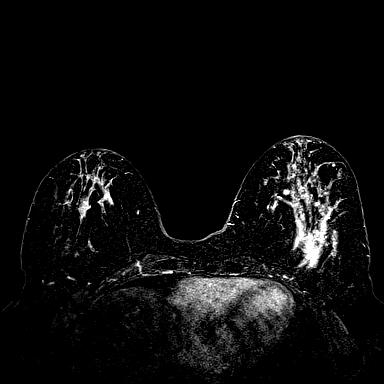
[im 96/144]
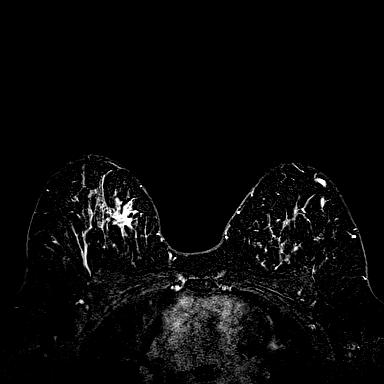
[im 144/144]
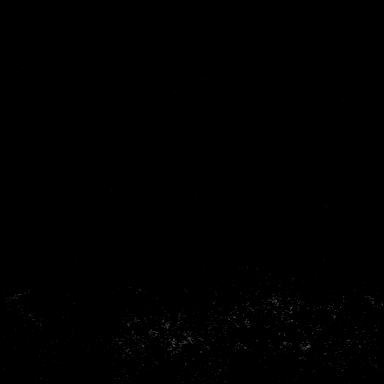

[31 of 48 positions shown; findings below may reference images not displayed]

Three-dimensional MR images were rendered by post-processing of the
original MR data on an independent workstation. The
three-dimensional MR images were interpreted, and findings are
reported in the following complete MRI report for this study. Three
dimensional images were evaluated at the independent interpreting
workstation using the DynaCAD thin client.
FINDINGS: Breast composition: c. Heterogeneous fibroglandular tissue.

Background parenchymal enhancement: Moderate.

Right breast: Spiculated enhancing mass within the upper inner
quadrant of the RIGHT breast, measuring 3.6 x 2.7 x 2 cm (AP by
transverse by craniocaudal dimensions), containing biopsy clip
artifact, corresponding to patient's known invasive carcinoma
(series 10, image 52). There is contiguous linear non-mass
enhancement extending superior-lateral to the spiculated mass,
increasing overall craniocaudal dimension to 3 cm (superior extent
demonstrated on series 10, image 37).

Additional irregular enhancing mass within the inner RIGHT breast, 3
o'clock axis region, at anterior depth, measuring 7 mm, with mixed
enhancement characteristics including suspicious washout (series 10,
image 81).

No additional suspicious enhancing mass, non-mass enhancement or
secondary signs of malignancy elsewhere within the RIGHT breast.

Left breast: Linear clumped non-mass enhancement within the upper
inner quadrant of the LEFT breast, at posterior depth, with
suspicious washout kinetics, measuring 1.8 cm (series 10, images 41
through 48).

No additional suspicious enhancing mass, non-mass enhancement or
secondary signs of malignancy elsewhere within the LEFT breast.

Lymph nodes: No abnormal appearing lymph nodes.

Ancillary findings:  None.
IMPRESSION: 1. Known biopsy-proven invasive carcinoma within the upper inner
quadrant of the RIGHT breast, measuring 3.6 cm, containing a biopsy
clip. Contiguous linear non-mass enhancement extends
superior-lateral to the spiculated mass, increasing overall
craniocaudal dimension to 3 cm.
2. Additional irregular enhancing mass within the inner RIGHT
breast, 3 o'clock axis region, at anterior depth, measuring 7 mm,
with suspicious washout kinetics. MRI-guided biopsy is recommended
to exclude multifocal disease.
3. Additional linear clumped non-mass enhancement within the upper
inner quadrant of the LEFT breast, at posterior depth, measuring
cm extent, with suspicious washout kinetics. This may represent an
intramammary lymph node. MRI-guided biopsy is recommended to exclude
contralateral disease.

RECOMMENDATION:
Bilateral MRI-guided breast biopsies, sites detailed above.

BI-RADS CATEGORY  4: Suspicious.

## 2020-11-09 MED ORDER — GADOBUTROL 1 MMOL/ML IV SOLN
10.0000 mL | Freq: Once | INTRAVENOUS | Status: AC | PRN
  Administered 2020-11-09: 10 mL via INTRAVENOUS

## 2020-11-09 NOTE — Telephone Encounter (Signed)
Received oncotype score of 19. Physician team notified. Called pt to discuss results. Unable to leave vm and no answer.

## 2020-11-09 NOTE — Telephone Encounter (Signed)
Called pt again to discuss oncotype results and MRI results as well as recommendations for bil MRI bx per Dr. Donne Hazel. No answer, unable to leave vm as it has not been set up. Informed physician team of inability to reach pt to discuss results of testing.

## 2020-11-10 ENCOUNTER — Other Ambulatory Visit: Payer: Self-pay | Admitting: General Surgery

## 2020-11-10 ENCOUNTER — Inpatient Hospital Stay: Payer: BC Managed Care – PPO

## 2020-11-10 ENCOUNTER — Encounter: Payer: Self-pay | Admitting: *Deleted

## 2020-11-10 ENCOUNTER — Telehealth: Payer: Self-pay | Admitting: Genetic Counselor

## 2020-11-10 ENCOUNTER — Ambulatory Visit: Payer: Self-pay | Admitting: Genetic Counselor

## 2020-11-10 ENCOUNTER — Encounter: Payer: Self-pay | Admitting: Hematology

## 2020-11-10 ENCOUNTER — Inpatient Hospital Stay: Payer: BC Managed Care – PPO | Admitting: General Practice

## 2020-11-10 VITALS — BP 121/66 | HR 80 | Temp 98.9°F | Resp 17

## 2020-11-10 DIAGNOSIS — Z1379 Encounter for other screening for genetic and chromosomal anomalies: Secondary | ICD-10-CM

## 2020-11-10 DIAGNOSIS — Z79810 Long term (current) use of selective estrogen receptor modulators (SERMs): Secondary | ICD-10-CM | POA: Diagnosis not present

## 2020-11-10 DIAGNOSIS — N920 Excessive and frequent menstruation with regular cycle: Secondary | ICD-10-CM | POA: Diagnosis not present

## 2020-11-10 DIAGNOSIS — Z170 Estrogen receptor positive status [ER+]: Secondary | ICD-10-CM | POA: Diagnosis not present

## 2020-11-10 DIAGNOSIS — C50211 Malignant neoplasm of upper-inner quadrant of right female breast: Secondary | ICD-10-CM

## 2020-11-10 DIAGNOSIS — D500 Iron deficiency anemia secondary to blood loss (chronic): Secondary | ICD-10-CM | POA: Diagnosis not present

## 2020-11-10 DIAGNOSIS — G43909 Migraine, unspecified, not intractable, without status migrainosus: Secondary | ICD-10-CM | POA: Diagnosis not present

## 2020-11-10 DIAGNOSIS — Z8042 Family history of malignant neoplasm of prostate: Secondary | ICD-10-CM

## 2020-11-10 DIAGNOSIS — Z79899 Other long term (current) drug therapy: Secondary | ICD-10-CM | POA: Diagnosis not present

## 2020-11-10 MED ORDER — LORATADINE 10 MG PO TABS
10.0000 mg | ORAL_TABLET | Freq: Once | ORAL | Status: AC
  Administered 2020-11-10: 10 mg via ORAL
  Filled 2020-11-10: qty 1

## 2020-11-10 MED ORDER — SODIUM CHLORIDE 0.9 % IV SOLN
Freq: Once | INTRAVENOUS | Status: AC
  Filled 2020-11-10: qty 250

## 2020-11-10 MED ORDER — SODIUM CHLORIDE 0.9 % IV SOLN
300.0000 mg | Freq: Once | INTRAVENOUS | Status: AC
  Administered 2020-11-10: 300 mg via INTRAVENOUS
  Filled 2020-11-10: qty 300

## 2020-11-10 NOTE — Progress Notes (Signed)
[place] CSW Progress Notes  Met w patient in infusion, provided supportive listening.  She is distressed as she learned of additional issues with her cancer, meaning more treatment.  She learned of this via My Chart and looks forward to connecting with her oncologist to learn treatment plan. She has support family and friends, including aunt who is a breast cancer survivor and RN - this has been a good source of encouragement to her.  She appreciates the support from family and also values time to process information on her own.  Validated need for her to find her own path through cancer and treatment - she relies on faith and family as her stability.  She has my card and is encouraged to contact us as needed for support/resources.  Edwyna Shell, LCSW Clinical Social Worker Phone:  [phone]

## 2020-11-10 NOTE — Progress Notes (Signed)
Met with patient at registration to introduce myself as Financial Resource Specialist and to offer available resources.  Discussed one-time $1000 [employer] grant and qualifications to assist with personal expenses while going through treatment.  Gave her my card if interested in applying and for any additional financial questions or concerns. 

## 2020-11-10 NOTE — Telephone Encounter (Signed)
Revealed negative genetic testing and variants of uncertain significance in [place] and SUFU.  Discussed that we do not know why she has breast cancer or why there is cancer in the family. It could be sporadic/familial, due to a different gene that we are not testing, or maybe our current technology may not be able to pick something up.  It will be important for her to keep in contact with genetics to keep up with whether additional testing may be needed.

## 2020-11-12 ENCOUNTER — Encounter: Payer: Self-pay | Admitting: Hematology

## 2020-11-12 NOTE — Progress Notes (Addendum)
HPI:  Ms. Stahlman was previously seen in the [facility] due to a personal and family history of cancer and concerns regarding a hereditary predisposition to cancer. Please refer to our prior cancer genetics clinic note for more information regarding our discussion, assessment and recommendations, at the time. Ms. Couillard recent genetic test results were disclosed to her, as were recommendations warranted by these results. These results and recommendations are discussed in more detail below.  CANCER HISTORY:  Oncology History Overview Note  Cancer Staging Malignant neoplasm of upper-inner quadrant of right breast in female, estrogen receptor positive ([place]) Staging form: Breast, AJCC 8th Edition - Clinical stage from 10/06/2020: Stage IB (cT2, cN0, cM0, G2, ER+, PR+, HER2-) - Signed by Truitt Merle, MD on 10/17/2020 Stage prefix: Initial diagnosis Histologic grading system: 3 grade system    Malignant neoplasm of upper-inner quadrant of right breast in female, estrogen receptor positive ([place])  10/06/2020 Mammogram   DIGITAL DIAGNOSTIC BILATERAL MAMMOGRAM WITH TOMOSYNTHESIS AND CAD; ULTRASOUND RIGHT BREAST LIMITED  IMPRESSION: Two contiguous hypoechoic masses in the right breast- an irregular hypoechoic mass in the right breast at 12:30 3 cm from the nipple measuring 2.5 x 2.1 x 3.0 cm, and a hypoechoic heterogeneous mass at 12:30 2 cm from the nipple measuring 3.0 x 1.6 x 2.8 cm. Sonographic evaluation of the right axilla does not show any enlarged adenopathy.   10/06/2020 Pathology Results   Diagnosis Breast, right, needle core biopsy - INVASIVE MAMMARY CARCINOMA WITH LOBULAR FEATURES - SEE COMMENT Microscopic Comment The biopsy material shows an infiltrative proliferation of cells with large vesicular nuclei with inconspicuous nucleoli, arranged linearly and in small clusters. Based on the biopsy, the carcinoma appears Nottingham grade 2 of 3 and measures 0.9 cm in greatest  linear extent  E-cadherin is POSITIVE supporting a ductal origin.  PROGNOSTIC INDICATORS Results: IMMUNOHISTOCHEMICAL AND MORPHOMETRIC ANALYSIS PERFORMED MANUALLY The tumor cells are NEGATIVE for Her2 (1+). Estrogen Receptor: 80%, POSITIVE, STRONG STAINING INTENSITY Progesterone Receptor: 90%, POSITIVE, STRONG STAINING INTENSITY Proliferation Marker Ki67: 10%   10/06/2020 Cancer Staging   Staging form: Breast, AJCC 8th Edition - Clinical stage from 10/06/2020: Stage IB (cT2, cN0, cM0, G2, ER+, PR+, HER2-) - Signed by Truitt Merle, MD on 10/17/2020 Stage prefix: Initial diagnosis Histologic grading system: 3 grade system   10/13/2020 Initial Diagnosis   Malignant neoplasm of upper-inner quadrant of right breast in female, estrogen receptor positive ([employer])   10/24/2020 Genetic Testing   Negative hereditary cancer genetic testing: no pathogenic variants detected in [facility] BRCAPlus Panel and [facility] CancerNext-Expanded +RNAinsight.  Variants of uncertain significance detected in [place] at p.Y2H (c.4T>C) and SUFU at  p.A138T (c.412G>A). The report dates are October 24, 2020 and October 27, 2020, respectively.    The BRCAplus panel offered by [employer] and includes sequencing and deletion/duplication analysis for the following 8 genes: ATM, BRCA1, BRCA2, CDH1, CHEK2, PALB2, PTEN, and TP53.  The CancerNext-Expanded gene panel offered by [facility] and includes sequencing, rearrangement, and RNA analysis for the following 77 genes: AIP, ALK, APC, ATM, AXIN2, BAP1, BARD1, BLM, BMPR1A, BRCA1, BRCA2, BRIP1, CDC73, CDH1, CDK4, CDKN1B, CDKN2A, CHEK2, CTNNA1, DICER1, FANCC, FH, FLCN, GALNT12, KIF1B, LZTR1, MAX, MEN1, MET, MLH1, MSH2, MSH3, MSH6, MUTYH, NBN, NF1, NF2, NTHL1, PALB2, PHOX2B, PMS2, POT1, PRKAR1A, PTCH1, PTEN, RAD51C, RAD51D, RB1, RECQL, RET, SDHA, SDHAF2, SDHB, SDHC, SDHD, SMAD4, SMARCA4, SMARCB1, SMARCE1, STK11, SUFU, TMEM127, TP53, TSC1, TSC2, VHL and XRCC2 (sequencing and deletion/duplication); EGFR,  EGLN1, HOXB13, KIT, MITF, PDGFRA, POLD1,  and POLE (sequencing only); EPCAM and GREM1 (deletion/duplication only).      FAMILY HISTORY:  We obtained a detailed, 4-generation family history.  Significant diagnoses are listed below: Family History  Problem Relation Age of Onset   Prostate cancer Maternal Uncle        dx after 100   Cancer Maternal Uncle        unknown type dx after 50; prostate? lung?   Prostate cancer Paternal Uncle        3 paternal uncles with prostate cancer dx after 78   Prostate cancer Paternal Grandfather        dx after 42     Ms. Deutschman is unaware of previous family history of genetic testing for hereditary cancer risks besides that mentioned above.  She stated that her paternal female cousin may have had positive hereditary cancer genetic testing.  No report was available for review today; however, contact information was provided in the event that Ms. Forehand is able to access her cousin's genetic testing report. There is no reported Ashkenazi Jewish ancestry. There is no known consanguinity.   GENETIC TEST RESULTS: Genetic testing reported out on October 27, 2020.  The [facility] CancerNext-Expanded +RNAinsight Panel found no pathogenic mutations. The CancerNext-Expanded gene panel offered by [facility] and includes sequencing, rearrangement, and RNA analysis for the following 77 genes: AIP, ALK, APC, ATM, AXIN2, BAP1, BARD1, BLM, BMPR1A, BRCA1, BRCA2, BRIP1, CDC73, CDH1, CDK4, CDKN1B, CDKN2A, CHEK2, CTNNA1, DICER1, FANCC, FH, FLCN, GALNT12, KIF1B, LZTR1, MAX, MEN1, MET, MLH1, MSH2, MSH3, MSH6, MUTYH, NBN, NF1, NF2, NTHL1, PALB2, PHOX2B, PMS2, POT1, PRKAR1A, PTCH1, PTEN, RAD51C, RAD51D, RB1, RECQL, RET, SDHA, SDHAF2, SDHB, SDHC, SDHD, SMAD4, SMARCA4, SMARCB1, SMARCE1, STK11, SUFU, TMEM127, TP53, TSC1, TSC2, VHL and XRCC2 (sequencing and deletion/duplication); EGFR, EGLN1, HOXB13, KIT, MITF, PDGFRA, POLD1, and POLE (sequencing only); EPCAM and GREM1 (deletion/duplication  only).   The test report has been scanned into EPIC and is located under the Molecular Pathology section of the Results Review tab.  A portion of the result report is included below for reference.     We discussed with Ms. Gannett that because current genetic testing is not perfect, it is possible there may be a gene mutation in one of these genes that current testing cannot detect, but that chance is small.  We also discussed, that there could be another gene that has not yet been discovered, or that we have not yet tested, that is responsible for the cancer diagnoses in the family. It is also possible there is a hereditary cause for the cancer in the family that Ms. Dubray did not inherit and therefore was not identified in her testing.  Therefore, it is important to remain in touch with cancer genetics in the future so that we can continue to offer Ms. Joles the most up to date genetic testing.   Genetic testing did identify two variants of uncertain significance (VUS) - one in the FH gene called p.Y2H (c.4T>C) and a second in the SUFU gene called p.A138T (p.c.412G>A). At this time, it is unknown if these variants are associated with increased cancer risk or if they are normal findings, but most variants such as these get reclassified to being inconsequential. They should not be used to make medical management decisions. With time, we suspect the lab will determine the significance of these variants, if any. If we do learn more about them, we will try to contact Ms. Windhorst to discuss it further. However, it is important  to stay in touch with [place] periodically and keep the address and phone number up to date.  Update: The variant of uncertain significance (VUS) in FH at p.Y2H has been reclassified to likely benign.  The change in variant classification was made as a result of re-review of evidence in light of new variant interpretation guidelines and/or new information. The amended report date is  March 15, 2021.     ADDITIONAL GENETIC TESTING: We discussed with Ms. Branan that her genetic testing was fairly extensive.  If there are genes identified to increase cancer risk that can be analyzed in the future, we would be happy to discuss and coordinate this testing at that time.    CANCER SCREENING RECOMMENDATIONS: Ms. Zumbro test result is considered negative (normal).  This means that we have not identified a hereditary cause for her personal history of cancer at this time. Most cancers happen by chance and this negative test suggests that her cancer could fall into this category.    While reassuring, this does not definitively rule out a hereditary predisposition to cancer. It is still possible that there could be genetic mutations that are undetectable by current technology. There could be genetic mutations in genes that have not been tested or identified to increase cancer risk.  Therefore, it is recommended she continue to follow the cancer management and screening guidelines provided by her oncology and primary healthcare provider.   An individual's cancer risk and medical management are not determined by genetic test results alone. Overall cancer risk assessment incorporates additional factors, including personal medical history, family history, and any available genetic information that may result in a personalized plan for cancer prevention and surveillance  RECOMMENDATIONS FOR FAMILY MEMBERS:  Individuals in this family might be at some increased risk of developing cancer, over the general population risk, simply due to the family history of cancer.  We recommended women in this family have a yearly mammogram beginning at age 75, or 11 years younger than the earliest onset of cancer, an annual clinical breast exam, and perform monthly breast self-exams. Women in this family should also have a gynecological exam as recommended by their primary provider. All family members should be  referred for colonoscopy starting at age 55.  It is also possible there is a hereditary cause for the cancer in Ms. Walthour's family that she did not inherit and therefore was not identified in her.  Based on Ms. Waid's family history, we recommended her paternal relatives with a history of prostate cancer have genetic counseling and testing. Ms. Dacey can let us know if we can be of any assistance in coordinating genetic counseling and/or testing for this family member.   FOLLOW-UP: Lastly, we discussed with Ms. Suitt that cancer genetics is a rapidly advancing field and it is possible that new genetic tests will be appropriate for her and/or her family members in the future. We encouraged her to remain in contact with cancer genetics on an annual basis so we can update her personal and family histories and let her know of advances in cancer genetics that may benefit this family.   Our contact number was provided. Ms. Borgmeyer questions were answered to her satisfaction, and she knows she is welcome to call us at anytime with additional questions or concerns.    Akaysha Cobern M. Joette Catching, [employer], [facility] Genetic Counselor [email or web address] (P) [phone]

## 2020-11-13 ENCOUNTER — Other Ambulatory Visit: Payer: Self-pay | Admitting: General Surgery

## 2020-11-13 DIAGNOSIS — C50211 Malignant neoplasm of upper-inner quadrant of right female breast: Secondary | ICD-10-CM

## 2020-11-13 DIAGNOSIS — Z170 Estrogen receptor positive status [ER+]: Secondary | ICD-10-CM

## 2020-11-15 ENCOUNTER — Encounter: Payer: Self-pay | Admitting: Hematology

## 2020-11-16 ENCOUNTER — Encounter: Payer: Self-pay | Admitting: *Deleted

## 2020-11-17 ENCOUNTER — Other Ambulatory Visit: Payer: Self-pay

## 2020-11-17 ENCOUNTER — Inpatient Hospital Stay: Payer: BC Managed Care – PPO

## 2020-11-17 VITALS — BP 114/57 | HR 72 | Temp 98.4°F | Resp 18

## 2020-11-17 DIAGNOSIS — Z79810 Long term (current) use of selective estrogen receptor modulators (SERMs): Secondary | ICD-10-CM | POA: Diagnosis not present

## 2020-11-17 DIAGNOSIS — D500 Iron deficiency anemia secondary to blood loss (chronic): Secondary | ICD-10-CM | POA: Diagnosis not present

## 2020-11-17 DIAGNOSIS — Z79899 Other long term (current) drug therapy: Secondary | ICD-10-CM | POA: Diagnosis not present

## 2020-11-17 DIAGNOSIS — C50211 Malignant neoplasm of upper-inner quadrant of right female breast: Secondary | ICD-10-CM | POA: Diagnosis not present

## 2020-11-17 DIAGNOSIS — N920 Excessive and frequent menstruation with regular cycle: Secondary | ICD-10-CM | POA: Diagnosis not present

## 2020-11-17 DIAGNOSIS — Z170 Estrogen receptor positive status [ER+]: Secondary | ICD-10-CM | POA: Diagnosis not present

## 2020-11-17 DIAGNOSIS — G43909 Migraine, unspecified, not intractable, without status migrainosus: Secondary | ICD-10-CM | POA: Diagnosis not present

## 2020-11-17 MED ORDER — SODIUM CHLORIDE 0.9 % IV SOLN
300.0000 mg | Freq: Once | INTRAVENOUS | Status: AC
  Administered 2020-11-17: 300 mg via INTRAVENOUS
  Filled 2020-11-17: qty 300

## 2020-11-17 MED ORDER — LORATADINE 10 MG PO TABS
10.0000 mg | ORAL_TABLET | Freq: Once | ORAL | Status: AC
  Administered 2020-11-17: 10 mg via ORAL
  Filled 2020-11-17: qty 1

## 2020-11-17 MED ORDER — SODIUM CHLORIDE 0.9 % IV SOLN: Freq: Once | INTRAVENOUS | Status: AC

## 2020-11-17 NOTE — Patient Instructions (Signed)
[facility] MEDICAL ONCOLOGY  Discharge Instructions: Thank you for choosing [facility] to provide your oncology and hematology care.   If you have a lab appointment with the Cancer Center, please go directly to the Cancer Center and check in at the registration area.   Wear comfortable clothing and clothing appropriate for easy access to any Portacath or PICC line.   We strive to give you quality time with your provider. You may need to reschedule your appointment if you arrive late (15 or more minutes).  Arriving late affects you and other patients whose appointments are after yours.  Also, if you miss three or more appointments without notifying the office, you may be dismissed from the clinic at the provider's discretion.      For prescription refill requests, have your pharmacy contact our office and allow 72 hours for refills to be completed.    Today you received the following medication - Venofer      To help prevent nausea and vomiting after your treatment, we encourage you to take your nausea medication as directed.  BELOW ARE SYMPTOMS THAT SHOULD BE REPORTED IMMEDIATELY: *FEVER GREATER THAN 100.4 F (38 C) OR HIGHER *CHILLS OR SWEATING *NAUSEA AND VOMITING THAT IS NOT CONTROLLED WITH YOUR NAUSEA MEDICATION *UNUSUAL SHORTNESS OF BREATH *UNUSUAL BRUISING OR BLEEDING *URINARY PROBLEMS (pain or burning when urinating, or frequent urination) *BOWEL PROBLEMS (unusual diarrhea, constipation, pain near the anus) TENDERNESS IN MOUTH AND THROAT WITH OR WITHOUT PRESENCE OF ULCERS (sore throat, sores in mouth, or a toothache) UNUSUAL RASH, SWELLING OR PAIN  UNUSUAL VAGINAL DISCHARGE OR ITCHING   Items with * indicate a potential emergency and should be followed up as soon as possible or go to the Emergency Department if any problems should occur.  Please show the CHEMOTHERAPY ALERT CARD or IMMUNOTHERAPY ALERT CARD at check-in to the Emergency Department and  triage nurse.  Should you have questions after your visit or need to cancel or reschedule your appointment, please contact [facility]  Dept: [phone]  and follow the prompts.  Office hours are 8:00 a.m. to 4:30 p.m. Monday - Friday. Please note that voicemails left after 4:00 p.m. may not be returned until the following business day.  We are closed weekends and major holidays. You have access to a nurse at all times for urgent questions. Please call the main number to the clinic Dept: [phone] and follow the prompts.   For any non-urgent questions, you may also contact your provider using MyChart. We now offer e-Visits for anyone 18 and older to request care online for non-urgent symptoms. For details visit [email or web address].   Also download the MyChart app! Go to the app store, search "MyChart", open the app, select [facility], and log in with your MyChart username and password.  Due to Covid, a mask is required upon entering the hospital/clinic. If you do not have a mask, one will be given to you upon arrival. For doctor visits, patients may have 1 support person aged 18 or older with them. For treatment visits, patients cannot have anyone with them due to current Covid guidelines and our immunocompromised population.   Iron Sucrose injection What is this medication? IRON SUCROSE (AHY ern SOO krohs) is an iron complex. Iron is used to make healthy red blood cells, which carry oxygen and nutrients throughout the body. This medicine is used to treat iron deficiency anemia in people with chronickidney disease. This medicine may   be used for other purposes; ask your health care provider orpharmacist if you have questions. COMMON BRAND NAME(S): Venofer What should I tell my care team before I take this medication? They need to know if you have any of these conditions: anemia not caused by low iron levels heart disease high levels of iron in the  blood kidney disease liver disease an unusual or allergic reaction to iron, other medicines, foods, dyes, or preservatives pregnant or trying to get pregnant breast-feeding How should I use this medication? This medicine is for infusion into a vein. It is given by a health careprofessional in a hospital or clinic setting. Talk to your pediatrician regarding the use of this medicine in children. While this drug may be prescribed for children as young as 2 years for selectedconditions, precautions do apply. Overdosage: If you think you have taken too much of this medicine contact apoison control center or emergency room at once. NOTE: This medicine is only for you. Do not share this medicine with others. What if I miss a dose? It is important not to miss your dose. Call your doctor or health careprofessional if you are unable to keep an appointment. What may interact with this medication? Do not take this medicine with any of the following medications: deferoxamine dimercaprol other iron products This medicine may also interact with the following medications: chloramphenicol deferasirox This list may not describe all possible interactions. Give your health care provider a list of all the medicines, herbs, non-prescription drugs, or dietary supplements you use. Also tell them if you smoke, drink alcohol, or use illegaldrugs. Some items may interact with your medicine. What should I watch for while using this medication? Visit your doctor or healthcare professional regularly. Tell your doctor or healthcare professional if your symptoms do not start to get better or if theyget worse. You may need blood work done while you are taking this medicine. You may need to follow a special diet. Talk to your doctor. Foods that contain iron include: whole grains/cereals, dried fruits, beans, or peas, leafy greenvegetables, and organ meats (liver, kidney). What side effects may I notice from receiving this  medication? Side effects that you should report to your doctor or health care professionalas soon as possible: allergic reactions like skin rash, itching or hives, swelling of the face, lips, or tongue breathing problems changes in blood pressure cough fast, irregular heartbeat feeling faint or lightheaded, falls fever or chills flushing, sweating, or hot feelings joint or muscle aches/pains seizures swelling of the ankles or feet unusually weak or tired Side effects that usually do not require medical attention (report to yourdoctor or health care professional if they continue or are bothersome): diarrhea feeling achy headache irritation at site where injected nausea, vomiting stomach upset tiredness This list may not describe all possible side effects. Call your doctor for medical advice about side effects. You may report side effects to FDA at1-800-FDA-1088. Where should I keep my medication? This drug is given in a hospital or clinic and will not be stored at home. NOTE: This sheet is a summary. It may not cover all possible information. If you have questions about this medicine, talk to your doctor, pharmacist, orhealth care provider.  2022 Elsevier/Gold Standard (2010-12-27 17:14:35)   

## 2020-11-23 ENCOUNTER — Other Ambulatory Visit: Payer: Self-pay

## 2020-11-23 ENCOUNTER — Other Ambulatory Visit: Payer: Self-pay | Admitting: General Surgery

## 2020-11-23 ENCOUNTER — Other Ambulatory Visit: Payer: BC Managed Care – PPO

## 2020-11-23 ENCOUNTER — Ambulatory Visit: Admission: RE | Admit: 2020-11-23 | Payer: BC Managed Care – PPO | Source: Ambulatory Visit

## 2020-11-23 ENCOUNTER — Ambulatory Visit
Admission: RE | Admit: 2020-11-23 | Discharge: 2020-11-23 | Disposition: A | Discharge status: Home / Self Care | Payer: BC Managed Care – PPO | Source: Ambulatory Visit | Attending: General Surgery | Admitting: General Surgery

## 2020-11-23 DIAGNOSIS — C50211 Malignant neoplasm of upper-inner quadrant of right female breast: Secondary | ICD-10-CM

## 2020-11-23 DIAGNOSIS — Z170 Estrogen receptor positive status [ER+]: Secondary | ICD-10-CM

## 2020-11-23 DIAGNOSIS — C50911 Malignant neoplasm of unspecified site of right female breast: Secondary | ICD-10-CM | POA: Diagnosis not present

## 2020-11-23 IMAGING — MR MR BREAST BX W LOC DEV 1ST LESION IMAGE BX SPEC MR GUIDE*L*
6 of 8 series · 34 of 48 positions shown · IV contrast (10ML GADAVIST)
Comparison: Previous exam(s).

CLINICAL DATA: The patient had an MRI [DATE] for known
breast cancer in the right breast. An MRI guided right breast biopsy
for a separate abnormality was recommended, but the patient is
scheduled for mastectomy. Linear clumped non-mass enhancement in the
upper inner left breast was identified and MRI guided biopsy was
recommended.

LABS:  None
EXAM:
MR OF THE LEFT BREAST WITH AND WITHOUT CONTRAST
TECHNIQUE: Multiplanar multisequence MR images of the left breast were obtained
prior to and following the intravenous administration of 10 ml of
Gadavist.

[Series 2: fiducial unilateral · sagittal · 2.0mm · 1.33mm/px · 1 of 52 slices shown]
[im 1/52]
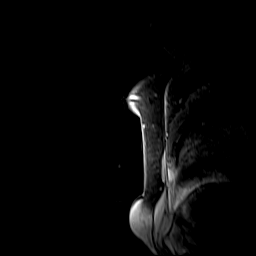

[Series 3: dynamic pre · axial · non-contrast · 1.3mm · 0.73mm/px · z∈[-112,+74]mm · 6 of 144 slices shown]
[im 1/144]
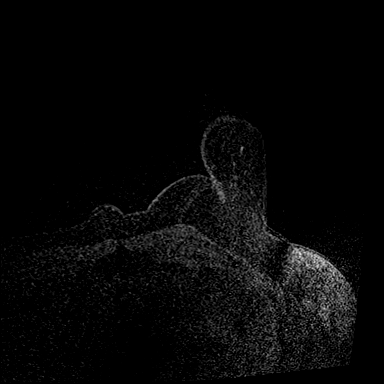
[im 29/144]
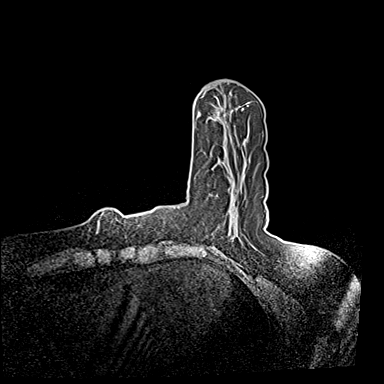
[im 58/144]
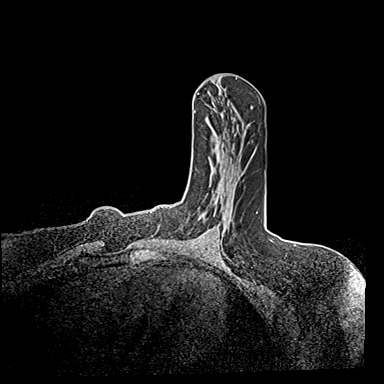
[im 86/144]
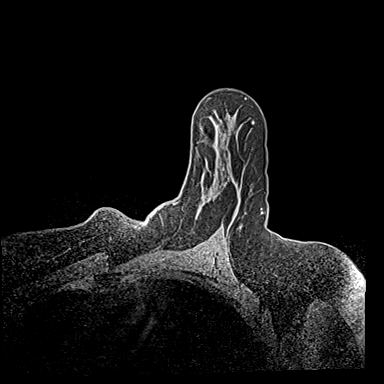
[im 115/144]
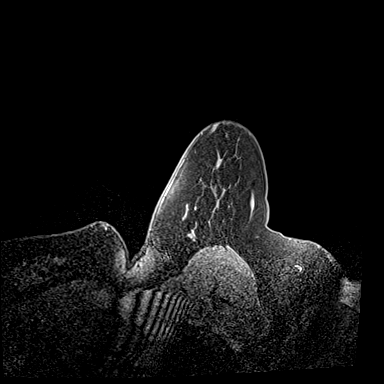
[im 144/144]
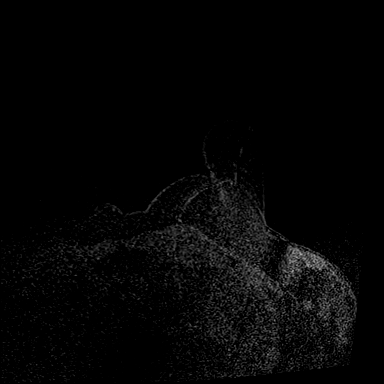

[Series 4: dynamic post 20 · axial · 1.3mm · 0.73mm/px · z∈[-112,+74]mm · 6 of 144 slices shown (1 of 2)]
[im 1/144]
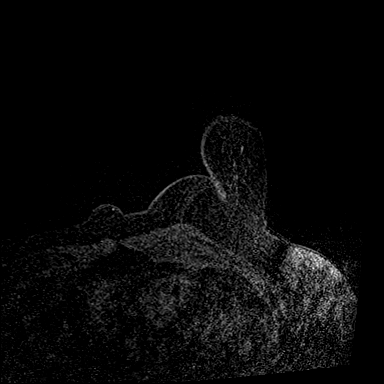
[im 29/144]
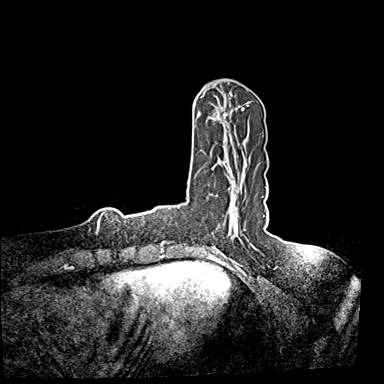
[im 58/144]
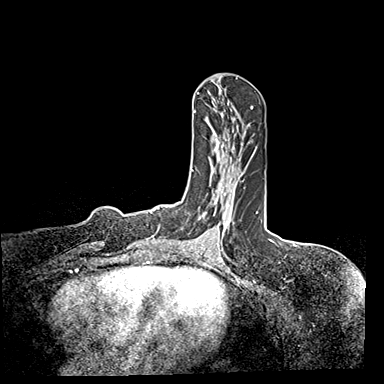
[im 86/144]
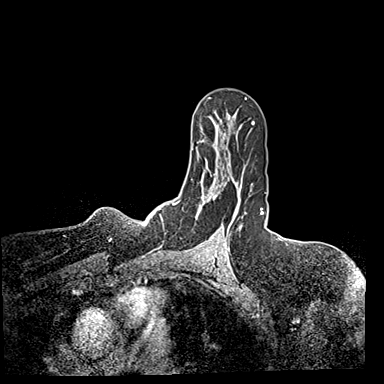
[im 115/144]
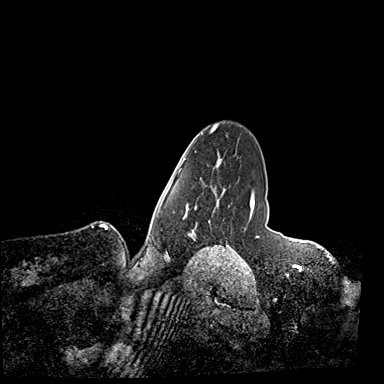
[im 144/144]
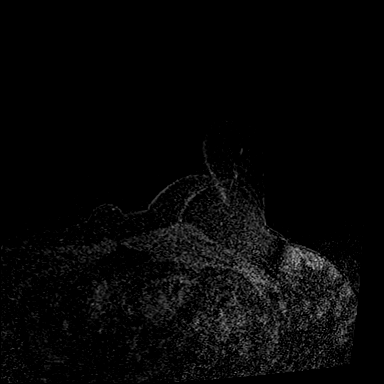

[Series 5: dynamic post 20 · axial · 1.3mm · 0.73mm/px · z∈[-112,+74]mm · 7 of 144 slices shown (2 of 2)]
[im 1/144]
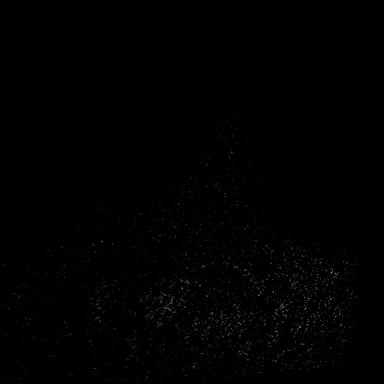
[im 24/144]
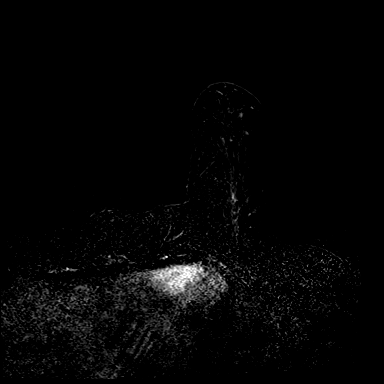
[im 48/144]
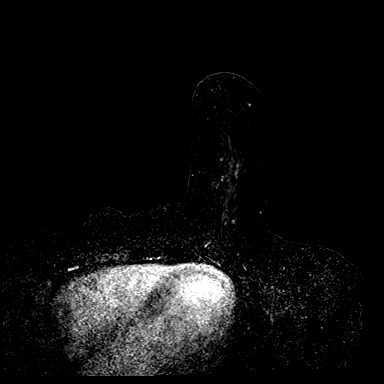
[im 72/144]
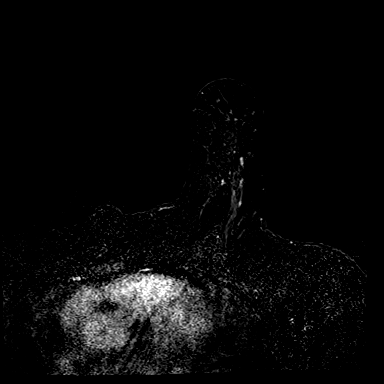
[im 96/144]
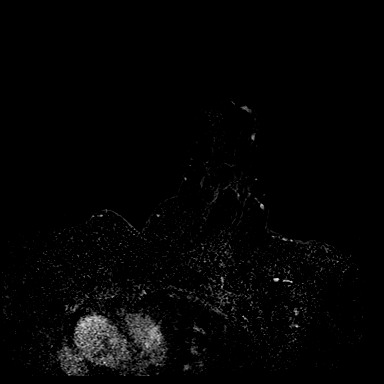
[im 120/144]
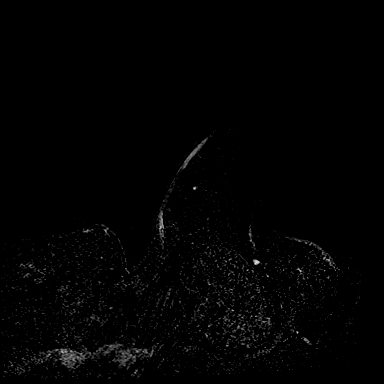
[im 144/144]
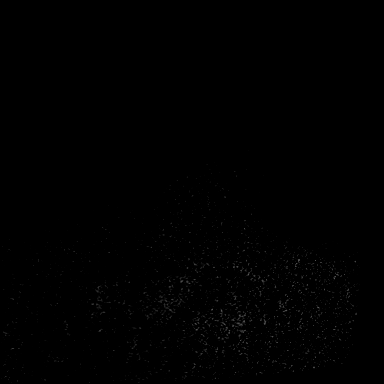

[Series 6: dynamic post 3 · axial · 1.3mm · 0.73mm/px · z∈[-112,+74]mm · 7 of 144 slices shown (1 of 2)]
[im 1/144]
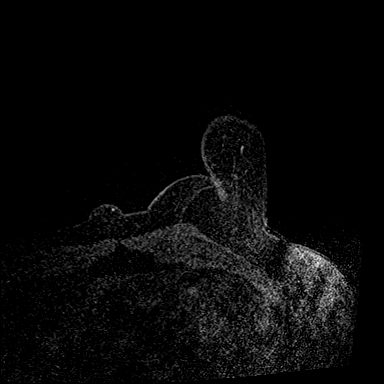
[im 24/144]
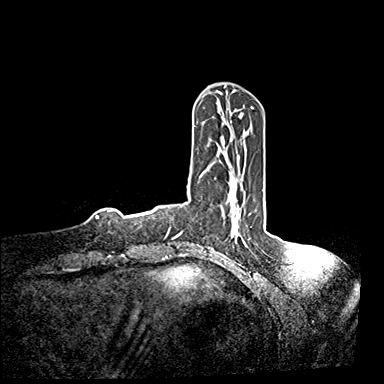
[im 48/144]
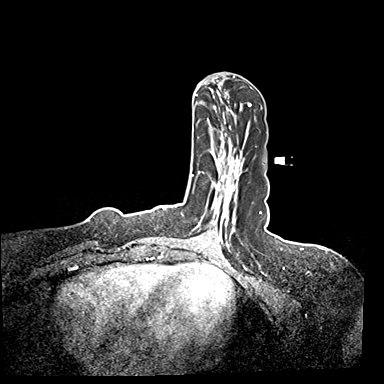
[im 72/144]
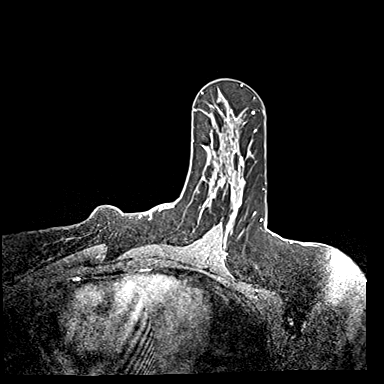
[im 96/144]
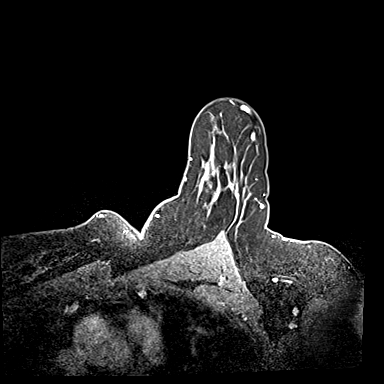
[im 120/144]
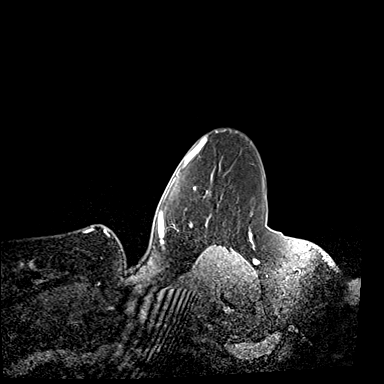
[im 144/144]
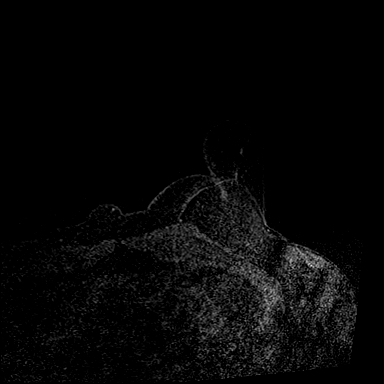

[Series 7: dynamic post 3 · axial · 1.3mm · 0.73mm/px · z∈[-112,+74]mm · 7 of 144 slices shown (2 of 2)]
[im 1/144]
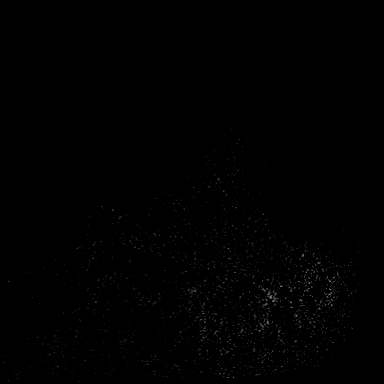
[im 24/144]
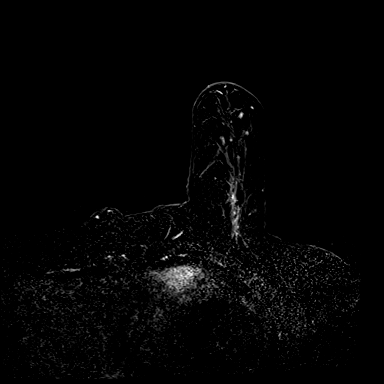
[im 48/144]
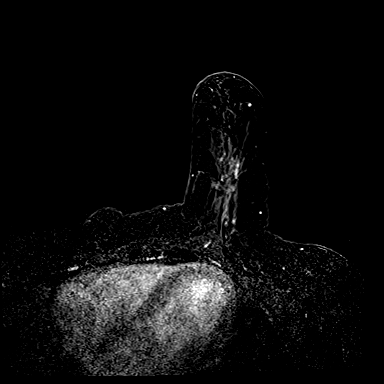
[im 72/144]
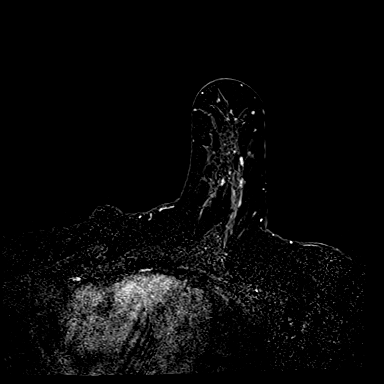
[im 96/144]
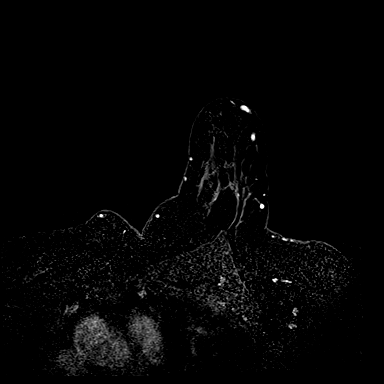
[im 120/144]
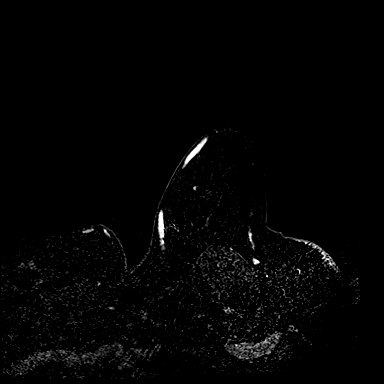
[im 144/144]
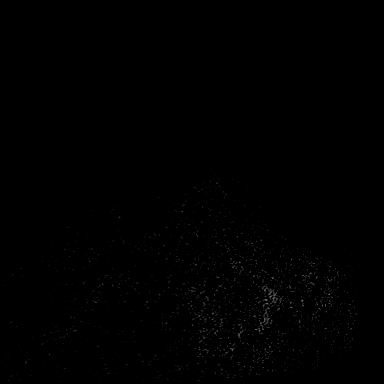

[34 of 48 positions shown; findings below may reference images not displayed]

Three-dimensional MR images were rendered by post-processing of the
original MR data on an independent workstation. The
three-dimensional MR images were interpreted, and findings are
reported in the following complete MRI report for this study. Three
dimensional images were evaluated at the independent DynaCad
workstation
FINDINGS: Breast composition: c. Heterogeneous fibroglandular tissue.

Background parenchymal enhancement: Moderate.

Left breast: No mass or abnormal enhancement.

Lymph nodes: No abnormal appearing lymph nodes.

Ancillary findings:  None.
IMPRESSION: The non-mass enhancement in the left breast was not present on
today's imaging. As result, a biopsy was not performed.

RECOMMENDATION:
Recommend six-month follow-up breast MRI to further assess the upper
inner left breast, the site of previously identified non mass
enhancement.

BI-RADS CATEGORY  2: Benign.

## 2020-11-23 MED ORDER — GADOBUTROL 1 MMOL/ML IV SOLN
10.0000 mL | Freq: Once | INTRAVENOUS | Status: AC | PRN
  Administered 2020-11-23: 10 mL via INTRAVENOUS

## 2020-11-24 ENCOUNTER — Encounter: Payer: Self-pay | Admitting: *Deleted

## 2020-11-24 ENCOUNTER — Inpatient Hospital Stay: Payer: BC Managed Care – PPO

## 2020-11-24 ENCOUNTER — Inpatient Hospital Stay: Payer: BC Managed Care – PPO | Admitting: Hematology

## 2020-11-24 ENCOUNTER — Encounter: Payer: Self-pay | Admitting: Hematology

## 2020-11-24 ENCOUNTER — Other Ambulatory Visit: Payer: Self-pay

## 2020-11-24 VITALS — BP 114/59 | HR 90 | Temp 98.8°F | Resp 18 | Ht 64.0 in | Wt 236.0 lb

## 2020-11-24 DIAGNOSIS — C50211 Malignant neoplasm of upper-inner quadrant of right female breast: Secondary | ICD-10-CM

## 2020-11-24 DIAGNOSIS — Z170 Estrogen receptor positive status [ER+]: Secondary | ICD-10-CM | POA: Diagnosis not present

## 2020-11-24 DIAGNOSIS — Z79899 Other long term (current) drug therapy: Secondary | ICD-10-CM | POA: Diagnosis not present

## 2020-11-24 DIAGNOSIS — G43909 Migraine, unspecified, not intractable, without status migrainosus: Secondary | ICD-10-CM | POA: Diagnosis not present

## 2020-11-24 DIAGNOSIS — D500 Iron deficiency anemia secondary to blood loss (chronic): Secondary | ICD-10-CM | POA: Diagnosis not present

## 2020-11-24 DIAGNOSIS — N920 Excessive and frequent menstruation with regular cycle: Secondary | ICD-10-CM | POA: Diagnosis not present

## 2020-11-24 DIAGNOSIS — Z79810 Long term (current) use of selective estrogen receptor modulators (SERMs): Secondary | ICD-10-CM | POA: Diagnosis not present

## 2020-11-24 LAB — COMPREHENSIVE METABOLIC PANEL
ALT: 10 U/L (ref 0–44)
AST: 14 U/L — ABNORMAL LOW (ref 15–41)
Albumin: 3.7 g/dL (ref 3.5–5.0)
Alkaline Phosphatase: 47 U/L (ref 38–126)
Anion gap: 6 (ref 5–15)
BUN: 8 mg/dL (ref 6–20)
CO2: 23 mmol/L (ref 22–32)
Calcium: 8.7 mg/dL — ABNORMAL LOW (ref 8.9–10.3)
Chloride: 109 mmol/L (ref 98–111)
Creatinine, Ser: 0.85 mg/dL (ref 0.44–1.00)
GFR, Estimated: >60 mL/min (ref >60)
Glucose, Bld: 88 mg/dL (ref 70–99)
Potassium: 3.6 mmol/L (ref 3.5–5.1)
Sodium: 138 mmol/L (ref 135–145)
Total Bilirubin: 0.7 mg/dL (ref 0.3–1.2)
Total Protein: 7.1 g/dL (ref 6.5–8.1)

## 2020-11-24 LAB — CBC WITH DIFFERENTIAL/PLATELET
Abs Immature Granulocytes: 0.01 10*3/uL (ref 0.00–0.07)
Basophils Absolute: 0.1 10*3/uL (ref 0.0–0.1)
Basophils Relative: 1 %
Eosinophils Absolute: 0.4 10*3/uL (ref 0.0–0.5)
Eosinophils Relative: 5 %
HCT: 35.6 % — ABNORMAL LOW (ref 36.0–46.0)
Hemoglobin: 11.1 g/dL — ABNORMAL LOW (ref 12.0–15.0)
Immature Granulocytes: 0 %
Lymphocytes Relative: 35 %
Lymphs Abs: 2.5 10*3/uL (ref 0.7–4.0)
MCH: 24 pg — ABNORMAL LOW (ref 26.0–34.0)
MCHC: 31.2 g/dL (ref 30.0–36.0)
MCV: 76.9 fL — ABNORMAL LOW (ref 80.0–100.0)
Monocytes Absolute: 0.4 10*3/uL (ref 0.1–1.0)
Monocytes Relative: 6 %
Neutro Abs: 3.9 10*3/uL (ref 1.7–7.7)
Neutrophils Relative %: 53 %
Platelets: 391 10*3/uL (ref 150–400)
RBC: 4.63 MIL/uL (ref 3.87–5.11)
RDW: 24.1 % — ABNORMAL HIGH (ref 11.5–15.5)
WBC: 7.3 10*3/uL (ref 4.0–10.5)
nRBC: 0 % (ref 0.0–0.2)

## 2020-11-24 NOTE — Progress Notes (Signed)
[facility]   [phone] Fax:(336) [phone]   Clinic Follow up Note   Patient Care Team: Kristen Loader, FNP as PCP - General (Family Medicine) Mauro Kaufmann, RN as Oncology Nurse Navigator Rockwell Germany, RN as Oncology Nurse Navigator Rolm Bookbinder, MD as Consulting Physician (General Surgery) Truitt Merle, MD as Consulting Physician (Hematology) Gery Pray, MD as Consulting Physician (Radiation Oncology)  Date of Service:  11/24/2020  CHIEF COMPLAINT: f/u of right breast cancer, anemia  ASSESSMENT & PLAN:  Connie Underwood is a 45 y.o. female with   1. Iron deficient anemia  -She has chronic anemia for many years, secondary to iron deficiency from menorrhagia -Hgb 9.0 with low MCV 71.7 on 10/18/20, ferritin was low  -She received three doses of IV Venofer 345m in 10/2020 and her anemia has improved.  She will receive one more dose anytime in the next 2 weeks.  2. Malignant neoplasm of upper-inner quadrant of right breast, invasive Ductal Carcinoma, pT2N0M0, Stage IB, ER+/PR+/HER2-, Grade 2  -she presented with a palpable right breast mass. Biopsy on 10/06/20 revealed IDC with lobular features, grade 2, ER+, PR+, Her2-. -Oncotype DX was obtained on the biopsy sample and the recurrence score of 19 predicts a risk of recurrence outside the breast over the next 9 years of 6%, if the patient's only systemic therapy is an antiestrogen for 5 years.  It also predicts no benefit from chemotherapy.  -breast MRI on 11/09/20 showed additional areas of concern in the bilateral breasts. When she returned for left breast biopsy, the area was no longer visible. She is planning for right mastectomy, so right breast biopsy was not necessary. -She is scheduled for right mastectomy on 12/25/20 with Dr. WDonne Hazel She is planning on reconstruction with implants. -she started tamoxifen on 10/18/20. Tolerating well with hot flashes. She will begin holding this one week  prior to her surgery. We will discuss restarting it back at her follow up visit a month out. -labs and f/u the week of 01/22/21.  3. Genetic testing 10/24/20 -negative with [facility] in [place]   4. Migraine  -on meds as needed      Plan -will schedule iv Venofer 3075monce in next two weeks -continue tamoxifen, will hold 1 week before surgery until next visit  -Proceed with right breast mastectomy with reconstruction on 12/25/20 -labs and f/u the week of 01/22/21   No problem-specific Assessment & Plan notes found for this encounter.   SUMMARY OF ONCOLOGIC HISTORY: Oncology History Overview Note  Cancer Staging Malignant neoplasm of upper-inner quadrant of right breast in female, estrogen receptor positive (HCBlandStaging form: Breast, AJCC 8th Edition - Clinical stage from 10/06/2020: Stage IB (cT2, cN0, cM0, G2, ER+, PR+, HER2-) - Signed by FeTruitt MerleMD on 10/17/2020 Stage prefix: Initial diagnosis Histologic grading system: 3 grade system    Malignant neoplasm of upper-inner quadrant of right breast in female, estrogen receptor positive ([place] 10/06/2020 Mammogram   DIGITAL DIAGNOSTIC BILATERAL MAMMOGRAM WITH TOMOSYNTHESIS AND CAD; ULTRASOUND RIGHT BREAST LIMITED  IMPRESSION: Two contiguous hypoechoic masses in the right breast- an irregular hypoechoic mass in the right breast at 12:30 3 cm from the nipple measuring 2.5 x 2.1 x 3.0 cm, and a hypoechoic heterogeneous mass at 12:30 2 cm from the nipple measuring 3.0 x 1.6 x 2.8 cm. Sonographic evaluation of the right axilla does not show any enlarged adenopathy.   10/06/2020 Pathology Results   Diagnosis Breast, right, needle core biopsy -  INVASIVE MAMMARY CARCINOMA WITH LOBULAR FEATURES - SEE COMMENT Microscopic Comment The biopsy material shows an infiltrative proliferation of cells with large vesicular nuclei with inconspicuous nucleoli, arranged linearly and in small clusters. Based on the biopsy, the carcinoma appears Nottingham grade  2 of 3 and measures 0.9 cm in greatest linear extent  E-cadherin is POSITIVE supporting a ductal origin.  PROGNOSTIC INDICATORS Results: IMMUNOHISTOCHEMICAL AND MORPHOMETRIC ANALYSIS PERFORMED MANUALLY The tumor cells are NEGATIVE for Her2 (1+). Estrogen Receptor: 80%, POSITIVE, STRONG STAINING INTENSITY Progesterone Receptor: 90%, POSITIVE, STRONG STAINING INTENSITY Proliferation Marker Ki67: 10%   10/06/2020 Cancer Staging   Staging form: Breast, AJCC 8th Edition - Clinical stage from 10/06/2020: Stage IB (cT2, cN0, cM0, G2, ER+, PR+, HER2-) - Signed by Truitt Merle, MD on 10/17/2020 Stage prefix: Initial diagnosis Histologic grading system: 3 grade system   10/06/2020 Oncotype testing   RS of 19, distant recurrence risk of 6%, chemotherapy not recommended   10/13/2020 Initial Diagnosis   Malignant neoplasm of upper-inner quadrant of right breast in female, estrogen receptor positive ([facility])   10/24/2020 Genetic Testing   Negative hereditary cancer genetic testing: no pathogenic variants detected in [facility] BRCAPlus Panel and [facility] CancerNext-Expanded +RNAinsight.  Variants of uncertain significance detected in [place] at p.Y2H (c.4T>C) and SUFU at  p.A138T (c.412G>A). The report dates are October 24, 2020 and October 27, 2020, respectively.    The BRCAplus panel offered by [employer] and includes sequencing and deletion/duplication analysis for the following 8 genes: ATM, BRCA1, BRCA2, CDH1, CHEK2, PALB2, PTEN, and TP53.  The CancerNext-Expanded gene panel offered by [facility] and includes sequencing, rearrangement, and RNA analysis for the following 77 genes: AIP, ALK, APC, ATM, AXIN2, BAP1, BARD1, BLM, BMPR1A, BRCA1, BRCA2, BRIP1, CDC73, CDH1, CDK4, CDKN1B, CDKN2A, CHEK2, CTNNA1, DICER1, FANCC, FH, FLCN, GALNT12, KIF1B, LZTR1, MAX, MEN1, MET, MLH1, MSH2, MSH3, MSH6, MUTYH, NBN, NF1, NF2, NTHL1, PALB2, PHOX2B, PMS2, POT1, PRKAR1A, PTCH1, PTEN, RAD51C, RAD51D, RB1, RECQL, RET, SDHA, SDHAF2, SDHB, SDHC,  SDHD, SMAD4, SMARCA4, SMARCB1, SMARCE1, STK11, SUFU, TMEM127, TP53, TSC1, TSC2, VHL and XRCC2 (sequencing and deletion/duplication); EGFR, EGLN1, HOXB13, KIT, MITF, PDGFRA, POLD1, and POLE (sequencing only); EPCAM and GREM1 (deletion/duplication only).    11/09/2020 Imaging   Breast MRI  IMPRESSION: 1. Known biopsy-proven invasive carcinoma within the upper inner quadrant of the RIGHT breast, measuring 3.6 cm, containing a biopsy clip. Contiguous linear non-mass enhancement extends superior-lateral to the spiculated mass, increasing overall craniocaudal dimension to 3 cm. 2. Additional irregular enhancing mass within the inner RIGHT breast, 3 o'clock axis region, at anterior depth, measuring 7 mm, with suspicious washout kinetics. MRI-guided biopsy is recommended to exclude multifocal disease. 3. Additional linear clumped non-mass enhancement within the upper inner quadrant of the LEFT breast, at posterior depth, measuring 1.8 cm extent, with suspicious washout kinetics. This may represent an intramammary lymph node. MRI-guided biopsy is recommended to exclude contralateral disease.      CURRENT THERAPY:  Tamoxifen, started neoadjuvantly on 10/18/20  INTERVAL HISTORY:  Connie Underwood is here for a follow up of breast cancer. She was last seen by me on 10/18/20 in consultation. She presents to the clinic alone. She notes she is doing well overall. She reports she has some good days and bad days. She overall feels the iron infusion helped. She notes her most recent period was at the beginning of this month.   All other systems were reviewed with the patient and are negative.  MEDICAL HISTORY:  Past Medical History:  Diagnosis  Date   Breast cancer [facility])    Family history of prostate cancer 10/18/2020   Hx of migraines    Seasonal allergic reaction     SURGICAL HISTORY: Past Surgical History:  Procedure Laterality Date   TUBAL LIGATION      I have reviewed the social history  and family history with the patient and they are unchanged from previous note.  ALLERGIES:  is allergic to tomato and pollen extract.  MEDICATIONS:  Current Outpatient Medications  Medication Sig Dispense Refill   benzonatate (TESSALON) 100 MG capsule Take 1-2 capsules (100-200 mg total) by mouth 3 (three) times daily as needed for cough. 60 capsule 0   cetirizine (ZYRTEC ALLERGY) 10 MG tablet Take 1 tablet (10 mg total) by mouth daily. 90 tablet 0   EMGALITY 120 MG/ML SOAJ Inject 1 mL into the skin every 30 (thirty) days.     escitalopram (LEXAPRO) 10 MG tablet Take 10 mg by mouth daily.     fluticasone (FLONASE) 50 MCG/ACT nasal spray Place 2 sprays into both nostrils daily. 16 g 12   naproxen (NAPROSYN) 500 MG tablet Take 1 tablet (500 mg total) by mouth 2 (two) times daily with a meal. 30 tablet 0   promethazine-dextromethorphan (PROMETHAZINE-DM) 6.25-15 MG/5ML syrup Take 5 mLs by mouth at bedtime as needed for cough. 100 mL 0   pseudoephedrine (SUDAFED) 60 MG tablet Take 1 tablet (60 mg total) by mouth every 8 (eight) hours as needed for congestion. 30 tablet 0   SUMAtriptan (IMITREX) 50 MG tablet      tamoxifen (NOLVADEX) 20 MG tablet Take 1 tablet (20 mg total) by mouth daily. 30 tablet 2   tiZANidine (ZANAFLEX) 4 MG tablet Take 1 tablet (4 mg total) by mouth at bedtime. 30 tablet 0   topiramate (TOPAMAX) 25 MG tablet      Vitamin D, Ergocalciferol, (DRISDOL) 1.25 MG (50000 UNIT) CAPS capsule Take 50,000 Units by mouth once a week.     No current facility-administered medications for this visit.    PHYSICAL EXAMINATION: ECOG PERFORMANCE STATUS: 0 - Asymptomatic  Vitals:   11/24/20 1425  BP: (!) 114/59  Pulse: 90  Resp: 18  Temp: 98.8 F (37.1 C)  SpO2: 97%   Wt Readings from Last 3 Encounters:  11/24/20 236 lb (107 kg)  10/18/20 242 lb (109.8 kg)  02/25/17 240 lb (108.9 kg)     GENERAL:alert, no distress and comfortable SKIN: skin color, texture, turgor are normal,  no rashes or significant lesions EYES: normal, Conjunctiva are pink and non-injected, sclera clear  NECK: supple, thyroid normal size, non-tender, without nodularity LYMPH:  no palpable lymphadenopathy in the cervical, axillary  LUNGS: clear to auscultation and percussion with normal breathing effort HEART: regular rate & rhythm and no murmurs and no lower extremity edema ABDOMEN:abdomen soft, non-tender and normal bowel sounds Musculoskeletal:no cyanosis of digits and no clubbing  NEURO: alert & oriented x 3 with fluent speech, no focal motor/sensory deficits BREAST: 3 x 3.5 cm right breast mass; No palpable nodules or adenopathy bilaterally.   LABORATORY DATA:  I have reviewed the data as listed CBC Latest Ref Rng & Units 11/24/2020 10/18/2020 09/27/2013  WBC 4.0 - 10.5 K/uL 7.3 7.5 8.7  Hemoglobin 12.0 - 15.0 g/dL 11.1(L) 9.0(L) 8.8(L)  Hematocrit 36.0 - 46.0 % 35.6(L) 30.2(L) 28.8(L)  Platelets 150 - 400 K/uL 391 480(H) 419(H)     CMP Latest Ref Rng & Units 11/24/2020 10/18/2020 09/27/2013  Glucose 70 - 99  mg/dL 88 101(H) 93  BUN 6 - 20 mg/dL 8 7 8   Creatinine 0.44 - 1.00 mg/dL 0.85 0.76 0.69  Sodium 135 - 145 mmol/L 138 140 139  Potassium 3.5 - 5.1 mmol/L 3.6 3.6 3.5(L)  Chloride 98 - 111 mmol/L 109 108 101  CO2 22 - 32 mmol/L 23 25 22   Calcium 8.9 - 10.3 mg/dL 8.7(L) 8.6(L) 8.9  Total Protein 6.5 - 8.1 g/dL 7.1 7.3 6.9  Total Bilirubin 0.3 - 1.2 mg/dL 0.7 0.5 0.3  Alkaline Phos 38 - 126 U/L 47 59 53  AST 15 - 41 U/L 14(L) 14(L) 16  ALT 0 - 44 U/L 10 8 8       RADIOGRAPHIC STUDIES: I have personally reviewed the radiological images as listed and agreed with the findings in the report. MR BREAST LEFT W WO CONTRAST INC CAD  Result Date: 11/23/2020 CLINICAL DATA:  The patient had an MRI November 09, 2020 for known breast cancer in the right breast. An MRI guided right breast biopsy for a separate abnormality was recommended, but the patient is scheduled for mastectomy. Linear  clumped non-mass enhancement in the upper inner left breast was identified and MRI guided biopsy was recommended. LABS:  None EXAM: MR OF THE LEFT BREAST WITH AND WITHOUT CONTRAST TECHNIQUE: Multiplanar multisequence MR images of the left breast were obtained prior to and following the intravenous administration of 10 ml of Gadavist. Three-dimensional MR images were rendered by post-processing of the original MR data on an independent workstation. The three-dimensional MR images were interpreted, and findings are reported in the following complete MRI report for this study. Three dimensional images were evaluated at the independent DynaCad workstation COMPARISON:  Previous exam(s). FINDINGS: Breast composition: c. Heterogeneous fibroglandular tissue. Background parenchymal enhancement: Moderate. Left breast: No mass or abnormal enhancement. Lymph nodes: No abnormal appearing lymph nodes. Ancillary findings:  None. IMPRESSION: The non-mass enhancement in the left breast was not present on today's imaging. As result, a biopsy was not performed. RECOMMENDATION: Recommend six-month follow-up breast MRI to further assess the upper inner left breast, the site of previously identified non mass enhancement. BI-RADS CATEGORY  2: Benign. Electronically Signed   By: Dorise Bullion III M.D.   On: 11/23/2020 10:07     No orders of the defined types were placed in this encounter.  All questions were answered. The patient knows to call the clinic with any problems, questions or concerns. No barriers to learning was detected. The total time spent in the appointment was 30 minutes.     Truitt Merle, MD 11/24/2020   I, Wilburn Mylar, am acting as scribe for Truitt Merle, MD.   I have reviewed the above documentation for accuracy and completeness, and I agree with the above.

## 2020-11-29 ENCOUNTER — Inpatient Hospital Stay: Payer: BC Managed Care – PPO

## 2020-12-07 ENCOUNTER — Ambulatory Visit (INDEPENDENT_AMBULATORY_CARE_PROVIDER_SITE_OTHER): Payer: BC Managed Care – PPO | Admitting: Surgical

## 2020-12-07 ENCOUNTER — Encounter: Payer: Self-pay | Admitting: Surgical

## 2020-12-07 ENCOUNTER — Other Ambulatory Visit: Payer: Self-pay

## 2020-12-07 DIAGNOSIS — C50211 Malignant neoplasm of upper-inner quadrant of right female breast: Secondary | ICD-10-CM

## 2020-12-07 DIAGNOSIS — Z170 Estrogen receptor positive status [ER+]: Secondary | ICD-10-CM

## 2020-12-07 MED ORDER — HYDROCODONE-ACETAMINOPHEN 5-325 MG PO TABS: 1.0000 | ORAL_TABLET | Freq: Four times a day (QID) | ORAL | 0 refills | Status: AC | PRN

## 2020-12-07 MED ORDER — ONDANSETRON HCL 4 MG PO TABS: 4.0000 mg | ORAL_TABLET | Freq: Three times a day (TID) | ORAL | 0 refills | Status: DC | PRN

## 2020-12-07 MED ORDER — SULFAMETHOXAZOLE-TRIMETHOPRIM 800-160 MG PO TABS: 1.0000 | ORAL_TABLET | Freq: Two times a day (BID) | ORAL | 0 refills | Status: AC

## 2020-12-07 NOTE — Progress Notes (Signed)
Patient ID: [name], female    DOB: 16-Jul-1975, 45 y.o.   MRN: [id number]  Chief Complaint  Patient presents with   Post-op Follow-up      ICD-10-CM   1. Malignant neoplasm of upper-inner quadrant of right breast in female, estrogen receptor positive (Connie Underwood)  C50.211    Z17.0        History of Present Illness: Connie Underwood is a 45 y.o.  female  with a history of right breast cancer.  She presents for preoperative evaluation for upcoming procedure, immediate right breast reconstruction placement of tissue expander and Flex HD, scheduled for 12/25/2020 with Dr. Claudia Desanctis after right mastectomy with axillary sentinel lymph node biopsy by Dr. Donne Hazel  The patient has not had problems with anesthesia.  Patient has history of DVT while pregnant 25 years ago.  No history of PE.  No family history of DVT/PE.  No family or personal history of bleeding or clotting disorders.  Patient is not currently taking any blood thinners.  No history of CVA/MI.  Patient was recently prescribed tamoxifen, but she has not started this and was recommended to hold 2 weeks prior to surgery by oncology.  Job: Works at a call center, desk job  PMH Significant for: History of migraines  Reports overall she has been feeling well lately, she does have a history of iron deficiency anemia and had an infusion.  She may have 1 prior to surgery but is unsure.  She does not endorse any dizziness, weakness, vision changes at this time.   Past Medical History: Allergies: Allergies  Allergen Reactions   Tomato     Hives   Pollen Extract Other (See Comments)    Seasonal allergies     Current Medications:  Current Outpatient Medications:    benzonatate (TESSALON) 100 MG capsule, Take 1-2 capsules (100-200 mg total) by mouth 3 (three) times daily as needed for cough., Disp: 60 capsule, Rfl: 0   cetirizine (ZYRTEC ALLERGY) 10 MG tablet, Take 1 tablet (10 mg total) by mouth daily., Disp: 90 tablet, Rfl:  0   EMGALITY 120 MG/ML SOAJ, Inject 1 mL into the skin every 30 (thirty) days., Disp: , Rfl:    escitalopram (LEXAPRO) 10 MG tablet, Take 10 mg by mouth daily., Disp: , Rfl:    fluticasone (FLONASE) 50 MCG/ACT nasal spray, Place 2 sprays into both nostrils daily., Disp: 16 g, Rfl: 12   HYDROcodone-acetaminophen (NORCO) 5-325 MG tablet, Take 1 tablet by mouth every 6 (six) hours as needed for up to 5 days for severe pain., Disp: 20 tablet, Rfl: 0   naproxen (NAPROSYN) 500 MG tablet, Take 1 tablet (500 mg total) by mouth 2 (two) times daily with a meal., Disp: 30 tablet, Rfl: 0   ondansetron (ZOFRAN) 4 MG tablet, Take 1 tablet (4 mg total) by mouth every 8 (eight) hours as needed for nausea or vomiting., Disp: 20 tablet, Rfl: 0   promethazine-dextromethorphan (PROMETHAZINE-DM) 6.25-15 MG/5ML syrup, Take 5 mLs by mouth at bedtime as needed for cough., Disp: 100 mL, Rfl: 0   pseudoephedrine (SUDAFED) 60 MG tablet, Take 1 tablet (60 mg total) by mouth every 8 (eight) hours as needed for congestion., Disp: 30 tablet, Rfl: 0   sulfamethoxazole-trimethoprim (BACTRIM DS) 800-160 MG tablet, Take 1 tablet by mouth 2 (two) times daily for 7 days., Disp: 14 tablet, Rfl: 0   SUMAtriptan (IMITREX) 50 MG tablet, , Disp: , Rfl:    tamoxifen (NOLVADEX) 20 MG tablet,  Take 1 tablet (20 mg total) by mouth daily., Disp: 30 tablet, Rfl: 2   tiZANidine (ZANAFLEX) 4 MG tablet, Take 1 tablet (4 mg total) by mouth at bedtime., Disp: 30 tablet, Rfl: 0   topiramate (TOPAMAX) 25 MG tablet, , Disp: , Rfl:    Vitamin D, Ergocalciferol, (DRISDOL) 1.25 MG (50000 UNIT) CAPS capsule, Take 50,000 Units by mouth once a week., Disp: , Rfl:   Past Medical Problems: Past Medical History:  Diagnosis Date   Breast cancer ([place])    Family history of prostate cancer 10/18/2020   Hx of migraines    Seasonal allergic reaction     Past Surgical History: Past Surgical History:  Procedure Laterality Date   TUBAL LIGATION      Social  History: Social History   Socioeconomic History   Marital status: Single    Spouse name: Not on file   Number of children: 4   Years of education: Not on file   Highest education level: Not on file  Occupational History   Not on file  Tobacco Use   Smoking status: Never   Smokeless tobacco: Never  Vaping Use   Vaping Use: Never used  Substance and Sexual Activity   Alcohol use: No   Drug use: No   Sexual activity: Not on file  Other Topics Concern   Not on file  Social History Narrative   Not on file   Social Determinants of Health   Financial Resource Strain: Medium Risk   Difficulty of Paying Living Expenses: Somewhat hard  Food Insecurity: Not on file  Transportation Needs: Unmet Transportation Needs   Lack of Transportation (Medical): Yes   Lack of Transportation (Non-Medical): No  Physical Activity: Not on file  Stress: Not on file  Social Connections: Not on file  Intimate Partner Violence: Not on file    Family History: Family History  Problem Relation Age of Onset   Prostate cancer Maternal Uncle        dx after 19   Cancer Maternal Uncle        unknown type dx after 50; prostate? lung?   Prostate cancer Paternal Uncle        3 paternal uncles with prostate cancer dx after 81   Prostate cancer Paternal Grandfather        dx after 43    Review of Systems: Review of Systems  Constitutional: Negative.   Respiratory: Negative.    Cardiovascular: Negative.   Gastrointestinal: Negative.   Neurological: Negative.    Physical Exam: Vital Signs LMP 11/10/2020 (Exact Date)   Physical Exam Constitutional:      General: Not in acute distress.    Appearance: Normal appearance. Not ill-appearing.  HENT:     Head: Normocephalic and atraumatic.  Eyes:     Pupils: Pupils are equal, round Neck:     Musculoskeletal: Normal range of motion.  Cardiovascular:     Rate and Rhythm: Normal rate    Pulses: Normal pulses.  Pulmonary:     Effort: Pulmonary  effort is normal. No respiratory distress.  Musculoskeletal: Normal range of motion.  Skin:    General: Skin is warm and dry.     Findings: No erythema or rash.  Neurological:     General: No focal deficit present.     Mental Status: Alert and oriented to person, place, and time. Mental status is at baseline.     Motor: No weakness.  Psychiatric:  Mood and Affect: Mood normal.        Behavior: Behavior normal.    Assessment/Plan: The patient is scheduled for immediate right breast reconstruction and placement of tissue expander Flex HD with Dr. Claudia Desanctis.  Risks, benefits, and alternatives of procedure discussed, questions answered and consent obtained.    Smoking Status: Non-smoker; Counseling Given?  N/A Last Mammogram: 10/06/2020  Caprini Score: 9, high; Risk Factors include: Age, BMI greater than 40, current breast cancer, history of DVT 25 years ago while pregnant, and length of planned surgery. Recommendation for mechanical prophylaxis with possible chemoprophylaxis. Encourage early ambulation.  Discussed chemoprophylaxis with Dr. Claudia Desanctis, at this time will hold off given the patient will be ambulatory day of surgery and has no other DVT history.  Pictures obtained: '@consult'$   Post-op Rx sent to pharmacy: Norco, Zofran and Bactrim  Patient was provided with the expander and General Surgical Risk consent document and Pain Medication Agreement prior to their appointment.  They had adequate time to read through the risk consent documents and Pain Medication Agreement. We also discussed them in person together during this preop appointment. All of their questions were answered to their satisfaction.  Recommended calling if they have any further questions.  Risk consent form and Pain Medication Agreement to be scanned into patient's chart.  The risks that can be encountered with and after placement of a breast expander placement were discussed and include the following but not limited to  these: bleeding, infection, delayed healing, anesthesia risks, skin sensation changes, injury to structures including nerves, blood vessels, and muscles which may be temporary or permanent, allergies to tape, suture materials and glues, blood products, topical preparations or injected agents, skin contour irregularities, skin discoloration and swelling, deep vein thrombosis, cardiac and pulmonary complications, pain, which may persist, fluid accumulation, wrinkling of the skin over the expander, changes in nipple or breast sensation, expander leakage or rupture, faulty position of the expander, persistent pain, formation of tight scar tissue around the expander (capsular contracture), possible need for revisional surgery or staged procedures.  Electronically signed by: Carola Rhine Aymen Widrig, PA-C 12/07/2020 2:42 PM

## 2020-12-20 ENCOUNTER — Encounter: Payer: Self-pay | Admitting: General Practice

## 2020-12-20 NOTE — Progress Notes (Signed)
Surgical Instructions    Your procedure is scheduled on Monday September 26th.  Report to [facility] Main Entrance "A" at 5:30 A.M., then check in with the Admitting office.  Call this number if you have problems the morning of surgery:  [phone]   If you have any questions prior to your surgery date call [phone]: Open Monday-Friday 8am-4pm    Remember:  Do not eat after midnight the night before your surgery  You may drink clear liquids until 4:30am the morning of your surgery.   Clear liquids allowed are: Water, Non-Citrus Juices (without pulp), Carbonated Beverages, Clear Tea, Black Coffee ONLY (NO MILK, CREAM OR POWDERED CREAMER of any kind), and Gatorade    Take these medicines the morning of surgery with A SIP OF WATER escitalopram (LEXAPRO) 10 MG tablet  IF NEEDED ondansetron (ZOFRAN) 4 MG tablet SUMAtriptan (IMITREX) 50 MG tablet   As of today, STOP taking any Aspirin (unless otherwise instructed by your surgeon) Aleve, Naproxen, Ibuprofen, Motrin, Advil, Goody's, BC's, all herbal medications, fish oil, and all vitamins.          Do not wear jewelry or makeup Do not wear lotions, powders, perfumes, or deodorant. Do not shave 48 hours prior to surgery.   Do not bring valuables to the hospital. DO Not wear nail polish, gel polish, artificial nails, or any other type of covering on natural nails including finger and toenails. If patients have artificial nails, gel coating, etc. that need to be removed by a nail salon please have this removed prior to surgery or surgery may need to be canceled/delayed if the surgeon/ anesthesia feels like the patient is unable to be adequately monitored.             [facility] is not responsible for any belongings or valuables.  Do NOT Smoke (Tobacco/Vaping)  24 hours prior to your procedure If you use a CPAP at night, you may bring your mask for your overnight stay.   Contacts, glasses, dentures or bridgework may not be worn into  surgery, please bring cases for these belongings   For patients admitted to the hospital, discharge time will be determined by your treatment team.   Patients discharged the day of surgery will not be allowed to drive home, and someone needs to stay with them for 24 hours.  NO VISITORS WILL BE ALLOWED IN PRE-OP WHERE PATIENTS GET READY FOR SURGERY.  ONLY 1 SUPPORT PERSON MAY BE PRESENT WHILE YOU ARE IN SURGERY.  IF YOU ARE TO BE ADMITTED, ONCE YOU ARE IN YOUR ROOM YOU WILL BE ALLOWED TWO (2) VISITORS.  Minor children may have two parents present. Special consideration for safety and communication needs will be reviewed on a case by case basis.  Special instructions:    Oral Hygiene is also important to reduce your risk of infection.  Remember - BRUSH YOUR TEETH THE MORNING OF SURGERY WITH YOUR REGULAR TOOTHPASTE   [facility]- Preparing For Surgery  Before surgery, you can play an important role. Because skin is not sterile, your skin needs to be as free of germs as possible. You can reduce the number of germs on your skin by washing with CHG (chlorahexidine gluconate) Soap before surgery.  CHG is an antiseptic cleaner which kills germs and bonds with the skin to continue killing germs even after washing.     Please do not use if you have an allergy to CHG or antibacterial soaps. If your skin becomes reddened/irritated stop using  the CHG.  Do not shave (including legs and underarms) for at least 48 hours prior to first CHG shower. It is OK to shave your face.  Please follow these instructions carefully.     Shower the NIGHT BEFORE SURGERY and the MORNING OF SURGERY with CHG Soap.   If you chose to wash your hair, wash your hair first as usual with your normal shampoo. After you shampoo, rinse your hair and body thoroughly to remove the shampoo.  Then [employer] and genitals (private parts) with your normal soap and rinse thoroughly to remove soap.  After that Use CHG Soap as you would any  other liquid soap. You can apply CHG directly to the skin and wash gently with a scrungie or a clean washcloth.   Apply the CHG Soap to your body ONLY FROM THE NECK DOWN.  Do not use on open wounds or open sores. Avoid contact with your eyes, ears, mouth and genitals (private parts). Wash Face and genitals (private parts)  with your normal soap.   Wash thoroughly, paying special attention to the area where your surgery will be performed.  Thoroughly rinse your body with warm water from the neck down.  DO NOT shower/wash with your normal soap after using and rinsing off the CHG Soap.  Pat yourself dry with a CLEAN TOWEL.  Wear CLEAN PAJAMAS to bed the night before surgery  Place CLEAN SHEETS on your bed the night before your surgery  DO NOT SLEEP WITH PETS.   Day of Surgery:  Take a shower with CHG soap. Wear Clean/Comfortable clothing the morning of surgery Do not apply any deodorants/lotions.   Remember to brush your teeth WITH YOUR REGULAR TOOTHPASTE.   Please read over the following fact sheets that you were given.

## 2020-12-20 NOTE — Progress Notes (Signed)
[place] CSW Progress Notes  Message from [employer], Estate manager/land agent.  Patient is facing eviction and has had car repossessed.  Nyra Capes will assist w [employer], asking CSW to reach out w additional resources.  Tried to call patient, VM not set up.  Sent patient email w several financial aid resources and encouragement to call us to get help applying for these options.  Edwyna Shell, LCSW Clinical Social Worker Phone:  [phone]

## 2020-12-21 ENCOUNTER — Encounter: Payer: Self-pay | Admitting: General Practice

## 2020-12-21 ENCOUNTER — Encounter (HOSPITAL_COMMUNITY)
Admission: RE | Admit: 2020-12-21 | Discharge: 2020-12-21 | Disposition: A | Discharge status: Home / Self Care | Payer: BC Managed Care – PPO | Source: Ambulatory Visit | Attending: General Surgery | Admitting: General Surgery

## 2020-12-21 ENCOUNTER — Encounter: Payer: Self-pay | Admitting: Hematology

## 2020-12-21 ENCOUNTER — Encounter (HOSPITAL_COMMUNITY): Payer: Self-pay

## 2020-12-21 ENCOUNTER — Other Ambulatory Visit: Payer: Self-pay

## 2020-12-21 DIAGNOSIS — Z01818 Encounter for other preprocedural examination: Secondary | ICD-10-CM | POA: Diagnosis not present

## 2020-12-21 DIAGNOSIS — Z20822 Contact with and (suspected) exposure to covid-19: Secondary | ICD-10-CM | POA: Insufficient documentation

## 2020-12-21 HISTORY — DX: Anemia, unspecified: D64.9

## 2020-12-21 HISTORY — DX: Depression, unspecified: F32.A

## 2020-12-21 HISTORY — DX: Prediabetes: R73.03

## 2020-12-21 HISTORY — DX: Anxiety disorder, unspecified: F41.9

## 2020-12-21 LAB — BASIC METABOLIC PANEL
Anion gap: 8 (ref 5–15)
BUN: 5 mg/dL — ABNORMAL LOW (ref 6–20)
CO2: 23 mmol/L (ref 22–32)
Calcium: 8.6 mg/dL — ABNORMAL LOW (ref 8.9–10.3)
Chloride: 108 mmol/L (ref 98–111)
Creatinine, Ser: 0.87 mg/dL (ref 0.44–1.00)
GFR, Estimated: >60 mL/min (ref >60)
Glucose, Bld: 107 mg/dL — ABNORMAL HIGH (ref 70–99)
Potassium: 3.3 mmol/L — ABNORMAL LOW (ref 3.5–5.1)
Sodium: 139 mmol/L (ref 135–145)

## 2020-12-21 LAB — CBC
HCT: 35.6 % — ABNORMAL LOW (ref 36.0–46.0)
Hemoglobin: 11.3 g/dL — ABNORMAL LOW (ref 12.0–15.0)
MCH: 25.7 pg — ABNORMAL LOW (ref 26.0–34.0)
MCHC: 31.7 g/dL (ref 30.0–36.0)
MCV: 81.1 fL (ref 80.0–100.0)
Platelets: 361 10*3/uL (ref 150–400)
RBC: 4.39 MIL/uL (ref 3.87–5.11)
RDW: 23.3 % — ABNORMAL HIGH (ref 11.5–15.5)
WBC: 6.8 10*3/uL (ref 4.0–10.5)
nRBC: 0 % (ref 0.0–0.2)

## 2020-12-21 LAB — HEMOGLOBIN A1C
Hgb A1c MFr Bld: 5.4 % (ref 4.8–5.6)
Mean Plasma Glucose: 108 mg/dL

## 2020-12-21 LAB — SARS CORONAVIRUS 2 (TAT 6-24 HRS): SARS Coronavirus 2: NEGATIVE

## 2020-12-21 LAB — GLUCOSE, CAPILLARY: Glucose-Capillary: 109 mg/dL — ABNORMAL HIGH (ref 70–99)

## 2020-12-21 NOTE — Progress Notes (Signed)
Patient returned my call.  Re-introduced myself as Arboriculturist and offered available resources. Discussed one-tine $1000 Radio broadcast assistant to assist with personal expenses while going through treatment. Patient will be approved for grant and we will further discuss sometime after surgery as to how funds would best be utilized due to patient may have some transition with housing.  Encouraged patient to contact resources provided by Education officer, museum.   She has my card for any additional financial questions or concerns.

## 2020-12-21 NOTE — Progress Notes (Addendum)
[facility] [place] Work  Initial Assessment   Connie Underwood is a 45 y.o. year old female contacted by phone. Clinical Social Work was referred by Psychologist, clinical for assessment of psychosocial needs.   SDOH (Social Determinants of Health) assessments performed: Yes   Distress Screen completed: Yes ONCBCN DISTRESS SCREENING 10/18/2020  Screening Type Initial Screening  Practical problem type Housing;Transportation  Emotional problem type Depression;Nervousness/Anxiety;Adjusting to illness      Family/Social Information:  Housing Arrangement: patient lives with 2 children (58 daughter and son 38).  Daughter is working in [employer], son is unemployed.  Daughter is helping pay rent.  She has received an eviction notice and was supposed to go to court on 9/12.   Is now facing eviction immediately after surgery.   Family members/support persons in your life? Family Transportation concerns: yes, does not have a car, no gas, depends onfriends to get to appointments  Employment: Out on work excuse. Income source: Employment and Supported by [employer] and [employer]; she anticipates being out of work for 6 weeks.   Financial concerns: Yes, due to illness and/or loss of work during treatment Type of concern: Utilities, Rent/ mortgage, Transportation, Medical bills, and [employer] access concerns: yes, was denied Physicist, medical as she was working and is over income.  Mother is helping when she can.   Religious or spiritual practice: did not assess Medication Concerns: yes, it will be a problem when she is not working after surgery.   Services Currently in place:  none at this time; referral for [facility] Transportation sent again today as pt says she has not heard from them to date.  Has no car.  Coping/ Adjustment to diagnosis: Patient understands treatment plan and what happens next? yes, diagnosed w invasive ductal carcinoma, Stage 29 September 2020.  On tamoxifan and will have mastectomy 12/25/20.   She will be out of work for 6 weeks as a result.  Concerns about diagnosis and/or treatment: Losing my job, How I will pay for the services I need, and eviction/losing housing as result of no income Patient reported stressors: Housing, Insurance underwriter, Work/ school, Publishing rights manager, Transport planner, Haematologist, Anxiety, and Adjusting to my illness Hopes and priorities: wants to address eviction and remain in her home, then return to work Current coping skills/ strengths: Average or above average intelligence , Capable of independent living , Communication skills , and Supportive family/friends     SUMMARY: Current SDOH Barriers:  Financial constraints related to loss of income while she is out of work recovering from surgery.    Clinical Social Work Clinical Goal(s):  explore community resource options for unmet needs related YD:XAJOINOMV stress, medical bills  patient will work with SW to address concerns related to see above  Interventions: Discussed common feeling and emotions when being diagnosed with cancer, and the importance of support during treatment Informed patient of the support team roles and support services at [facility] Provided [address] contact information and encouraged patient to call with any questions or concerns Referred patient to [facility] for Housing and [employer] Studies/Legal Aid, [employer], [employer], [place]   Follow Up Plan: CSW will follow-up with patient by phone  Patient verbalizes understanding of plan: Yes    Beverely Pace , [place], [place] Worker Phone:  [phone]

## 2020-12-21 NOTE — Progress Notes (Addendum)
PCP - Jillyn Ledger, [place] Cardiologist - denies  PPM/ICD - denies Device Orders - n/a Rep Notified - n/a  Chest x-ray - n/a EKG - 12/21/2020 Stress Test - denies ECHO - denies Cardiac Cath - denies  Sleep Study - denies CPAP - n/a  Fasting Blood Sugar - 80 - 100 CBG today - 109 Checks CBG - ones a day A1C - done in PAT 12/21/2020  Blood Thinner Instructions: n/a  Aspirin Instructions: Patient was instructed: As of today, STOP taking any Aspirin (unless otherwise instructed by your surgeon) Aleve, Naproxen, Ibuprofen, Motrin, Advil, Goody's, BC's, all herbal medications, fish oil, and all vitamins  ERAS Protcol - yes PRE-SURGERY Ensure - yes  COVID TEST- yes in PAT  Anesthesia review: no  Patient denies shortness of breath, fever, cough and chest pain at PAT appointment   All instructions explained to the patient, with a verbal understanding of the material. Patient agrees to go over the instructions while at home for a better understanding. Patient also instructed to self quarantine after being tested for COVID-19. The opportunity to ask questions was provided.

## 2020-12-21 NOTE — Progress Notes (Signed)
[place] CSW Progress Notes  Email from [employer] and [employer] program - they will reach out and assist as they can w possible eviction.  Email from [employer], they are reaching out to client to offer help.  Edwyna Shell, LCSW Clinical Social Worker Phone:  [phone]

## 2020-12-21 NOTE — Progress Notes (Addendum)
Surgical Instructions    Your procedure is scheduled on Monday September 26th.  Report to [facility] Main Entrance "A" at 5:30 A.M., then check in with the Admitting office.  Call this number if you have problems the morning of surgery:  [phone]   If you have any questions prior to your surgery date call [phone]: Open Monday-Friday 8am-4pm    Remember:  Do not eat after midnight the night before your surgery  You may drink clear liquids until 4:30am the morning of your surgery.   Clear liquids allowed are: Water, Non-Citrus Juices (without pulp), Carbonated Beverages, Clear Tea, Black Coffee ONLY (NO MILK, CREAM OR POWDERED CREAMER of any kind), and Gatorade  Patient Instructions  The day of surgery (if you have diabetes): Drink ONE (1) 12 oz G2 given to you in your pre admission testing appointment by 04:30 the morning of surgery. Drink in one sitting. Do not sip.  This drink was given to you during your hospital  pre-op appointment visit.  Nothing else to drink after completing the  12 oz bottle of G2.         If you have questions, please contact your surgeon's office.         If you have questions, please contact your surgeon's office.     Take these medicines the morning of surgery with A SIP OF WATER escitalopram (LEXAPRO) 10 MG tablet  IF NEEDED ondansetron (ZOFRAN) 4 MG tablet SUMAtriptan (IMITREX) 50 MG tablet   As of today, STOP taking any Aspirin (unless otherwise instructed by your surgeon) Aleve, Naproxen, Ibuprofen, Motrin, Advil, Goody's, BC's, all herbal medications, fish oil, and all vitamins.          Do not wear jewelry or makeup Do not wear lotions, powders, perfumes, or deodorant. Do not shave 48 hours prior to surgery.   Do not bring valuables to the hospital. DO Not wear nail polish, gel polish, artificial nails, or any other type of covering on natural nails including finger and toenails. If patients have artificial nails, gel coating,  etc. that need to be removed by a nail salon please have this removed prior to surgery or surgery may need to be canceled/delayed if the surgeon/ anesthesia feels like the patient is unable to be adequately monitored.             [facility] is not responsible for any belongings or valuables.  Do NOT Smoke (Tobacco/Vaping)  24 hours prior to your procedure If you use a CPAP at night, you may bring your mask for your overnight stay.   Contacts, glasses, dentures or bridgework may not be worn into surgery, please bring cases for these belongings   For patients admitted to the hospital, discharge time will be determined by your treatment team.   Patients discharged the day of surgery will not be allowed to drive home, and someone needs to stay with them for 24 hours.  NO VISITORS WILL BE ALLOWED IN PRE-OP WHERE PATIENTS GET READY FOR SURGERY.  ONLY 1 SUPPORT PERSON MAY BE PRESENT WHILE YOU ARE IN SURGERY.  IF YOU ARE TO BE ADMITTED, ONCE YOU ARE IN YOUR ROOM YOU WILL BE ALLOWED TWO (2) VISITORS.  Minor children may have two parents present. Special consideration for safety and communication needs will be reviewed on a case by case basis.  Special instructions:    Oral Hygiene is also important to reduce your risk of infection.  Remember - BRUSH YOUR TEETH THE MORNING  OF SURGERY WITH YOUR REGULAR TOOTHPASTE   [facility]- Preparing For Surgery  Before surgery, you can play an important role. Because skin is not sterile, your skin needs to be as free of germs as possible. You can reduce the number of germs on your skin by washing with CHG (chlorahexidine gluconate) Soap before surgery.  CHG is an antiseptic cleaner which kills germs and bonds with the skin to continue killing germs even after washing.     Please do not use if you have an allergy to CHG or antibacterial soaps. If your skin becomes reddened/irritated stop using the CHG.  Do not shave (including legs and underarms) for at least 48  hours prior to first CHG shower. It is OK to shave your face.  Please follow these instructions carefully.     Shower the NIGHT BEFORE SURGERY and the MORNING OF SURGERY with CHG Soap.   If you chose to wash your hair, wash your hair first as usual with your normal shampoo. After you shampoo, rinse your hair and body thoroughly to remove the shampoo.  Then [employer] and genitals (private parts) with your normal soap and rinse thoroughly to remove soap.  After that Use CHG Soap as you would any other liquid soap. You can apply CHG directly to the skin and wash gently with a scrungie or a clean washcloth.   Apply the CHG Soap to your body ONLY FROM THE NECK DOWN.  Do not use on open wounds or open sores. Avoid contact with your eyes, ears, mouth and genitals (private parts). Wash Face and genitals (private parts)  with your normal soap.   Wash thoroughly, paying special attention to the area where your surgery will be performed.  Thoroughly rinse your body with warm water from the neck down.  DO NOT shower/wash with your normal soap after using and rinsing off the CHG Soap.  Pat yourself dry with a CLEAN TOWEL.  Wear CLEAN PAJAMAS to bed the night before surgery  Place CLEAN SHEETS on your bed the night before your surgery  DO NOT SLEEP WITH PETS.   Day of Surgery:  Take a shower with CHG soap. Wear Clean/Comfortable clothing the morning of surgery Do not apply any deodorants/lotions.   Remember to brush your teeth WITH YOUR REGULAR TOOTHPASTE.   Please read over the following fact sheets that you were given.

## 2020-12-22 ENCOUNTER — Telehealth: Payer: Self-pay | Admitting: General Practice

## 2020-12-22 NOTE — Telephone Encounter (Signed)
[facility] CSW Progress Notes  Call to patient to check in.  She went to [employer] this morning, agency is reaching out to landlord to advocate on her behalf.  She also heard from [employer], they are standing by to assist if eviction moves forward.  Her application for [employer] was submitted yesterday, paperwork for [employer] in [place] is being mailed to her.  She is asked to contact CSW after she feels better post surgery to complete those applications.  Edwyna Shell, LCSW Clinical Social Worker Phone:  [phone]

## 2020-12-24 NOTE — Anesthesia Preprocedure Evaluation (Addendum)
Anesthesia Evaluation  Patient identified by MRN, date of birth, ID band Patient awake    Reviewed: Allergy & Precautions, NPO status , Patient's Chart, lab work & pertinent test results  Airway Mallampati: II  TM Distance: >3 FB     Dental   Pulmonary    breath sounds clear to auscultation       Cardiovascular negative cardio ROS   Rhythm:Regular Rate:Normal     Neuro/Psych Anxiety    GI/Hepatic negative GI ROS, Neg liver ROS,   Endo/Other    Renal/GU negative Renal ROS     Musculoskeletal   Abdominal   Peds  Hematology   Anesthesia Other Findings   Reproductive/Obstetrics                             Anesthesia Physical Anesthesia Plan  ASA: 2  Anesthesia Plan: General   Post-op Pain Management:    Induction: Intravenous  PONV Risk Score and Plan: 3 and Treatment may vary due to age or medical condition, Ondansetron, Dexamethasone and Midazolam  Airway Management Planned: Oral ETT  Additional Equipment:   Intra-op Plan:   Post-operative Plan:   Informed Consent: I have reviewed the patients History and Physical, chart, labs and discussed the procedure including the risks, benefits and alternatives for the proposed anesthesia with the patient or authorized representative who has indicated his/her understanding and acceptance.     Dental advisory given  Plan Discussed with: Anesthesiologist and CRNA  Anesthesia Plan Comments:        Anesthesia Quick Evaluation

## 2020-12-25 ENCOUNTER — Other Ambulatory Visit: Payer: Self-pay

## 2020-12-25 ENCOUNTER — Encounter (HOSPITAL_COMMUNITY): Payer: Self-pay | Admitting: General Surgery

## 2020-12-25 ENCOUNTER — Ambulatory Visit (HOSPITAL_COMMUNITY): Payer: BC Managed Care – PPO

## 2020-12-25 ENCOUNTER — Observation Stay (HOSPITAL_COMMUNITY)
Admission: RE | Admit: 2020-12-25 | Discharge: 2020-12-26 | Disposition: A | Discharge status: Home / Self Care | Payer: BC Managed Care – PPO | Attending: Plastic Surgery | Admitting: Plastic Surgery

## 2020-12-25 ENCOUNTER — Encounter (HOSPITAL_COMMUNITY)
Admission: RE | Disposition: A | Discharge status: Home / Self Care | Payer: Self-pay | Source: Home / Self Care | Attending: Plastic Surgery

## 2020-12-25 ENCOUNTER — Ambulatory Visit (HOSPITAL_COMMUNITY): Payer: BC Managed Care – PPO | Admitting: Anesthesiology

## 2020-12-25 ENCOUNTER — Telehealth: Payer: Self-pay

## 2020-12-25 DIAGNOSIS — C50211 Malignant neoplasm of upper-inner quadrant of right female breast: Principal | ICD-10-CM | POA: Insufficient documentation

## 2020-12-25 DIAGNOSIS — D500 Iron deficiency anemia secondary to blood loss (chronic): Secondary | ICD-10-CM | POA: Diagnosis not present

## 2020-12-25 DIAGNOSIS — C50911 Malignant neoplasm of unspecified site of right female breast: Secondary | ICD-10-CM | POA: Diagnosis not present

## 2020-12-25 DIAGNOSIS — R7309 Other abnormal glucose: Secondary | ICD-10-CM | POA: Diagnosis not present

## 2020-12-25 DIAGNOSIS — Z170 Estrogen receptor positive status [ER+]: Secondary | ICD-10-CM | POA: Diagnosis not present

## 2020-12-25 DIAGNOSIS — G8918 Other acute postprocedural pain: Secondary | ICD-10-CM | POA: Diagnosis not present

## 2020-12-25 DIAGNOSIS — C773 Secondary and unspecified malignant neoplasm of axilla and upper limb lymph nodes: Secondary | ICD-10-CM | POA: Diagnosis not present

## 2020-12-25 DIAGNOSIS — D241 Benign neoplasm of right breast: Secondary | ICD-10-CM | POA: Diagnosis not present

## 2020-12-25 DIAGNOSIS — N62 Hypertrophy of breast: Secondary | ICD-10-CM | POA: Diagnosis not present

## 2020-12-25 HISTORY — PX: BREAST RECONSTRUCTION WITH PLACEMENT OF TISSUE EXPANDER AND FLEX HD (ACELLULAR HYDRATED DERMIS): SHX6295

## 2020-12-25 HISTORY — PX: MASTECTOMY W/ SENTINEL NODE BIOPSY: SHX2001

## 2020-12-25 LAB — GLUCOSE, CAPILLARY: Glucose-Capillary: 109 mg/dL — ABNORMAL HIGH (ref 70–99)

## 2020-12-25 LAB — POCT PREGNANCY, URINE: Preg Test, Ur: NEGATIVE

## 2020-12-25 SURGERY — MASTECTOMY WITH SENTINEL LYMPH NODE BIOPSY
Anesthesia: General | Site: Breast | Laterality: Right

## 2020-12-25 MED ORDER — SUMATRIPTAN SUCCINATE 50 MG PO TABS
50.0000 mg | ORAL_TABLET | ORAL | Status: DC | PRN
  Filled 2020-12-25: qty 1

## 2020-12-25 MED ORDER — CHLORHEXIDINE GLUCONATE CLOTH 2 % EX PADS: 6.0000 | MEDICATED_PAD | Freq: Once | CUTANEOUS | Status: DC

## 2020-12-25 MED ORDER — ONDANSETRON HCL 4 MG/2ML IJ SOLN
4.0000 mg | Freq: Four times a day (QID) | INTRAMUSCULAR | Status: DC | PRN
Start: 2020-12-25 — End: 2020-12-26

## 2020-12-25 MED ORDER — HYDROCODONE-ACETAMINOPHEN 5-325 MG PO TABS
1.0000 | ORAL_TABLET | ORAL | Status: DC | PRN
  Administered 2020-12-25 – 2020-12-26 (×2): 2 via ORAL
  Filled 2020-12-25 (×2): qty 2

## 2020-12-25 MED ORDER — IBUPROFEN 600 MG PO TABS: 600.0000 mg | ORAL_TABLET | Freq: Four times a day (QID) | ORAL | Status: DC | PRN

## 2020-12-25 MED ORDER — PROPOFOL 10 MG/ML IV BOLUS
INTRAVENOUS | Status: AC
  Filled 2020-12-25: qty 20

## 2020-12-25 MED ORDER — ACETAMINOPHEN 325 MG PO TABS: 650.0000 mg | ORAL_TABLET | Freq: Four times a day (QID) | ORAL | Status: DC | PRN

## 2020-12-25 MED ORDER — MORPHINE SULFATE (PF) 2 MG/ML IV SOLN
1.0000 mg | INTRAVENOUS | Status: DC | PRN
  Administered 2020-12-25: 1 mg via INTRAVENOUS
  Filled 2020-12-25: qty 1

## 2020-12-25 MED ORDER — LACTATED RINGERS IV SOLN: INTRAVENOUS | Status: DC

## 2020-12-25 MED ORDER — LIDOCAINE 2% (20 MG/ML) 5 ML SYRINGE
INTRAMUSCULAR | Status: DC | PRN
  Administered 2020-12-25: 30 mg via INTRAVENOUS

## 2020-12-25 MED ORDER — SODIUM CHLORIDE 0.9 % IV SOLN
INTRAVENOUS | Status: DC | PRN
  Administered 2020-12-25 (×2): 40 ug via INTRAVENOUS

## 2020-12-25 MED ORDER — PHENYLEPHRINE 40 MCG/ML (10ML) SYRINGE FOR IV PUSH (FOR BLOOD PRESSURE SUPPORT)
PREFILLED_SYRINGE | INTRAVENOUS | Status: DC | PRN
  Administered 2020-12-25 (×2): 40 ug via INTRAVENOUS

## 2020-12-25 MED ORDER — ORAL CARE MOUTH RINSE: 15.0000 mL | Freq: Once | OROMUCOSAL | Status: AC

## 2020-12-25 MED ORDER — KETOROLAC TROMETHAMINE 15 MG/ML IJ SOLN
INTRAMUSCULAR | Status: AC
  Administered 2020-12-25: 15 mg via INTRAVENOUS
  Filled 2020-12-25: qty 1

## 2020-12-25 MED ORDER — ACETAMINOPHEN 500 MG PO TABS
1000.0000 mg | ORAL_TABLET | ORAL | Status: AC
  Administered 2020-12-25: 1000 mg via ORAL

## 2020-12-25 MED ORDER — 0.9 % SODIUM CHLORIDE (POUR BTL) OPTIME
TOPICAL | Status: DC | PRN
  Administered 2020-12-25: 200 mL

## 2020-12-25 MED ORDER — INDOCYANINE GREEN 25 MG IV SOLR
INTRAVENOUS | Status: DC | PRN
  Administered 2020-12-25: 5 mg via INTRAVENOUS

## 2020-12-25 MED ORDER — SODIUM CHLORIDE 0.9 % IV SOLN
INTRAVENOUS | Status: DC | PRN
  Administered 2020-12-25: 500 mL

## 2020-12-25 MED ORDER — PROPOFOL 10 MG/ML IV BOLUS
INTRAVENOUS | Status: DC | PRN
  Administered 2020-12-25: 200 mg via INTRAVENOUS

## 2020-12-25 MED ORDER — SULFAMETHOXAZOLE-TRIMETHOPRIM 800-160 MG PO TABS
1.0000 | ORAL_TABLET | Freq: Two times a day (BID) | ORAL | Status: DC
  Administered 2020-12-25: 1 via ORAL
  Filled 2020-12-25: qty 1

## 2020-12-25 MED ORDER — METHOCARBAMOL 500 MG PO TABS: 500.0000 mg | ORAL_TABLET | Freq: Four times a day (QID) | ORAL | Status: DC | PRN

## 2020-12-25 MED ORDER — BUPIVACAINE HCL (PF) 0.25 % IJ SOLN
INTRAMUSCULAR | Status: AC
  Filled 2020-12-25: qty 30

## 2020-12-25 MED ORDER — ROCURONIUM BROMIDE 100 MG/10ML IV SOLN
INTRAVENOUS | Status: DC | PRN
  Administered 2020-12-25: 50 mg via INTRAVENOUS

## 2020-12-25 MED ORDER — FENTANYL CITRATE (PF) 100 MCG/2ML IJ SOLN
INTRAMUSCULAR | Status: AC
  Filled 2020-12-25: qty 2

## 2020-12-25 MED ORDER — LACTATED RINGERS IV SOLN: INTRAVENOUS | Status: DC | PRN

## 2020-12-25 MED ORDER — MIDAZOLAM HCL 2 MG/2ML IJ SOLN
INTRAMUSCULAR | Status: AC
  Filled 2020-12-25: qty 2

## 2020-12-25 MED ORDER — ENSURE PRE-SURGERY PO LIQD: 296.0000 mL | Freq: Once | ORAL | Status: DC

## 2020-12-25 MED ORDER — FENTANYL CITRATE (PF) 250 MCG/5ML IJ SOLN
INTRAMUSCULAR | Status: AC
  Filled 2020-12-25: qty 5

## 2020-12-25 MED ORDER — POLYETHYLENE GLYCOL 3350 17 G PO PACK: 17.0000 g | PACK | Freq: Every day | ORAL | Status: DC | PRN

## 2020-12-25 MED ORDER — ACETAMINOPHEN 500 MG PO TABS
ORAL_TABLET | ORAL | Status: AC
  Filled 2020-12-25: qty 2

## 2020-12-25 MED ORDER — MIDAZOLAM HCL 5 MG/5ML IJ SOLN
INTRAMUSCULAR | Status: DC | PRN
  Administered 2020-12-25: 2 mg via INTRAVENOUS

## 2020-12-25 MED ORDER — ONDANSETRON 4 MG PO TBDP: 4.0000 mg | ORAL_TABLET | Freq: Four times a day (QID) | ORAL | Status: DC | PRN

## 2020-12-25 MED ORDER — CHLORHEXIDINE GLUCONATE 0.12 % MT SOLN
15.0000 mL | Freq: Once | OROMUCOSAL | Status: AC
  Administered 2020-12-25: 15 mL via OROMUCOSAL
  Filled 2020-12-25: qty 15

## 2020-12-25 MED ORDER — DEXAMETHASONE SODIUM PHOSPHATE 10 MG/ML IJ SOLN
INTRAMUSCULAR | Status: DC | PRN
  Administered 2020-12-25: 5 mg via INTRAVENOUS

## 2020-12-25 MED ORDER — FENTANYL CITRATE (PF) 100 MCG/2ML IJ SOLN
25.0000 ug | INTRAMUSCULAR | Status: DC | PRN
  Administered 2020-12-25 (×2): 50 ug via INTRAVENOUS

## 2020-12-25 MED ORDER — FENTANYL CITRATE (PF) 100 MCG/2ML IJ SOLN: 25.0000 ug | INTRAMUSCULAR | Status: DC | PRN

## 2020-12-25 MED ORDER — FENTANYL CITRATE (PF) 100 MCG/2ML IJ SOLN
INTRAMUSCULAR | Status: DC | PRN
  Administered 2020-12-25 (×3): 50 ug via INTRAVENOUS

## 2020-12-25 MED ORDER — SODIUM CHLORIDE 0.9 % IV SOLN
INTRAVENOUS | Status: AC | PRN
  Administered 2020-12-25: 250 mL via INTRAMUSCULAR

## 2020-12-25 MED ORDER — DIPHENHYDRAMINE HCL 50 MG/ML IJ SOLN: 25.0000 mg | Freq: Four times a day (QID) | INTRAMUSCULAR | Status: DC | PRN

## 2020-12-25 MED ORDER — SUGAMMADEX SODIUM 200 MG/2ML IV SOLN
INTRAVENOUS | Status: DC | PRN
  Administered 2020-12-25: 200 mg via INTRAVENOUS

## 2020-12-25 MED ORDER — CEFAZOLIN SODIUM-DEXTROSE 2-4 GM/100ML-% IV SOLN
INTRAVENOUS | Status: AC
  Filled 2020-12-25: qty 100

## 2020-12-25 MED ORDER — ESCITALOPRAM OXALATE 10 MG PO TABS: 10.0000 mg | ORAL_TABLET | Freq: Every day | ORAL | Status: DC

## 2020-12-25 MED ORDER — ONDANSETRON HCL 4 MG/2ML IJ SOLN
INTRAMUSCULAR | Status: DC | PRN
  Administered 2020-12-25: 4 mg via INTRAVENOUS

## 2020-12-25 MED ORDER — ACETAMINOPHEN 650 MG RE SUPP: 650.0000 mg | Freq: Four times a day (QID) | RECTAL | Status: DC | PRN

## 2020-12-25 MED ORDER — DIPHENHYDRAMINE HCL 25 MG PO CAPS: 25.0000 mg | ORAL_CAPSULE | Freq: Four times a day (QID) | ORAL | Status: DC | PRN

## 2020-12-25 MED ORDER — CEFAZOLIN SODIUM-DEXTROSE 2-4 GM/100ML-% IV SOLN
2.0000 g | INTRAVENOUS | Status: AC
  Administered 2020-12-25: 2 g via INTRAVENOUS

## 2020-12-25 MED ORDER — KETOROLAC TROMETHAMINE 15 MG/ML IJ SOLN: 15.0000 mg | INTRAMUSCULAR | Status: AC

## 2020-12-25 SURGICAL SUPPLY — 92 items
APPLIER CLIP 9.375 MED OPEN (MISCELLANEOUS) ×2
BAG COUNTER SPONGE SURGICOUNT (BAG) ×4 IMPLANT
BAG DECANTER FOR FLEXI CONT (MISCELLANEOUS) ×2 IMPLANT
BENZOIN TINCTURE PRP APPL 2/3 (GAUZE/BANDAGES/DRESSINGS) IMPLANT
BIOPATCH RED 1 DISK 7.0 (GAUZE/BANDAGES/DRESSINGS) IMPLANT
BLADE SURG 15 STRL LF DISP TIS (BLADE) ×1 IMPLANT
BLADE SURG 15 STRL SS (BLADE) ×1
BNDG ELASTIC 6X5.8 VLCR STR LF (GAUZE/BANDAGES/DRESSINGS) ×2 IMPLANT
BNDG GAUZE ELAST 4 BULKY (GAUZE/BANDAGES/DRESSINGS) IMPLANT
CANISTER SUCT 3000ML PPV (MISCELLANEOUS) ×2 IMPLANT
CHLORAPREP W/TINT 26 (MISCELLANEOUS) ×4 IMPLANT
CLIP APPLIE 9.375 MED OPEN (MISCELLANEOUS) ×1 IMPLANT
CNTNR URN SCR LID CUP LEK RST (MISCELLANEOUS) IMPLANT
CONT SPEC 4OZ STRL OR WHT (MISCELLANEOUS)
COVER PROBE W GEL 5X96 (DRAPES) ×2 IMPLANT
COVER SURGICAL LIGHT HANDLE (MISCELLANEOUS) ×2 IMPLANT
DECANTER SPIKE VIAL GLASS SM (MISCELLANEOUS) IMPLANT
DERMABOND ADVANCED (GAUZE/BANDAGES/DRESSINGS)
DERMABOND ADVANCED .7 DNX12 (GAUZE/BANDAGES/DRESSINGS) IMPLANT
DRAIN CHANNEL 15F RND FF W/TCR (WOUND CARE) IMPLANT
DRAIN CHANNEL 19F RND (DRAIN) IMPLANT
DRAPE HALF SHEET 70X43 (DRAPES) ×2 IMPLANT
DRAPE LAPAROSCOPIC ABDOMINAL (DRAPES) IMPLANT
DRAPE ORTHO SPLIT 77X108 STRL (DRAPES) ×2
DRAPE SURG ORHT 6 SPLT 77X108 (DRAPES) ×2 IMPLANT
DRSG PAD ABDOMINAL 8X10 ST (GAUZE/BANDAGES/DRESSINGS) ×4 IMPLANT
DRSG TEGADERM 4X10 (GAUZE/BANDAGES/DRESSINGS) IMPLANT
DRSG TEGADERM 4X4.75 (GAUZE/BANDAGES/DRESSINGS) IMPLANT
ELECT BLADE 4.0 EZ CLEAN MEGAD (MISCELLANEOUS) ×2
ELECT CAUTERY BLADE 6.4 (BLADE) ×2 IMPLANT
ELECT COATED BLADE 2.86 ST (ELECTRODE) ×2 IMPLANT
ELECT REM PT RETURN 9FT ADLT (ELECTROSURGICAL) ×2
ELECTRODE BLDE 4.0 EZ CLN MEGD (MISCELLANEOUS) ×1 IMPLANT
ELECTRODE REM PT RTRN 9FT ADLT (ELECTROSURGICAL) ×1 IMPLANT
EVACUATOR SILICONE 100CC (DRAIN) ×2 IMPLANT
EXPANDER TISSUE CPX4 450CC (Expander) ×2 IMPLANT
GAUZE SPONGE 4X4 12PLY STRL (GAUZE/BANDAGES/DRESSINGS) IMPLANT
GLOVE SRG 8 PF TXTR STRL LF DI (GLOVE) IMPLANT
GLOVE SURG ENC MOIS LTX SZ7 (GLOVE) ×2 IMPLANT
GLOVE SURG ENC TEXT LTX SZ7 (GLOVE) ×2 IMPLANT
GLOVE SURG ENC TEXT LTX SZ7.5 (GLOVE) ×4 IMPLANT
GLOVE SURG UNDER POLY LF SZ7.5 (GLOVE) ×2 IMPLANT
GLOVE SURG UNDER POLY LF SZ8 (GLOVE)
GOWN STRL REUS W/ TWL LRG LVL3 (GOWN DISPOSABLE) ×6 IMPLANT
GOWN STRL REUS W/TWL LRG LVL3 (GOWN DISPOSABLE) ×6
GRAFT FLEX HD 19X22X0.7-1.4 (Tissue) ×2 IMPLANT
KIT BASIN OR (CUSTOM PROCEDURE TRAY) ×2 IMPLANT
KIT FILL ASEPTIC TRANSFER (MISCELLANEOUS) ×2 IMPLANT
KIT FILL SYSTEM UNIVERSAL (SET/KITS/TRAYS/PACK) IMPLANT
KIT TURNOVER KIT B (KITS) ×2 IMPLANT
MARKER SKIN DUAL TIP RULER LAB (MISCELLANEOUS) IMPLANT
NEEDLE 18GX1X1/2 (RX/OR ONLY) (NEEDLE) IMPLANT
NEEDLE 21 GA WING INFUSION (NEEDLE) ×2 IMPLANT
NEEDLE FILTER BLUNT 18X 1/2SAF (NEEDLE)
NEEDLE FILTER BLUNT 18X1 1/2 (NEEDLE) IMPLANT
NEEDLE HYPO 25GX1X1/2 BEV (NEEDLE) ×2 IMPLANT
NEEDLE HYPO 25X1 1.5 SAFETY (NEEDLE) ×2 IMPLANT
NS IRRIG 1000ML POUR BTL (IV SOLUTION) ×2 IMPLANT
PACK GENERAL/GYN (CUSTOM PROCEDURE TRAY) ×4 IMPLANT
PACK SPY-PHI (KITS) ×2 IMPLANT
PAD ARMBOARD 7.5X6 YLW CONV (MISCELLANEOUS) ×2 IMPLANT
PENCIL SMOKE EVACUATOR (MISCELLANEOUS) ×4 IMPLANT
PIN SAFETY STERILE (MISCELLANEOUS) IMPLANT
RETRACTOR ONETRAX LX 135X30 (MISCELLANEOUS) IMPLANT
SLEEVE SCD COMPRESS KNEE MED (STOCKING) ×2 IMPLANT
SPECIMEN JAR X LARGE (MISCELLANEOUS) ×2 IMPLANT
SPONGE T-LAP 18X18 ~~LOC~~+RFID (SPONGE) ×6 IMPLANT
STAPLER INSORB 30 2030 C-SECTI (MISCELLANEOUS) ×2 IMPLANT
STAPLER VISISTAT 35W (STAPLE) ×2 IMPLANT
STRIP CLOSURE SKIN 1/2X4 (GAUZE/BANDAGES/DRESSINGS) ×4 IMPLANT
SUT ETHILON 2 0 FS 18 (SUTURE) IMPLANT
SUT MNCRL AB 4-0 PS2 18 (SUTURE) IMPLANT
SUT MON AB 3-0 SH 27 (SUTURE) ×1
SUT MON AB 3-0 SH27 (SUTURE) ×1 IMPLANT
SUT MON AB 4-0 PC3 18 (SUTURE) IMPLANT
SUT PDS AB 3-0 SH 27 (SUTURE) ×12 IMPLANT
SUT PLAIN 5 0 P 3 18 (SUTURE) IMPLANT
SUT SILK 2 0 SH (SUTURE) ×2 IMPLANT
SUT VIC AB 3-0 54X BRD REEL (SUTURE) IMPLANT
SUT VIC AB 3-0 BRD 54 (SUTURE)
SUT VIC AB 3-0 SH 18 (SUTURE) ×2 IMPLANT
SUT VIC AB 3-0 SH 8-18 (SUTURE) IMPLANT
SUT VLOC 180 0 6IN GS21 (SUTURE) ×2 IMPLANT
SUT VLOC 90 P-14 23 (SUTURE) ×4 IMPLANT
SYR 50ML LL SCALE MARK (SYRINGE) ×2 IMPLANT
SYR BULB IRRIG 60ML STRL (SYRINGE) ×2 IMPLANT
SYR CONTROL 10ML LL (SYRINGE) IMPLANT
TAPE MEASURE VINYL STERILE (MISCELLANEOUS) IMPLANT
TOWEL GREEN STERILE (TOWEL DISPOSABLE) IMPLANT
TOWEL GREEN STERILE FF (TOWEL DISPOSABLE) ×2 IMPLANT
TRAY FOLEY W/BAG SLVR 14FR LF (SET/KITS/TRAYS/PACK) IMPLANT
TUBE CONNECTING 20X1/4 (TUBING) ×2 IMPLANT

## 2020-12-25 NOTE — Telephone Encounter (Signed)
Called and LMOM for Matthew,PA-C to return call regarding the message below.    Spoke with [facility] regarding the message and he stated to let Benoit from [place] know that he will be placing the orders now,and the orders will be for Regular diet.  Called and spoke with Franklin from [place] and informed her that I spoke with [facility] and he will be placing the orders, and the orders will be for Regular diet.  Beverly verbalized understanding and agreed.//AB/CMA

## 2020-12-25 NOTE — Op Note (Signed)
Operative Note   DATE OF OPERATION: 12/25/2020  SURGICAL DEPARTMENT: Plastic Surgery  PREOPERATIVE DIAGNOSES: Right breast cancer  POSTOPERATIVE DIAGNOSES:  same  PROCEDURE: 1.  Right breast reconstruction with tissue expander and acellular dermal matrix 2.  Indocyanine green angiography of right mastectomy flaps  SURGEON: Talmadge Coventry, MD  ASSISTANT: Verdie Shire, PA The advanced practice practitioner (APP) assisted throughout the case.  The APP was essential in retraction and counter traction when needed to make the case progress smoothly.  This retraction and assistance made it possible to see the tissue plans for the procedure.  The assistance was needed for blood control, tissue re-approximation and assisted with closure of the incision site.  ANESTHESIA:  General.   COMPLICATIONS: None.   INDICATIONS FOR PROCEDURE:  The patient, Connie Underwood is a 45 y.o. female born on 1975-06-22, is here for treatment of right mastectomy defect MRN: [id number]  CONSENT:  Informed consent was obtained directly from the patient. Risks, benefits and alternatives were fully discussed. Specific risks including but not limited to bleeding, infection, hematoma, seroma, scarring, pain, contracture, asymmetry, wound healing problems, and need for further surgery were all discussed. The patient did have an ample opportunity to have questions answered to satisfaction.   DESCRIPTION OF PROCEDURE:  The patient was taken to the operating room. SCDs were placed and antibiotics were given.  General anesthesia was administered.  The patient's operative site was prepped and draped in a sterile fashion. A time out was performed and all information was confirmed to be correct.  Dr. Donne Hazel performed his portion of the case and the patient was turned over to me.  This was a skin sparing mastectomy.  Started by examining mastectomy flaps.  Clinically they looked great.  Indocyanine green angiography was  then performed demonstrating overall good fill with a little bit of delayed fill along the superior border of the incision.  I then drew out a circle with a marking pen around this and took a few millimeters of skin circumferentially to freshen up the edges.  This was sent with the breast specimen.  I then ensured meticulous hemostasis.  Lateral soft tissues were then tacked down to the chest wall with a running 0 V-Loc suture to close off the dead space.  35 French drain was placed in the pocket and secured with a nylon suture.  Pocket was then irrigated copiously with triple antibiotic solution.  Expander was then brought onto the field.  This was a Product manager CPX 4 medium height expander with a prescribed fill volume of 450 cc.  All the air was removed from the expander.  They Flex HD pliable pre-Peace was then brought onto the field and soaked in saline.  A 3-0 PDS suture was run around the periphery of this and then the expander was placed within it and the pursestring tied down to secure the matrix.  Suture tabs were then brought out at 3, 6, and 9:00.  Corresponding 3, 6, and 9:00 3-0 PDS stay sutures were then placed into the chest wall and run through the suture tabs.  Expander and matrix were then placed into the pocket and stay sutures tied down.  Expander was then inflated to 250 cc of saline which provided no tension on the skin closure.  Skin closure was then done with buried Enzor staples and a running 3 oh V-Loc.  Steri-Strips were then applied along with a soft dressing.  The patient tolerated the procedure well.  There were no complications.  The patient was allowed to wake from anesthesia, extubated and taken to the recovery room in satisfactory condition.

## 2020-12-25 NOTE — Interval H&P Note (Signed)
History and Physical Interval Note:  12/25/2020 7:25 AM  Connie Underwood  has presented today for surgery, with the diagnosis of RIGHT BREAST CANCER.  The various methods of treatment have been discussed with the patient and family. After consideration of risks, benefits and other options for treatment, the patient has consented to  Procedure(s): RIGHT MASTECTOMY WITH RIGHT AXILLARY SENTINEL LYMPH NODE BIOPSY (Right) RIGHT BREAST RECONSTRUCTION WITH PLACEMENT OF TISSUE EXPANDER AND FLEX HD (ACELLULAR HYDRATED DERMIS) (Right) as a surgical intervention.  The patient's history has been reviewed, patient examined, no change in status, stable for surgery.  I have reviewed the patient's chart and labs.  Questions were answered to the patient's satisfaction.     Rolm Bookbinder

## 2020-12-25 NOTE — H&P (Signed)
44-year-old female who has no prior breast history and presents with a palpable right breast mass that she noticed 2 weeks ago. She has no nipple discharge. She underwent mammogram and ultrasound. This shows a 3 x 2.5 x 2.1 cm density that looks contiguous with another 3 cm density. Her axillary ultrasound was negative. 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 She has a family history of multiple relatives with colon and prostate cancer. She only has a history of migraines for which she is on medication. She is here to discuss options.  Review of Systems: A complete review of systems was obtained from the patient. I have reviewed this information and discussed as appropriate with the patient. See HPI as well for other ROS.  Review of Systems  Constitutional: Positive for malaise/fatigue.  All other systems reviewed and are negative.  Medical History: Past Medical History:  Diagnosis Date   Known health problems: none   Patient Active Problem List  Diagnosis   Breast cancer of upper-inner quadrant of right female breast (CMS-HCC)   History reviewed. No pertinent surgical history.   No Known Allergies  Current Outpatient Medications on File Prior to Visit  Medication Sig Dispense Refill   EMGALITY PEN 120 mg/mL PnIj 1 ML MONTHLY SUBCUTANEOUS 30 DAYS   No current facility-administered medications on file prior to visit.   History reviewed. No pertinent family history.   Social History   Tobacco Use  Smoking Status Never Smoker  Smokeless Tobacco Never Used    Social History   Socioeconomic History   Marital status: Married  Tobacco Use   Smoking status: Never Smoker   Smokeless tobacco: Never Used  Substance and Sexual Activity   Alcohol use: Never   Drug use: Never   Objective:   Physical Exam Constitutional:  Appearance: Normal appearance.   HENT:  Head: Normocephalic and atraumatic.  Eyes:  General: No scleral icterus. Cardiovascular:  Rate and Rhythm: Normal rate.  Pulmonary:  Effort: Pulmonary effort is normal.  Comments: Palpable breast right side with surrounding hematoma No axillary or cervical adenopathy Neurological:  Mental Status: She is alert.  Psychiatric:  Mood and Affect: Mood normal.  Behavior: Behavior normal.   Assessment and Plan:   Carcinoma of upper-inner quadrant of right breast in female, estrogen receptor positive (CMS-HCC)  Right mastectomy, right axillary sentinel node biopsy, expander reconstruction  We discussed the staging and pathophysiology of breast cancer. We discussed all of the different options for treatment for breast cancer including surgery, chemotherapy, radiation therapy, Herceptin, and antiestrogen therapy. We discussed a sentinel lymph node biopsy as she does not appear to having lymph node involvement right now. We discussed the performance of that with injection of radioactive tracer. We discussed that there is a chance of having a positive node with a sentinel lymph node biopsy and we will await the permanent pathology to make any other first further decisions in terms of her treatment. We discussed up to a 5% risk lifetime of chronic shoulder pain as well as lymphedema associated with a sentinel lymph node biopsy. I will have her get Sozo measurements prior to surgery as well.  We discussed the options for treatment of the breast cancer which included lumpectomy versus a mastectomy. We discussed the performance of the lumpectomy with radioactive seed placement. We discussed a 5-10% chance of a positive margin requiring reexcision in the   operating room. We also discussed that she will likely need radiation therapy if she undergoes lumpectomy. We discussed mastectomy and the postoperative care for that as well. Mastectomy can be followed by reconstruction. The decision for lumpectomy  vs mastectomy has no impact on decision for chemotherapy. Most mastectomy patients will not need radiation therapy. We discussed that there is no difference in her survival whether she undergoes lumpectomy with radiation therapy or antiestrogen therapy versus a mastectomy. There is also no real difference between her recurrence in the breast. We discussed the risks of operation including bleeding, infection, possible reoperation. She understands her further therapy will be based on what her stages at the time of her operation.  

## 2020-12-25 NOTE — Anesthesia Postprocedure Evaluation (Signed)
Anesthesia Post Note  Patient: KARIANN WECKER  Procedure(s) Performed: RIGHT MASTECTOMY WITH RIGHT AXILLARY SENTINEL LYMPH NODE BIOPSY (Right: Breast) RIGHT BREAST RECONSTRUCTION WITH PLACEMENT OF TISSUE EXPANDER AND FLEX HD (ACELLULAR HYDRATED DERMIS) (Right: Breast)     Patient location during evaluation: PACU Anesthesia Type: General Level of consciousness: awake Pain management: pain level controlled Vital Signs Assessment: post-procedure vital signs reviewed and stable Respiratory status: spontaneous breathing Cardiovascular status: stable Postop Assessment: no apparent nausea or vomiting Anesthetic complications: no   No notable events documented.  Last Vitals:  Vitals:   12/25/20 1820 12/25/20 2137  BP: 121/67 117/63  Pulse: 97 87  Resp: (!) 21   Temp: 37.1 C 36.9 C  SpO2: 99% 98%    Last Pain:  Vitals:   12/25/20 2137  TempSrc: Oral  PainSc:                  Elliemae Braman

## 2020-12-25 NOTE — Telephone Encounter (Signed)
Rise Paganini called from [facility] to request a diet order for this patient.  Please call.

## 2020-12-25 NOTE — Transfer of Care (Signed)
Immediate Anesthesia Transfer of Care Note  Patient: Connie Underwood  Procedure(s) Performed: RIGHT MASTECTOMY WITH RIGHT AXILLARY SENTINEL LYMPH NODE BIOPSY (Right: Breast) RIGHT BREAST RECONSTRUCTION WITH PLACEMENT OF TISSUE EXPANDER AND FLEX HD (ACELLULAR HYDRATED DERMIS) (Right: Breast)  Patient Location: PACU  Anesthesia Type:General  Level of Consciousness: awake, alert  and drowsy  Airway & Oxygen Therapy: Patient Spontanous Breathing and Patient connected to face mask  Post-op Assessment: Report given to RN and Post -op Vital signs reviewed and stable  Post vital signs: stable  Last Vitals:  Vitals Value Taken Time  BP 113/70 12/25/20 1026  Temp    Pulse 87 12/25/20 1027  Resp 23 12/25/20 1027  SpO2 100 % 12/25/20 1027  Vitals shown include unvalidated device data.  Last Pain:  Vitals:   12/25/20 0609  TempSrc: Oral  PainSc:          Complications: No notable events documented.

## 2020-12-25 NOTE — Anesthesia Procedure Notes (Signed)
Procedure Name: Intubation Date/Time: 12/25/2020 7:54 AM Performed by: Yehuda Mao, CRNA Pre-anesthesia Checklist: Patient identified, Emergency Drugs available, Suction available and Patient being monitored Patient Re-evaluated:Patient Re-evaluated prior to induction Oxygen Delivery Method: Circle system utilized Preoxygenation: Pre-oxygenation with 100% oxygen Induction Type: IV induction Ventilation: Mask ventilation without difficulty Laryngoscope Size: Mac and 3 Grade View: Grade I Tube type: Oral Tube size: 7.0 mm Number of attempts: 1 Airway Equipment and Method: Stylet and Oral airway Placement Confirmation: ETT inserted through vocal cords under direct vision, positive ETCO2 and breath sounds checked- equal and bilateral Tube secured with: Tape Dental Injury: Teeth and Oropharynx as per pre-operative assessment

## 2020-12-25 NOTE — Op Note (Signed)
Preoperative diagnosis: Clinical stage II right breast cancer Postoperative diagnosis: Same as above Procedure: 1.  Right skin sparing mastectomy 2.  Right deep axillary sentinel lymph node biopsy 3.  Injection of mag trace for sentinel lymph node identification Surgeon: Dr. Serita Grammes Assistant: Carlena Hurl, PA-C Estimated blood loss: 50 cc Specimens: 1.  Right breast tissue marked short superior, long lateral 2.  Right deep axillary sentinel lymph node with highest count of 5809 Complications: None Drains per plastic surgery Sponge and needle count was correct at the end of my portion. Disposition Case turned over to plastic surgery reconstruction  Indications: This is a 45 year old female who noticed a right breast mass.  She underwent evaluation that had a 3 cm density of the lip contiguous with an additional 3 cm density.  Biopsy was a grade 2 invasive ductal carcinoma that was hormone receptor positive, HER2 negative.  Ultrasound of her axilla was negative.  Due to the large volume of diseasewe elected to proceed with a mastectomy.  She is going undergo reconstruction at the same time.  Procedure: After informed consent was obtained the patient first was given a pectoral block.  I then did a timeout.  I then injected 3 cc of mag trace in the subareolar position after prepping the skin.  She tolerated this well.  She was then taken to the operating room.  She was given antibiotics.  SCDs were in place.  She was placed under general anesthesia without complication.  She was prepped and draped in standard sterile surgical fashion.  Surgical timeout was then performed again.  I made a small elliptical incision just encompassing the nipple areolar complex in order to save the skin for later reconstruction.  I then created flaps to the parasternal area, clavicle, inframammary fold, and the latissimus laterally.  I then remove the breast as well as the pectoralis fashion from the muscle.   This was then passed off the table as above.  I thinned a little bit of the superior flap.  I then obtained hemostasis. I then used the center mag probe to identify what appeared to be 1-2 lymph nodes that were both brown and active.  I remove these and passed these off the table as sentinel nodes.  There was no background activity and no more color in the axilla.  There were no palpable lymph nodes in the axilla either.  I then obtained hemostasis.  I packed this and turned the case over to plastic surgery for reconstruction.

## 2020-12-25 NOTE — Brief Op Note (Signed)
12/25/2020  10:39 AM  PATIENT:  Connie Underwood  45 y.o. female  PRE-OPERATIVE DIAGNOSIS:  RIGHT BREAST CANCER  POST-OPERATIVE DIAGNOSIS:  RIGHT BREAST CANCER  PROCEDURE:  Procedure(s): RIGHT MASTECTOMY WITH RIGHT AXILLARY SENTINEL LYMPH NODE BIOPSY (Right) RIGHT BREAST RECONSTRUCTION WITH PLACEMENT OF TISSUE EXPANDER AND FLEX HD (ACELLULAR HYDRATED DERMIS) (Right)  SURGEON:  Surgeon(s) and Role: Panel 1:    * Rolm Bookbinder, MD - Primary    * Carlena Hurl, PA-C - Assisting Panel 2:    * Cindra Presume, MD - Primary  PHYSICIAN ASSISTANT: Matt Scheeler, PA  ASSISTANTS: none   ANESTHESIA:   general  EBL:  100 mL   BLOOD ADMINISTERED:none  DRAINS: (1) Jackson-Pratt drain(s) with closed bulb suction in the chest    LOCAL MEDICATIONS USED:  MARCAINE     SPECIMEN:  No Specimen  DISPOSITION OF SPECIMEN:  PATHOLOGY  COUNTS:  YES  TOURNIQUET:  * No tourniquets in log *  DICTATION: .Dragon Dictation  PLAN OF CARE: Admit to inpatient   PATIENT DISPOSITION:  PACU - hemodynamically stable.   Delay start of Pharmacological VTE agent (>24hrs) due to surgical blood loss or risk of bleeding: not applicable

## 2020-12-25 NOTE — Anesthesia Procedure Notes (Signed)
Anesthesia Regional Block: Pectoralis block   Pre-Anesthetic Checklist: , timeout performed,  Correct Patient, Correct Site, Correct Laterality,  Correct Procedure, Correct Position, site marked,  Risks and benefits discussed,  Surgical consent,  Pre-op evaluation,  At surgeon's request and post-op pain management  Laterality: Right  Prep: chloraprep       Needles:  Injection technique: Single-shot  Needle Type: Echogenic Needle          Additional Needles:   Procedures: Doppler guided,,,, ultrasound used (permanent image in chart),,    Narrative:  Start time: 12/25/2020 7:10 AM End time: 12/25/2020 7:25 AM Injection made incrementally with aspirations every 5 mL.  Performed by: Personally  Anesthesiologist: Belinda Block, MD

## 2020-12-26 ENCOUNTER — Encounter (HOSPITAL_COMMUNITY): Payer: Self-pay | Admitting: General Surgery

## 2020-12-26 DIAGNOSIS — R7309 Other abnormal glucose: Secondary | ICD-10-CM | POA: Diagnosis not present

## 2020-12-26 DIAGNOSIS — C50211 Malignant neoplasm of upper-inner quadrant of right female breast: Secondary | ICD-10-CM | POA: Diagnosis not present

## 2020-12-26 NOTE — Discharge Summary (Signed)
Physician Discharge Summary  Patient ID: Connie Underwood MRN: [id number] DOB/AGE: 12-18-1975 45 y.o.  Admit date: 12/25/2020 Discharge date: 12/26/2020  Admission Diagnoses:  Discharge Diagnoses:  Active Problems:   Malignant neoplasm of upper-inner quadrant of right breast in female, estrogen receptor positive ([place])   Discharged Condition: good  Hospital Course: 45 year old female presented to the [facility] for right skin sparing mastectomy, right deep axillary sentinel lymph node biopsy by Dr. Donne Hazel followed by immediate right breast reconstruction with placement of tissue expander and Flex HD with Dr. Claudia Desanctis on 12/25/2020.  Patient stayed overnight for observation.  No acute overnight events.  She is feeling well this a.m. with some mild pain, she is ready for discharge.  Consults: None  Significant Diagnostic Studies: None  Treatments: antibiotics: Bactrim and analgesia: acetaminophen and Vicodin  Discharge Exam: Blood pressure 132/78, pulse 81, temperature 98.4 F (36.9 C), temperature source Oral, resp. rate 17, height 5\' 5"  (1.651 m), weight 106.6 kg, last menstrual period 12/19/2020, SpO2 99 %. General appearance: alert, cooperative, no distress, and sitting up in bed, resting, RN at bedside Head: Normocephalic, without obvious abnormality, atraumatic Resp: Unlabored Breasts: Normal appearance of left breast, right breast status post right mastectomy with Steri-Strips in place, skin is viable with good color and capillary refill.  No skin necrosis noted.  Right JP drain is in place with serosanguineous drainage noted.  No cellulitic changes are noted.  No subcutaneous fluid collection noted with palpation. Extremities: extremities normal, atraumatic, no cyanosis or edema   Disposition: Discharge disposition: 01-Home or Self Care       Discharge Instructions     Call MD for:  difficulty breathing, headache or visual disturbances   Complete by: As directed     Call MD for:  extreme fatigue   Complete by: As directed    Call MD for:  hives   Complete by: As directed    Call MD for:  persistant dizziness or light-headedness   Complete by: As directed    Call MD for:  persistant nausea and vomiting   Complete by: As directed    Call MD for:  redness, tenderness, or signs of infection (pain, swelling, redness, odor or green/yellow discharge around incision site)   Complete by: As directed    Call MD for:  severe uncontrolled pain   Complete by: As directed    Call MD for:  temperature >100.4   Complete by: As directed    Diet - low sodium heart healthy   Complete by: As directed    Increase activity slowly   Complete by: As directed       Allergies as of 12/26/2020       Reactions   Tomato    Hives   Pollen Extract Other (See Comments)   Seasonal allergies         Medication List     TAKE these medications    benzonatate 100 MG capsule Commonly known as: TESSALON Take 1-2 capsules (100-200 mg total) by mouth 3 (three) times daily as needed for cough.   cetirizine 10 MG tablet Commonly known as: ZyrTEC Allergy Take 1 tablet (10 mg total) by mouth daily.   Emgality 120 MG/ML Soaj Generic drug: Galcanezumab-gnlm Inject 1 mL into the skin every 30 (thirty) days.   escitalopram 10 MG tablet Commonly known as: LEXAPRO Take 10 mg by mouth daily.   fluticasone 50 MCG/ACT nasal spray Commonly known as: FLONASE Place 2 sprays into both nostrils  daily.   naproxen 500 MG tablet Commonly known as: NAPROSYN Take 1 tablet (500 mg total) by mouth 2 (two) times daily with a meal.   ondansetron 4 MG tablet Commonly known as: Zofran Take 1 tablet (4 mg total) by mouth every 8 (eight) hours as needed for nausea or vomiting.   promethazine-dextromethorphan 6.25-15 MG/5ML syrup Commonly known as: PROMETHAZINE-DM Take 5 mLs by mouth at bedtime as needed for cough.   pseudoephedrine 60 MG tablet Commonly known as: SUDAFED Take 1  tablet (60 mg total) by mouth every 8 (eight) hours as needed for congestion.   SUMAtriptan 50 MG tablet Commonly known as: IMITREX Take 50 mg by mouth every 2 (two) hours as needed for migraine.   tamoxifen 20 MG tablet Commonly known as: NOLVADEX Take 1 tablet (20 mg total) by mouth daily.   tiZANidine 4 MG tablet Commonly known as: Zanaflex Take 1 tablet (4 mg total) by mouth at bedtime.   Vitamin D (Ergocalciferol) 1.25 MG (50000 UNIT) Caps capsule Commonly known as: DRISDOL Take 50,000 Units by mouth once a week.        Follow-up Information     Rolm Bookbinder, MD Follow up in 2 week(s).   Specialty: General Surgery Contact information: [place] [phone]                 [facility] [address] [place], [place] [address] [phone]  Signed: Carola Rhine Korben Carcione 12/26/2020, 11:18 AM

## 2020-12-26 NOTE — Progress Notes (Signed)
[name] to be D/C'd per MD order. Discussed with the patient and all questions fully answered. ? VSS, Skin clean, dry and intact without evidence of skin break down, no evidence of skin tears noted. ? IV catheter discontinued intact. Site without signs and symptoms of complications. Dressing and pressure applied. ? An After Visit Summary was printed and given to the patient. Patient informed where to pickup prescriptions. ? D/C education completed with patient/family including follow up instructions, drain care and maintenance, medication list, d/c activities limitations if indicated, with other d/c instructions as indicated by MD - patient able to verbalize understanding, all questions fully answered.  ? Patient instructed to return to ED, call 911, or call MD for any changes in condition.  ? Patient to be escorted via [place], and D/C home via private auto.

## 2020-12-26 NOTE — Progress Notes (Signed)
Patient ID: Connie Underwood, female   DOB: May 08, 1975, 45 y.o.   MRN: [id number] Doing well, path pending, will f/u soon

## 2020-12-28 ENCOUNTER — Telehealth: Payer: Self-pay | Admitting: Family Medicine

## 2020-12-28 NOTE — Telephone Encounter (Signed)
   Connie Underwood DOB: 09-07-1975 MRN: [id number]   RIDER WAIVER AND RELEASE OF LIABILITY  For purposes of improving physical access to our facilities, [facility] is pleased to partner with third parties to provide [place] patients or other authorized individuals the option of convenient, on-demand ground transportation services (the [place]") through use of the technology service that enables users to request on-demand ground transportation from independent third-party providers.  By opting to use and accept these [employer], I, the undersigned, hereby agree on behalf of myself, and on behalf of any minor child using the Government social research officer for whom I am the parent or legal guardian, as follows:  Government social research officer provided to me are provided by independent third-party transportation providers who are not [employer] or employees and who are unaffiliated with [employer]. [facility] is neither a transportation carrier nor a common or public carrier. [facility] has no control over the quality or safety of the transportation that occurs as a result of the [employer]. [facility] cannot guarantee that any third-party transportation provider will complete any arranged transportation service. [facility] makes no representation, warranty, or guarantee regarding the reliability, timeliness, quality, safety, suitability, or availability of any of the Transport Services or that they will be error free. I fully understand that traveling by vehicle involves risks and dangers of serious bodily injury, including permanent disability, paralysis, and death. I agree, on behalf of myself and on behalf of any minor child using the Transport Services for whom I am the parent or legal guardian, that the entire risk arising out of my use of the [employer] remains solely with me, to the maximum extent permitted under applicable law. The [employer] are provided  "as is" and "as available." [facility] disclaims all representations and warranties, express, implied or statutory, not expressly set out in these terms, including the implied warranties of merchantability and fitness for a particular purpose. I hereby waive and release [facility], its agents, employees, officers, directors, representatives, insurers, attorneys, assigns, successors, subsidiaries, and affiliates from any and all past, present, or future claims, demands, liabilities, actions, causes of action, or suits of any kind directly or indirectly arising from acceptance and use of the [employer]. I further waive and release [facility] and its affiliates from all present and future liability and responsibility for any injury or death to persons or damages to property caused by or related to the use of the [employer]. I have read this Waiver and Release of Liability, and I understand the terms used in it and their legal significance. This Waiver is freely and voluntarily given with the understanding that my right (as well as the right of any minor child for whom I am the parent or legal guardian using the [employer]) to legal recourse against [facility] in connection with the [employer] is knowingly surrendered in return for use of these services.   I attest that I read the consent document to Connie Underwood, gave Ms. Connie Underwood the opportunity to ask questions and answered the questions asked (if any). I affirm that Connie Underwood then provided consent for she's participation in this program.     Connie Underwood

## 2021-01-01 ENCOUNTER — Encounter: Payer: Self-pay | Admitting: *Deleted

## 2021-01-03 ENCOUNTER — Ambulatory Visit (INDEPENDENT_AMBULATORY_CARE_PROVIDER_SITE_OTHER): Payer: BC Managed Care – PPO | Admitting: Plastic Surgery

## 2021-01-03 ENCOUNTER — Other Ambulatory Visit: Payer: Self-pay

## 2021-01-03 DIAGNOSIS — Z170 Estrogen receptor positive status [ER+]: Secondary | ICD-10-CM

## 2021-01-03 DIAGNOSIS — C50211 Malignant neoplasm of upper-inner quadrant of right female breast: Secondary | ICD-10-CM

## 2021-01-03 NOTE — Progress Notes (Signed)
Patient presents postop from right breast reconstruction with tissue expander and acellular dermal matrix.  She is overall feeling good.  On exam her skin looks to be doing fine with minimal bruising.  No signs of skin compromise.  Incision is intact.  Drain putting out around 20 to 30 cc/day.  We will plan to leave it in another week due to the matrix.  We could probably start doing some fills at that point if she is interested.  We will plan to see her then.  In the meantime I recommended continuing supportive wrap.

## 2021-01-10 ENCOUNTER — Other Ambulatory Visit: Payer: Self-pay

## 2021-01-10 ENCOUNTER — Encounter: Payer: Self-pay | Admitting: Surgical

## 2021-01-10 ENCOUNTER — Ambulatory Visit (INDEPENDENT_AMBULATORY_CARE_PROVIDER_SITE_OTHER): Payer: BC Managed Care – PPO | Admitting: Surgical

## 2021-01-10 DIAGNOSIS — Z170 Estrogen receptor positive status [ER+]: Secondary | ICD-10-CM

## 2021-01-10 DIAGNOSIS — C50211 Malignant neoplasm of upper-inner quadrant of right female breast: Secondary | ICD-10-CM

## 2021-01-10 NOTE — Addendum Note (Signed)
Addended by: Harl Bowie on: 01/10/2021 12:21 PM   Modules accepted: Orders

## 2021-01-10 NOTE — Progress Notes (Signed)
Patient is a 45 year old female here for follow-up after right breast reconstruction and placement of tissue expander and Flex HD with Dr. Claudia Desanctis.  She is 2 weeks postop.  Patient reports JP drain output has been 10 cc per 24 hours.  She is doing really well.  No infectious symptoms.  Chaperone present on exam On exam right breast incision is intact, right JP drain is in place with serous fluid in the bulb.  No erythema or cellulitic changes.  No subcutaneous fluid collection noted palpation.  No bruising or tissue viability concerns.  We discussed possibility for left breast lift/reduction for symmetry.  Patient is going to think this over.  We also discussed planning for exchange around December.  We will plan to discuss this further at her follow-up in 1 week.  I do not see any signs of infection on exam.  We placed injectable saline in the Expander using a sterile technique: Right: 100 cc for a total of 350 / 450 cc Patient tolerated fill well, she was pleasantly surprised by increased volume.  Recommend calling with questions or concerns.

## 2021-01-15 ENCOUNTER — Other Ambulatory Visit: Payer: Self-pay | Admitting: Hematology

## 2021-01-16 LAB — SURGICAL PATHOLOGY

## 2021-01-22 ENCOUNTER — Inpatient Hospital Stay: Payer: BC Managed Care – PPO

## 2021-01-22 ENCOUNTER — Inpatient Hospital Stay: Payer: BC Managed Care – PPO | Admitting: Hematology

## 2021-01-22 ENCOUNTER — Encounter: Payer: Self-pay | Admitting: *Deleted

## 2021-01-23 ENCOUNTER — Inpatient Hospital Stay: Payer: BC Managed Care – PPO | Attending: Hematology

## 2021-01-23 ENCOUNTER — Inpatient Hospital Stay (HOSPITAL_BASED_OUTPATIENT_CLINIC_OR_DEPARTMENT_OTHER): Payer: BC Managed Care – PPO | Admitting: Hematology

## 2021-01-23 ENCOUNTER — Encounter: Payer: Self-pay | Admitting: Hematology

## 2021-01-23 ENCOUNTER — Other Ambulatory Visit: Payer: Self-pay

## 2021-01-23 VITALS — BP 110/65 | HR 76 | Temp 98.8°F | Resp 18 | Ht 65.0 in | Wt 243.1 lb

## 2021-01-23 DIAGNOSIS — G43909 Migraine, unspecified, not intractable, without status migrainosus: Secondary | ICD-10-CM | POA: Insufficient documentation

## 2021-01-23 DIAGNOSIS — Z170 Estrogen receptor positive status [ER+]: Secondary | ICD-10-CM

## 2021-01-23 DIAGNOSIS — D500 Iron deficiency anemia secondary to blood loss (chronic): Secondary | ICD-10-CM

## 2021-01-23 DIAGNOSIS — C50211 Malignant neoplasm of upper-inner quadrant of right female breast: Secondary | ICD-10-CM | POA: Insufficient documentation

## 2021-01-23 DIAGNOSIS — Z9011 Acquired absence of right breast and nipple: Secondary | ICD-10-CM | POA: Insufficient documentation

## 2021-01-23 DIAGNOSIS — N920 Excessive and frequent menstruation with regular cycle: Secondary | ICD-10-CM | POA: Diagnosis not present

## 2021-01-23 DIAGNOSIS — Z79899 Other long term (current) drug therapy: Secondary | ICD-10-CM | POA: Diagnosis not present

## 2021-01-23 LAB — COMPREHENSIVE METABOLIC PANEL
ALT: 11 U/L (ref 0–44)
AST: 22 U/L (ref 15–41)
Albumin: 3.5 g/dL (ref 3.5–5.0)
Alkaline Phosphatase: 45 U/L (ref 38–126)
Anion gap: 9 (ref 5–15)
BUN: 7 mg/dL (ref 6–20)
CO2: 22 mmol/L (ref 22–32)
Calcium: 8.6 mg/dL — ABNORMAL LOW (ref 8.9–10.3)
Chloride: 110 mmol/L (ref 98–111)
Creatinine, Ser: 0.81 mg/dL (ref 0.44–1.00)
GFR, Estimated: >60 mL/min (ref >60)
Glucose, Bld: 101 mg/dL — ABNORMAL HIGH (ref 70–99)
Potassium: 3.6 mmol/L (ref 3.5–5.1)
Sodium: 141 mmol/L (ref 135–145)
Total Bilirubin: 0.5 mg/dL (ref 0.3–1.2)
Total Protein: 6.7 g/dL (ref 6.5–8.1)

## 2021-01-23 LAB — CBC WITH DIFFERENTIAL/PLATELET
Abs Immature Granulocytes: 0.01 10*3/uL (ref 0.00–0.07)
Basophils Absolute: 0 10*3/uL (ref 0.0–0.1)
Basophils Relative: 1 %
Eosinophils Absolute: 0.4 10*3/uL (ref 0.0–0.5)
Eosinophils Relative: 7 %
HCT: 29.6 % — ABNORMAL LOW (ref 36.0–46.0)
Hemoglobin: 9.7 g/dL — ABNORMAL LOW (ref 12.0–15.0)
Immature Granulocytes: 0 %
Lymphocytes Relative: 37 %
Lymphs Abs: 1.9 10*3/uL (ref 0.7–4.0)
MCH: 26.4 pg (ref 26.0–34.0)
MCHC: 32.8 g/dL (ref 30.0–36.0)
MCV: 80.7 fL (ref 80.0–100.0)
Monocytes Absolute: 0.4 10*3/uL (ref 0.1–1.0)
Monocytes Relative: 8 %
Neutro Abs: 2.5 10*3/uL (ref 1.7–7.7)
Neutrophils Relative %: 47 %
Platelets: 365 10*3/uL (ref 150–400)
RBC: 3.67 MIL/uL — ABNORMAL LOW (ref 3.87–5.11)
RDW: 18.7 % — ABNORMAL HIGH (ref 11.5–15.5)
WBC: 5.3 10*3/uL (ref 4.0–10.5)
nRBC: 0 % (ref 0.0–0.2)

## 2021-01-23 NOTE — Progress Notes (Signed)
[place]   [phone]) [phone] [phone]) [phone]   Clinic Follow up Note   Patient Care Team: Kristen Loader, FNP as PCP - General (Family Medicine) Mauro Kaufmann, RN as Oncology Nurse Navigator Connie Germany, RN as Oncology Nurse Navigator Rolm Bookbinder, MD as Consulting Physician (General Surgery) Truitt Merle, MD as Consulting Physician (Hematology) Gery Pray, MD as Consulting Physician (Radiation Oncology)  Date of Service:  01/23/2021  CHIEF COMPLAINT: f/u of right breast cancer, anemia  CURRENT THERAPY:  Tamoxifen, started neoadjuvantly on 10/18/20  ASSESSMENT & PLAN:  Connie Underwood is a 45 y.o. female with   1. Malignant neoplasm of upper-inner quadrant of right breast, invasive Ductal Carcinoma, pT2N56miM0, Stage IB, ER+/PR+/HER2-, Grade 2, Oncotype RS 19  -she presented with a palpable right breast mass. Biopsy on 10/06/20 revealed IDC with lobular features, grade 2, ER+, PR+, Her2-. -Oncotype DX was obtained on the biopsy sample and the recurrence score of 19 predicts a risk of recurrence outside the breast over the next 9 years of 6%, if the patient's only systemic therapy is an antiestrogen for 5 years.  It also predicts no benefit from chemotherapy. I reviewed with her -She underwent right mastectomy by Dr. Donne Hazel and tissue expander placement by Dr. Claudia Desanctis on 12/25/20. Pathology showed: IDC with lobular features, grade 2, spanning 3.1 cm; intermediate grade DCIS; margins and lymph nodes negative except one node containing isolated tumor cells -she started tamoxifen on 10/18/20. Tolerating well with hot flashes. She held this in anticipation of surgery. She will restart today. -I discussed ovarian suppression and switching tamoxifen to AI afterwards, which may further decrease her distant recurrence by 1-3%.  She does not plan to have more children.  I discussed the potential side effect of early menopause.  She will think about it.  Given  her early stage disease and low risk of recurrence, I do not feel strongly she needs to go on this.  -Survivorship clinic in 3 months  2. Iron deficient anemia  -She has chronic anemia for many years, secondary to iron deficiency from menorrhagia -Hgb 9.0 with low MCV 71.7 on 10/18/20, ferritin was low  -She received three doses of IV Venofer $RemoveBe'300mg'KSAQyslKf$  in 10/2020 and her anemia has improved.  She will receive one more dose anytime in the next 2 weeks. -Lab reviewed, slightly worsening anemia again today, will schedule I more dose IV Venofer   3. Genetic testing 10/24/20 -negative with VUS in Connie Underwood.   4. Migraine  -on meds as needed      Plan -resume tamoxifen tomorrow  -IV Venofer 300 mg next week -Lab including iron studies in a months -Survivorship with [name] in 3 months   No problem-specific Assessment & Plan notes found for this encounter.   SUMMARY OF ONCOLOGIC HISTORY: Oncology History Overview Note  Cancer Staging Malignant neoplasm of upper-inner quadrant of right breast in female, estrogen receptor positive ([place]) Staging form: Breast, AJCC 8th Edition - Clinical stage from 10/06/2020: Stage IB (cT2, cN0, cM0, G2, ER+, PR+, HER2-) - Signed by Truitt Merle, MD on 10/17/2020 Stage prefix: Initial diagnosis Histologic grading system: 3 grade system - Pathologic stage from 12/25/2020: No Stage Recommended (ypT2, pN65mi, cM0, G2, ER+, PR+, HER2-) - Signed by Truitt Merle, MD on 01/22/2021 Stage prefix: Post-therapy Histologic grading system: 3 grade system Residual tumor (R): R0 - None    Malignant neoplasm of upper-inner quadrant of right breast in female, estrogen receptor positive (Connie Underwood)  10/06/2020 Mammogram  DIGITAL DIAGNOSTIC BILATERAL MAMMOGRAM WITH TOMOSYNTHESIS AND CAD; ULTRASOUND RIGHT BREAST LIMITED  IMPRESSION: Two contiguous hypoechoic masses in the right breast- an irregular hypoechoic mass in the right breast at 12:30 3 cm from the nipple measuring 2.5 x 2.1 x 3.0 cm, and a  hypoechoic heterogeneous mass at 12:30 2 cm from the nipple measuring 3.0 x 1.6 x 2.8 cm. Sonographic evaluation of the right axilla does not show any enlarged adenopathy.   10/06/2020 Pathology Results   Diagnosis Breast, right, needle core biopsy - INVASIVE MAMMARY CARCINOMA WITH LOBULAR FEATURES - SEE COMMENT Microscopic Comment The biopsy material shows an infiltrative proliferation of cells with large vesicular nuclei with inconspicuous nucleoli, arranged linearly and in small clusters. Based on the biopsy, the carcinoma appears Nottingham grade 2 of 3 and measures 0.9 cm in greatest linear extent  E-cadherin is POSITIVE supporting a ductal origin.  PROGNOSTIC INDICATORS Results: IMMUNOHISTOCHEMICAL AND MORPHOMETRIC ANALYSIS PERFORMED MANUALLY The tumor cells are NEGATIVE for Her2 (1+). Estrogen Receptor: 80%, POSITIVE, STRONG STAINING INTENSITY Progesterone Receptor: 90%, POSITIVE, STRONG STAINING INTENSITY Proliferation Marker Ki67: 10%   10/06/2020 Cancer Staging   Staging form: Breast, AJCC 8th Edition - Clinical stage from 10/06/2020: Stage IB (cT2, cN0, cM0, G2, ER+, PR+, HER2-) - Signed by Truitt Merle, MD on 10/17/2020 Stage prefix: Initial diagnosis Histologic grading system: 3 grade system    10/06/2020 Oncotype testing   RS of 19, distant recurrence risk of 6%, chemotherapy not recommended   10/13/2020 Initial Diagnosis   Malignant neoplasm of upper-inner quadrant of right breast in female, estrogen receptor positive (Connie Underwood)   10/24/2020 Genetic Testing   Negative hereditary cancer genetic testing: no pathogenic variants detected in [facility] BRCAPlus Panel and [facility] CancerNext-Expanded +RNAinsight.  Variants of uncertain significance detected in Connie Underwood at p.Y2H (c.4T>C) and SUFU at  p.A138T (c.412G>A). The report dates are October 24, 2020 and October 27, 2020, respectively.    The BRCAplus panel offered by [employer] and includes sequencing and deletion/duplication analysis for the  following 8 genes: ATM, BRCA1, BRCA2, CDH1, CHEK2, PALB2, PTEN, and TP53.  The CancerNext-Expanded gene panel offered by [facility] and includes sequencing, rearrangement, and RNA analysis for the following 77 genes: AIP, ALK, APC, ATM, AXIN2, BAP1, BARD1, BLM, BMPR1A, BRCA1, BRCA2, BRIP1, CDC73, CDH1, CDK4, CDKN1B, CDKN2A, CHEK2, CTNNA1, DICER1, FANCC, FH, FLCN, GALNT12, KIF1B, LZTR1, MAX, MEN1, MET, MLH1, MSH2, MSH3, MSH6, MUTYH, NBN, NF1, NF2, NTHL1, PALB2, PHOX2B, PMS2, POT1, PRKAR1A, PTCH1, PTEN, RAD51C, RAD51D, RB1, RECQL, RET, SDHA, SDHAF2, SDHB, SDHC, SDHD, SMAD4, SMARCA4, SMARCB1, SMARCE1, STK11, SUFU, TMEM127, TP53, TSC1, TSC2, VHL and XRCC2 (sequencing and deletion/duplication); EGFR, EGLN1, HOXB13, KIT, MITF, PDGFRA, POLD1, and POLE (sequencing only); EPCAM and GREM1 (deletion/duplication only).    11/09/2020 Imaging   Breast MRI  IMPRESSION: 1. Known biopsy-proven invasive carcinoma within the upper inner quadrant of the RIGHT breast, measuring 3.6 cm, containing a biopsy clip. Contiguous linear non-mass enhancement extends superior-lateral to the spiculated mass, increasing overall craniocaudal dimension to 3 cm. 2. Additional irregular enhancing mass within the inner RIGHT breast, 3 o'clock axis region, at anterior depth, measuring 7 mm, with suspicious washout kinetics. MRI-guided biopsy is recommended to exclude multifocal disease. 3. Additional linear clumped non-mass enhancement within the upper inner quadrant of the LEFT breast, at posterior depth, measuring 1.8 cm extent, with suspicious washout kinetics. This may represent an intramammary lymph node. MRI-guided biopsy is recommended to exclude contralateral disease.   12/25/2020 Cancer Staging   Staging form: Breast, AJCC 8th Edition -  Pathologic stage from 12/25/2020: No Stage Recommended (ypT2, pN6mi, cM0, G2, ER+, PR+, HER2-) - Signed by Truitt Merle, MD on 01/22/2021 Stage prefix: Post-therapy Histologic grading system: 3  grade system Residual tumor (R): R0 - None    12/25/2020 Definitive Surgery   FINAL MICROSCOPIC DIAGNOSIS:   A. BREAST, RIGHT, MASTECTOMY:  - Invasive ductal carcinoma with lobular features, grade 2, spanning 3.1 cm.  - Intermediate grade ductal carcinoma in situ.  - Margins are negative for carcinoma.  - Lymphovascular invasion.  - Biopsy site.  - Fibroadenomatoid change.  - Pseudoangiomatous stromal hyperplasia.  - Intraductal papilloma.  - See oncology table.   B. LYMPH NODE, RIGHT AXILLARY, SENTINEL EXCISION:  - Isolated tumor cells in one of one lymph node (0/1).   C. LYMPH NODE, RIGHT AXILLARY, SENTINEL EXCISION:  - One of one lymph nodes negative for carcinoma. (0/1).      INTERVAL HISTORY:  ARIAHNA SMIDDY is here for a follow up of breast cancer and anemia. She was last seen by me on 11/24/20. She presents to the clinic alone.  She underwent right mastectomy and tissue expander placement on December 25, 2020.  She is recovering well from surgery, no significant pain from the incision.  Her appetite and energy level has recovered well, no other new complaints.   All other systems were reviewed with the patient and are negative.  MEDICAL HISTORY:  Past Medical History:  Diagnosis Date   Anemia    Anxiety    Breast cancer ([place])    Depression    Family history of prostate cancer 10/18/2020   Hx of migraines    Pre-diabetes    Seasonal allergic reaction     SURGICAL HISTORY: Past Surgical History:  Procedure Laterality Date   BREAST RECONSTRUCTION WITH PLACEMENT OF TISSUE EXPANDER AND FLEX HD (ACELLULAR HYDRATED DERMIS) Right 12/25/2020   Procedure: RIGHT BREAST RECONSTRUCTION WITH PLACEMENT OF TISSUE EXPANDER AND FLEX HD (ACELLULAR HYDRATED DERMIS);  Surgeon: Cindra Presume, MD;  Location: [facility];  Service: Plastics;  Laterality: Right;   MASTECTOMY W/ SENTINEL NODE BIOPSY Right 12/25/2020   Procedure: RIGHT MASTECTOMY WITH RIGHT AXILLARY SENTINEL LYMPH NODE  BIOPSY;  Surgeon: Rolm Bookbinder, MD;  Location: [facility];  Service: General;  Laterality: Right;   TUBAL LIGATION      I have reviewed the social history and family history with the patient and they are unchanged from previous note.  ALLERGIES:  is allergic to tomato and pollen extract.  MEDICATIONS:  Current Outpatient Medications  Medication Sig Dispense Refill   cetirizine (ZYRTEC ALLERGY) 10 MG tablet Take 1 tablet (10 mg total) by mouth daily. 90 tablet 0   EMGALITY 120 MG/ML SOAJ Inject 1 mL into the skin every 30 (thirty) days.     escitalopram (LEXAPRO) 10 MG tablet Take 10 mg by mouth daily.     fluticasone (FLONASE) 50 MCG/ACT nasal spray Place 2 sprays into both nostrils daily. 16 g 12   naproxen (NAPROSYN) 500 MG tablet Take 1 tablet (500 mg total) by mouth 2 (two) times daily with a meal. 30 tablet 0   pseudoephedrine (SUDAFED) 60 MG tablet Take 1 tablet (60 mg total) by mouth every 8 (eight) hours as needed for congestion. 30 tablet 0   SUMAtriptan (IMITREX) 50 MG tablet Take 50 mg by mouth every 2 (two) hours as needed for migraine.     tamoxifen (NOLVADEX) 20 MG tablet TAKE 1 TABLET BY MOUTH EVERY DAY 90 tablet 1  tiZANidine (ZANAFLEX) 4 MG tablet Take 1 tablet (4 mg total) by mouth at bedtime. 30 tablet 0   Vitamin D, Ergocalciferol, (DRISDOL) 1.25 MG (50000 UNIT) CAPS capsule Take 50,000 Units by mouth once a week.     No current facility-administered medications for this visit.    PHYSICAL EXAMINATION: ECOG PERFORMANCE STATUS: 0 - Asymptomatic  Vitals:   01/23/21 0959  BP: 110/65  Pulse: 76  Resp: 18  Temp: 98.8 F (37.1 C)  SpO2: 100%   Wt Readings from Last 3 Encounters:  01/23/21 243 lb 1.6 oz (110.3 kg)  12/25/20 235 lb (106.6 kg)  12/21/20 234 lb 11.2 oz (106.5 kg)     GENERAL:alert, no distress and comfortable SKIN: skin color, texture, turgor are normal, no rashes or significant lesions EYES: normal, Conjunctiva are pink and non-injected,  sclera clear NECK: supple, thyroid normal size, non-tender, without nodularity LYMPH:  no palpable lymphadenopathy in the cervical, axillary  LUNGS: clear to auscultation and percussion with normal breathing effort HEART: regular rate & rhythm and no murmurs and no lower extremity edema ABDOMEN:abdomen soft, non-tender and normal bowel sounds Musculoskeletal:no cyanosis of digits and no clubbing  NEURO: alert & oriented x 3 with fluent speech, no focal motor/sensory deficits BREAST: Status post right mastectomy and tissue expander placement.  Incision has healed well, no discharge or skin erythema.  No palpable mass, nodules or adenopathy bilaterally. Breast exam benign.   LABORATORY DATA:  I have reviewed the data as listed CBC Latest Ref Rng & Units 01/23/2021 12/21/2020 11/24/2020  WBC 4.0 - 10.5 K/uL 5.3 6.8 7.3  Hemoglobin 12.0 - 15.0 g/dL 9.7(L) 11.3(L) 11.1(L)  Hematocrit 36.0 - 46.0 % 29.6(L) 35.6(L) 35.6(L)  Platelets 150 - 400 K/uL 365 361 391     CMP Latest Ref Rng & Units 01/23/2021 12/21/2020 11/24/2020  Glucose 70 - 99 mg/dL 101(H) 107(H) 88  BUN 6 - 20 mg/dL 7 5(L) 8  Creatinine 0.44 - 1.00 mg/dL 0.81 0.87 0.85  Sodium 135 - 145 mmol/L 141 139 138  Potassium 3.5 - 5.1 mmol/L 3.6 3.3(L) 3.6  Chloride 98 - 111 mmol/L 110 108 109  CO2 22 - 32 mmol/L $RemoveB'22 23 23  'BnLCptGc$ Calcium 8.9 - 10.3 mg/dL 8.6(L) 8.6(L) 8.7(L)  Total Protein 6.5 - 8.1 g/dL 6.7 - 7.1  Total Bilirubin 0.3 - 1.2 mg/dL 0.5 - 0.7  Alkaline Phos 38 - 126 U/L 45 - 47  AST 15 - 41 U/L 22 - 14(L)  ALT 0 - 44 U/L 11 - 10      RADIOGRAPHIC STUDIES: I have personally reviewed the radiological images as listed and agreed with the findings in the report. No results found.    Orders Placed This Encounter  Procedures   Ferritin    Standing Status:   Standing    Number of Occurrences:   20    Standing Expiration Date:   01/23/2022   Iron and TIBC    Standing Status:   Standing    Number of Occurrences:   20     Standing Expiration Date:   01/23/2022    All questions were answered. The patient knows to call the clinic with any problems, questions or concerns. No barriers to learning was detected. The total time spent in the appointment was 30 minutes.     Truitt Merle, MD 01/23/2021   I, Wilburn Mylar, am acting as scribe for Truitt Merle, MD.   I have reviewed the above documentation for accuracy and completeness,  and I agree with the above.

## 2021-01-24 ENCOUNTER — Encounter: Payer: Self-pay | Admitting: *Deleted

## 2021-01-24 DIAGNOSIS — C50211 Malignant neoplasm of upper-inner quadrant of right female breast: Secondary | ICD-10-CM

## 2021-01-25 ENCOUNTER — Ambulatory Visit (INDEPENDENT_AMBULATORY_CARE_PROVIDER_SITE_OTHER): Payer: BC Managed Care – PPO | Admitting: Surgical

## 2021-01-25 ENCOUNTER — Other Ambulatory Visit: Payer: Self-pay

## 2021-01-25 ENCOUNTER — Encounter: Payer: Self-pay | Admitting: Surgical

## 2021-01-25 DIAGNOSIS — C50211 Malignant neoplasm of upper-inner quadrant of right female breast: Secondary | ICD-10-CM

## 2021-01-25 DIAGNOSIS — Z170 Estrogen receptor positive status [ER+]: Secondary | ICD-10-CM

## 2021-01-25 NOTE — Progress Notes (Signed)
Patient is a 45 year old female here for follow-up on her right breast reconstruction.  She had expanders placed with Dr. Claudia Desanctis on 12/25/2020.  She is 1 month postop.  Chaperone present on exam On exam right breast incision is intact.  There is no erythema or cellulitic changes noted.  No subcutaneous fluid collection noted with palpation.  Along the lateral portion of the incision there is a little bit of superficial skin loss.   We placed injectable saline in the Expander using a sterile technique: Right: 100 cc for a total of 450 / 450 cc  She is close to her exchange size, would like to start looking for a date to exchange this year. Pictures were obtained of the patient and placed in the chart with the patient's or guardian's permission. Recommend following up in 2 weeks for reevaluation and to discuss possible additional fill. She is interested in left breast mastopexy/reduction for symmetry.  With the current volume in her right breast tissue expander she is fairly symmetric and may not need any additional fills.  Will discuss with Dr. Claudia Desanctis.

## 2021-01-29 ENCOUNTER — Other Ambulatory Visit: Payer: Self-pay | Admitting: Hematology

## 2021-01-30 ENCOUNTER — Other Ambulatory Visit: Payer: Self-pay

## 2021-01-30 ENCOUNTER — Inpatient Hospital Stay: Payer: BC Managed Care – PPO | Attending: Hematology

## 2021-01-30 VITALS — BP 114/54 | HR 72 | Temp 98.9°F | Resp 16

## 2021-01-30 DIAGNOSIS — D500 Iron deficiency anemia secondary to blood loss (chronic): Secondary | ICD-10-CM | POA: Diagnosis not present

## 2021-01-30 DIAGNOSIS — N920 Excessive and frequent menstruation with regular cycle: Secondary | ICD-10-CM | POA: Insufficient documentation

## 2021-01-30 MED ORDER — SODIUM CHLORIDE 0.9 % IV SOLN
300.0000 mg | Freq: Once | INTRAVENOUS | Status: AC
  Administered 2021-01-30: 300 mg via INTRAVENOUS
  Filled 2021-01-30: qty 300

## 2021-01-30 MED ORDER — LORATADINE 10 MG PO TABS
10.0000 mg | ORAL_TABLET | Freq: Once | ORAL | Status: AC
  Administered 2021-01-30: 10 mg via ORAL
  Filled 2021-01-30: qty 1

## 2021-01-30 NOTE — Progress Notes (Signed)
Patient did not want to wait 30 minutes post infusion for observation. Vitals WNL. Patient reported no problems. No s/s of adverse reaction noted.

## 2021-01-30 NOTE — Patient Instructions (Signed)

## 2021-01-31 ENCOUNTER — Inpatient Hospital Stay: Payer: BC Managed Care – PPO

## 2021-01-31 ENCOUNTER — Telehealth: Payer: Self-pay | Admitting: Plastic Surgery

## 2021-01-31 ENCOUNTER — Encounter: Payer: Self-pay | Admitting: Hematology

## 2021-01-31 NOTE — Telephone Encounter (Signed)
Returned patients call. She is complaining about her right breast has been hurting along with feeling tight since the last fill 01/25/2021. Her expander is over the muscle. The next follow up appointment is 02/08/2021 with [facility]. I mentioned to her that we could possibly withdrawal some of the saline out from her implant, but will be need to be discussed with [facility] and observed.  Should she become more uncomfortable until her next appointment, call our office. May alternate IBU and Tylenol if needed. Patient understood and agreed with plan.

## 2021-01-31 NOTE — Progress Notes (Signed)
Met with patient at registration to complete grant process.  Patient approved for one-time $1000 [employer] grant to assist with personal expenses while going through treatment. Discussed in detail expenses and how they are covered. She has a copy of the approval letter and expense sheet along with the Outpatient pharmacy information. She received a gift card today from her grant.  She has my card for any additional financial questions or concerns along with paperwork in green folder.

## 2021-01-31 NOTE — Telephone Encounter (Signed)
PLEASE FOLLOW UP Patient called and mentioned that she was experiencing pain from the fill she received in her expanders 10/27. Consulted Dr. Aris Everts and he said it is normal to experience pain for a few day but he asked if clinical staff could follow up with her to check in.  Patient's number: [phone]  Thank You

## 2021-02-08 ENCOUNTER — Other Ambulatory Visit: Payer: Self-pay

## 2021-02-08 ENCOUNTER — Ambulatory Visit (INDEPENDENT_AMBULATORY_CARE_PROVIDER_SITE_OTHER): Payer: BC Managed Care – PPO | Admitting: Surgical

## 2021-02-08 DIAGNOSIS — Z170 Estrogen receptor positive status [ER+]: Secondary | ICD-10-CM

## 2021-02-08 DIAGNOSIS — C50211 Malignant neoplasm of upper-inner quadrant of right female breast: Secondary | ICD-10-CM

## 2021-02-08 NOTE — Progress Notes (Signed)
Patient is a 45 year old female here for follow-up on her right breast reconstruction.  She underwent expander placement with Dr. Claudia Desanctis on 12/25/2020.  She is doing well.  She reports that she is comfortable with the current size of her right breast expander and would not like any additional fills.  She is interested in planning for exchange of the right breast tissue expander for implant and left breast lift/reduction for symmetry.  She would like to have this this year if possible.  Chaperone present on exam On exam right breast incision is intact, healing well.  No subcutaneous fluid collections noted.  No erythema noted.  We discussed planning for exchange of right breast tissue expander for right breast silicone implant and left breast mastopexy for symmetry.  Patient would like to have this done in December if possible.  She was provided with the [employer] implant consent form/checklist, general surgery checklist/consent form and implant tracking forms to complete prior to her preop appointment.  I discussed with her that I would discuss with Dr. Claudia Desanctis in our surgical scheduling team and she would hear from our surgical scheduling team soon to discuss scheduling.  All of her questions were answered to her content.  We have photos in her chart from her previous visit.  She currently has 450cc out of 450 cc in her right breast tissue expander.

## 2021-02-15 ENCOUNTER — Other Ambulatory Visit: Payer: Self-pay

## 2021-02-15 ENCOUNTER — Inpatient Hospital Stay (HOSPITAL_BASED_OUTPATIENT_CLINIC_OR_DEPARTMENT_OTHER): Payer: BC Managed Care – PPO

## 2021-02-15 DIAGNOSIS — C50211 Malignant neoplasm of upper-inner quadrant of right female breast: Secondary | ICD-10-CM

## 2021-02-15 DIAGNOSIS — D500 Iron deficiency anemia secondary to blood loss (chronic): Secondary | ICD-10-CM | POA: Diagnosis not present

## 2021-02-15 DIAGNOSIS — R0981 Nasal congestion: Secondary | ICD-10-CM | POA: Diagnosis not present

## 2021-02-15 DIAGNOSIS — J320 Chronic maxillary sinusitis: Secondary | ICD-10-CM | POA: Diagnosis not present

## 2021-02-15 DIAGNOSIS — N920 Excessive and frequent menstruation with regular cycle: Secondary | ICD-10-CM | POA: Diagnosis not present

## 2021-02-15 LAB — CBC WITH DIFFERENTIAL/PLATELET
Abs Immature Granulocytes: 0.01 10*3/uL (ref 0.00–0.07)
Basophils Absolute: 0 10*3/uL (ref 0.0–0.1)
Basophils Relative: 1 %
Eosinophils Absolute: 0.5 10*3/uL (ref 0.0–0.5)
Eosinophils Relative: 7 %
HCT: 36.3 % (ref 36.0–46.0)
Hemoglobin: 11.4 g/dL — ABNORMAL LOW (ref 12.0–15.0)
Immature Granulocytes: 0 %
Lymphocytes Relative: 28 %
Lymphs Abs: 1.7 10*3/uL (ref 0.7–4.0)
MCH: 26.4 pg (ref 26.0–34.0)
MCHC: 31.4 g/dL (ref 30.0–36.0)
MCV: 84 fL (ref 80.0–100.0)
Monocytes Absolute: 0.6 10*3/uL (ref 0.1–1.0)
Monocytes Relative: 9 %
Neutro Abs: 3.5 10*3/uL (ref 1.7–7.7)
Neutrophils Relative %: 55 %
Platelets: 362 10*3/uL (ref 150–400)
RBC: 4.32 MIL/uL (ref 3.87–5.11)
RDW: 16.6 % — ABNORMAL HIGH (ref 11.5–15.5)
WBC: 6.3 10*3/uL (ref 4.0–10.5)
nRBC: 0 % (ref 0.0–0.2)

## 2021-02-15 LAB — COMPREHENSIVE METABOLIC PANEL
ALT: 10 U/L (ref 0–44)
AST: 14 U/L — ABNORMAL LOW (ref 15–41)
Albumin: 3.7 g/dL (ref 3.5–5.0)
Alkaline Phosphatase: 51 U/L (ref 38–126)
Anion gap: 9 (ref 5–15)
BUN: 7 mg/dL (ref 6–20)
CO2: 24 mmol/L (ref 22–32)
Calcium: 8.6 mg/dL — ABNORMAL LOW (ref 8.9–10.3)
Chloride: 108 mmol/L (ref 98–111)
Creatinine, Ser: 0.83 mg/dL (ref 0.44–1.00)
GFR, Estimated: >60 mL/min (ref >60)
Glucose, Bld: 97 mg/dL (ref 70–99)
Potassium: 3.6 mmol/L (ref 3.5–5.1)
Sodium: 141 mmol/L (ref 135–145)
Total Bilirubin: 0.3 mg/dL (ref 0.3–1.2)
Total Protein: 7 g/dL (ref 6.5–8.1)

## 2021-02-15 LAB — IRON AND TIBC
Iron: 37 ug/dL — ABNORMAL LOW (ref 41–142)
Saturation Ratios: 11 % — ABNORMAL LOW (ref 21–57)
TIBC: 328 ug/dL (ref 236–444)
UIBC: 291 ug/dL (ref 120–384)

## 2021-02-15 LAB — FERRITIN: Ferritin: 63 ng/mL (ref 11–307)

## 2021-02-18 ENCOUNTER — Encounter: Payer: Self-pay | Admitting: Hematology

## 2021-02-26 ENCOUNTER — Ambulatory Visit (INDEPENDENT_AMBULATORY_CARE_PROVIDER_SITE_OTHER): Payer: BC Managed Care – PPO | Admitting: Physician Assistant

## 2021-02-26 ENCOUNTER — Other Ambulatory Visit: Payer: Self-pay

## 2021-02-26 VITALS — BP 125/79 | HR 68 | Ht 64.5 in | Wt 242.0 lb

## 2021-02-26 DIAGNOSIS — Z719 Counseling, unspecified: Secondary | ICD-10-CM

## 2021-02-26 DIAGNOSIS — Z170 Estrogen receptor positive status [ER+]: Secondary | ICD-10-CM

## 2021-02-26 DIAGNOSIS — C50211 Malignant neoplasm of upper-inner quadrant of right female breast: Secondary | ICD-10-CM

## 2021-02-26 MED ORDER — HYDROCODONE-ACETAMINOPHEN 5-325 MG PO TABS: 1.0000 | ORAL_TABLET | Freq: Four times a day (QID) | ORAL | 0 refills | Status: AC | PRN

## 2021-02-26 MED ORDER — SULFAMETHOXAZOLE-TRIMETHOPRIM 800-160 MG PO TABS: 1.0000 | ORAL_TABLET | Freq: Two times a day (BID) | ORAL | 0 refills | Status: AC

## 2021-02-26 NOTE — H&P (View-Only) (Signed)
Patient ID: Connie Underwood, female    DOB: 1975/05/20, 45 y.o.   MRN: [id number]  Chief Complaint  Patient presents with   Pre-op Exam      ICD-10-CM   1. Malignant neoplasm of upper-inner quadrant of right breast in female, estrogen receptor positive ([place])  C50.211    Z17.0        History of Present Illness: Connie Underwood is a 45 y.o.  female  with a history of right-sided breast cancer s/p expander placement.  She presents for preoperative evaluation for upcoming procedure, right-sided implant exchange with left-sided mastopexy, scheduled for 03/12/2021 with Dr. Claudia Desanctis.  The patient has not had problems with anesthesia.  She was on tamoxifen for her breast cancer, but her oncologist has already instructed her to discontinue 2 weeks before surgery to mitigate her risk of clots.  She does not smoke tobacco.  Prediabetic, most recent A1c 5.4%.  She reports that she was about a C cup before her cancer diagnosis, would like to remain about the same size.  Patient notes that she has some right-sided superior pole flattening relative to left side that she is hopeful will be addressed during implant exchange.  She denies any personal or family history of blood clots or clotting disorder.  She also endorses a history of anemia, but has been receiving iron infusions and most recent hemoglobin obtained 11 days ago was 11.4.  Lastly, she states that she has a history of maxillary sinusitis and was treated for sinus infection with Augmentin 2 weeks ago, denies any ongoing symptoms.  Summary of Previous Visit: She was last seen here in clinic 02/08/2021 for expander fill.  She has 450 / 450 cc right breast tissue expander.  Recent photos obtained 12/2020.  Job: Works at a call center, 3 weeks off of work.  PMH Significant for: Right-sided breast cancer, maxillary sinusitis, iron deficiency anemia.   Past Medical History: Allergies: Allergies  Allergen Reactions   Tomato     Hives    Pollen Extract Other (See Comments)    Seasonal allergies     Current Medications:  Current Outpatient Medications:    cetirizine (ZYRTEC ALLERGY) 10 MG tablet, Take 1 tablet (10 mg total) by mouth daily., Disp: 90 tablet, Rfl: 0   EMGALITY 120 MG/ML SOAJ, Inject 1 mL into the skin every 30 (thirty) days., Disp: , Rfl:    escitalopram (LEXAPRO) 10 MG tablet, Take 10 mg by mouth daily., Disp: , Rfl:    fluticasone (FLONASE) 50 MCG/ACT nasal spray, Place 2 sprays into both nostrils daily., Disp: 16 g, Rfl: 12   naproxen (NAPROSYN) 500 MG tablet, Take 1 tablet (500 mg total) by mouth 2 (two) times daily with a meal., Disp: 30 tablet, Rfl: 0   pseudoephedrine (SUDAFED) 60 MG tablet, Take 1 tablet (60 mg total) by mouth every 8 (eight) hours as needed for congestion., Disp: 30 tablet, Rfl: 0   SUMAtriptan (IMITREX) 50 MG tablet, Take 50 mg by mouth every 2 (two) hours as needed for migraine., Disp: , Rfl:    tamoxifen (NOLVADEX) 20 MG tablet, TAKE 1 TABLET BY MOUTH EVERY DAY, Disp: 90 tablet, Rfl: 1   tamoxifen (NOLVADEX) 20 MG tablet, Take 1 tablet by mouth daily., Disp: , Rfl:    tiZANidine (ZANAFLEX) 4 MG tablet, Take 1 tablet (4 mg total) by mouth at bedtime., Disp: 30 tablet, Rfl: 0   Vitamin D, Ergocalciferol, (DRISDOL) 1.25 MG (50000 UNIT) CAPS capsule, Take  50,000 Units by mouth once a week., Disp: , Rfl:   Past Medical Problems: Past Medical History:  Diagnosis Date   Anemia    Anxiety    Breast cancer ([place])    Depression    Family history of prostate cancer 10/18/2020   Hx of migraines    Pre-diabetes    Seasonal allergic reaction     Past Surgical History: Past Surgical History:  Procedure Laterality Date   BREAST RECONSTRUCTION WITH PLACEMENT OF TISSUE EXPANDER AND FLEX HD (ACELLULAR HYDRATED DERMIS) Right 12/25/2020   Procedure: RIGHT BREAST RECONSTRUCTION WITH PLACEMENT OF TISSUE EXPANDER AND FLEX HD (ACELLULAR HYDRATED DERMIS);  Surgeon: Cindra Presume, MD;  Location: [facility];  Service: Plastics;  Laterality: Right;   MASTECTOMY W/ SENTINEL NODE BIOPSY Right 12/25/2020   Procedure: RIGHT MASTECTOMY WITH RIGHT AXILLARY SENTINEL LYMPH NODE BIOPSY;  Surgeon: Rolm Bookbinder, MD;  Location: [place];  Service: General;  Laterality: Right;   TUBAL LIGATION      Social History: Social History   Socioeconomic History   Marital status: Single    Spouse name: Not on file   Number of children: 4   Years of education: Not on file   Highest education level: Not on file  Occupational History   Not on file  Tobacco Use   Smoking status: Never   Smokeless tobacco: Never  Vaping Use   Vaping Use: Never used  Substance and Sexual Activity   Alcohol use: No   Drug use: No   Sexual activity: Not on file  Other Topics Concern   Not on file  Social History Narrative   Not on file   Social Determinants of Health   Financial Resource Strain: High Risk   Difficulty of Paying Living Expenses: Very hard  Food Insecurity: Food Insecurity Present   Worried About Charity fundraiser in the Last Year: Often true   Arboriculturist in the Last Year: Often true  Transportation Needs: Public librarian (Medical): Yes   Lack of Transportation (Non-Medical): Yes  Physical Activity: Not on file  Stress: Stress Concern Present   Feeling of Stress : Very much  Social Connections: Not on file  Intimate Partner Violence: Not on file    Family History: Family History  Problem Relation Age of Onset   Prostate cancer Maternal Uncle        dx after 29   Cancer Maternal Uncle        unknown type dx after 50; prostate? lung?   Prostate cancer Paternal Uncle        3 paternal uncles with prostate cancer dx after 31   Prostate cancer Paternal Grandfather        dx after 77    Review of Systems: ROS Endorses recently being treated for maxillary sinus congestion with antibiotics.  Reports improvement.  Denies recent fevers, chills, ongoing  congestion issues, or other symptoms.  Physical Exam: Vital Signs BP 125/79 (BP Location: Left Arm, Patient Position: Sitting, Cuff Size: Large)   Pulse 68   Ht 5' 4.5" (1.638 m)   Wt 242 lb (109.8 kg)   SpO2 98%   BMI 40.90 kg/m   Physical Exam Constitutional:      General: Not in acute distress.    Appearance: Normal appearance. Not ill-appearing.  HENT:     Head: Normocephalic and atraumatic.  Eyes:     Pupils: Pupils are equal, round Neck:  Musculoskeletal: Normal range of motion.  Cardiovascular:     Rate and Rhythm: Normal rate    Pulses: Normal pulses.  Pulmonary:     Effort: Pulmonary effort is normal. No respiratory distress.  Abdominal:     General: Abdomen is flat. There is no distension.  Musculoskeletal: Normal range of motion.  No lower extremity swelling or edema.  No obvious varicosities noted.  Ambulatory. Skin:    General: Skin is warm and dry.     Findings: No erythema or rash.  Neurological:     General: No focal deficit present.     Mental Status: Alert and oriented to person, place, and time. Mental status is at baseline.     Motor: No weakness.  Psychiatric:        Mood and Affect: Mood normal.        Behavior: Behavior normal.    Assessment/Plan: The patient is scheduled for right side implant exchange with left-sided mastopexy 03/12/2021 with Dr. Claudia Desanctis.  Risks, benefits, and alternatives of procedure discussed, questions answered and consent obtained.    Smoking Status: Non-smoker. Last Mammogram: S/p right-sided mastectomy with reconstruction.  Caprini Score: 6; Risk Factors include: Age, BMI greater than 25, breast cancer, and length of planned surgery. Recommendation for mechanical prophylaxis. Encourage early ambulation.  She is holding her tamoxifen and is not on any other hormone therapy.  Pictures obtained: 01/25/2021.  Post-op Rx sent to pharmacy: Norco, Bactrim.  Patient reports having leftover Zofran at home.  Patient was  provided with the General Surgical Risk consent document and Pain Medication Agreement prior to their appointment.  They had adequate time to read through the risk consent documents and Pain Medication Agreement. We also discussed them in person together during this preop appointment. All of their questions were answered to their satisfaction.  Recommended calling if they have any further questions.  Risk consent form and Pain Medication Agreement to be scanned into patient's chart.  The risks that can be encountered with and after placement of a breast implant placement were discussed and include the following but not limited to these: bleeding, infection, delayed healing, anesthesia risks, skin sensation changes, injury to structures including nerves, blood vessels, and muscles which may be temporary or permanent, allergies to tape, suture materials and glues, blood products, topical preparations or injected agents, skin contour irregularities, skin discoloration and swelling, deep vein thrombosis, cardiac and pulmonary complications, pain, which may persist, fluid accumulation, wrinkling of the skin over the implant, changes in nipple or breast sensation, implant leakage or rupture, faulty position of the implant, persistent pain, formation of tight scar tissue around the implant (capsular contracture), possible need for revisional surgery or staged procedures.  The risk that can be encountered with mastopexy/breast lift were discussed and include the following but not limited to these:  Breast asymmetry, fluid accumulation, firmness of the breast, inability to breast feed, loss of nipple or areola, skin loss, decrease or no nipple sensation, fat necrosis of the breast tissue, bleeding, infection, healing delay.  There are risks of anesthesia, changes to skin sensation and injury to nerves or blood vessels.  The muscle can be temporarily or permanently injured.  You may have an allergic reaction to tape,  suture, glue, blood products which can result in skin discoloration, swelling, pain, skin lesions, poor healing.  Any of these can lead to the need for revisonal surgery or stage procedures.  A mastopexy has potential to interfere with diagnostic procedures.  Nipple or breast piercing  can increase risks of infection.  This procedure is best done when the breast is fully developed.  Changes in the breast will continue to occur over time.  Pregnancy can alter the outcomes of previous breast surgery, weight gain and weigh loss can also effect the long term appearance.     Electronically signed by: Krista Blue, PA-C 02/26/2021 3:55 PM

## 2021-02-26 NOTE — Progress Notes (Signed)
Patient ID: Connie Underwood, female    DOB: 03/16/1976, 45 y.o.   MRN: [id number]  Chief Complaint  Patient presents with   Pre-op Exam      ICD-10-CM   1. Malignant neoplasm of upper-inner quadrant of right breast in female, estrogen receptor positive ([place])  C50.211    Z17.0        History of Present Illness: ASPASIA Underwood is a 45 y.o.  female  with a history of right-sided breast cancer s/p expander placement.  She presents for preoperative evaluation for upcoming procedure, right-sided implant exchange with left-sided mastopexy, scheduled for 03/12/2021 with Dr. Claudia Desanctis.  The patient has not had problems with anesthesia.  She was on tamoxifen for her breast cancer, but her oncologist has already instructed her to discontinue 2 weeks before surgery to mitigate her risk of clots.  She does not smoke tobacco.  Prediabetic, most recent A1c 5.4%.  She reports that she was about a C cup before her cancer diagnosis, would like to remain about the same size.  Patient notes that she has some right-sided superior pole flattening relative to left side that she is hopeful will be addressed during implant exchange.  She denies any personal or family history of blood clots or clotting disorder.  She also endorses a history of anemia, but has been receiving iron infusions and most recent hemoglobin obtained 11 days ago was 11.4.  Lastly, she states that she has a history of maxillary sinusitis and was treated for sinus infection with Augmentin 2 weeks ago, denies any ongoing symptoms.  Summary of Previous Visit: She was last seen here in clinic 02/08/2021 for expander fill.  She has 450 / 450 cc right breast tissue expander.  Recent photos obtained 12/2020.  Job: Works at a call center, 3 weeks off of work.  PMH Significant for: Right-sided breast cancer, maxillary sinusitis, iron deficiency anemia.   Past Medical History: Allergies: Allergies  Allergen Reactions   Tomato     Hives    Pollen Extract Other (See Comments)    Seasonal allergies     Current Medications:  Current Outpatient Medications:    cetirizine (ZYRTEC ALLERGY) 10 MG tablet, Take 1 tablet (10 mg total) by mouth daily., Disp: 90 tablet, Rfl: 0   EMGALITY 120 MG/ML SOAJ, Inject 1 mL into the skin every 30 (thirty) days., Disp: , Rfl:    escitalopram (LEXAPRO) 10 MG tablet, Take 10 mg by mouth daily., Disp: , Rfl:    fluticasone (FLONASE) 50 MCG/ACT nasal spray, Place 2 sprays into both nostrils daily., Disp: 16 g, Rfl: 12   naproxen (NAPROSYN) 500 MG tablet, Take 1 tablet (500 mg total) by mouth 2 (two) times daily with a meal., Disp: 30 tablet, Rfl: 0   pseudoephedrine (SUDAFED) 60 MG tablet, Take 1 tablet (60 mg total) by mouth every 8 (eight) hours as needed for congestion., Disp: 30 tablet, Rfl: 0   SUMAtriptan (IMITREX) 50 MG tablet, Take 50 mg by mouth every 2 (two) hours as needed for migraine., Disp: , Rfl:    tamoxifen (NOLVADEX) 20 MG tablet, TAKE 1 TABLET BY MOUTH EVERY DAY, Disp: 90 tablet, Rfl: 1   tamoxifen (NOLVADEX) 20 MG tablet, Take 1 tablet by mouth daily., Disp: , Rfl:    tiZANidine (ZANAFLEX) 4 MG tablet, Take 1 tablet (4 mg total) by mouth at bedtime., Disp: 30 tablet, Rfl: 0   Vitamin D, Ergocalciferol, (DRISDOL) 1.25 MG (50000 UNIT) CAPS capsule, Take  50,000 Units by mouth once a week., Disp: , Rfl:   Past Medical Problems: Past Medical History:  Diagnosis Date   Anemia    Anxiety    Breast cancer ([place])    Depression    Family history of prostate cancer 10/18/2020   Hx of migraines    Pre-diabetes    Seasonal allergic reaction     Past Surgical History: Past Surgical History:  Procedure Laterality Date   BREAST RECONSTRUCTION WITH PLACEMENT OF TISSUE EXPANDER AND FLEX HD (ACELLULAR HYDRATED DERMIS) Right 12/25/2020   Procedure: RIGHT BREAST RECONSTRUCTION WITH PLACEMENT OF TISSUE EXPANDER AND FLEX HD (ACELLULAR HYDRATED DERMIS);  Surgeon: Cindra Presume, MD;  Location: [facility];  Service: Plastics;  Laterality: Right;   MASTECTOMY W/ SENTINEL NODE BIOPSY Right 12/25/2020   Procedure: RIGHT MASTECTOMY WITH RIGHT AXILLARY SENTINEL LYMPH NODE BIOPSY;  Surgeon: Rolm Bookbinder, MD;  Location: [facility];  Service: General;  Laterality: Right;   TUBAL LIGATION      Social History: Social History   Socioeconomic History   Marital status: Single    Spouse name: Not on file   Number of children: 4   Years of education: Not on file   Highest education level: Not on file  Occupational History   Not on file  Tobacco Use   Smoking status: Never   Smokeless tobacco: Never  Vaping Use   Vaping Use: Never used  Substance and Sexual Activity   Alcohol use: No   Drug use: No   Sexual activity: Not on file  Other Topics Concern   Not on file  Social History Narrative   Not on file   Social Determinants of Health   Financial Resource Strain: High Risk   Difficulty of Paying Living Expenses: Very hard  Food Insecurity: Food Insecurity Present   Worried About Charity fundraiser in the Last Year: Often true   Arboriculturist in the Last Year: Often true  Transportation Needs: Public librarian (Medical): Yes   Lack of Transportation (Non-Medical): Yes  Physical Activity: Not on file  Stress: Stress Concern Present   Feeling of Stress : Very much  Social Connections: Not on file  Intimate Partner Violence: Not on file    Family History: Family History  Problem Relation Age of Onset   Prostate cancer Maternal Uncle        dx after 29   Cancer Maternal Uncle        unknown type dx after 50; prostate? lung?   Prostate cancer Paternal Uncle        3 paternal uncles with prostate cancer dx after 30   Prostate cancer Paternal Grandfather        dx after 13    Review of Systems: ROS Endorses recently being treated for maxillary sinus congestion with antibiotics.  Reports improvement.  Denies recent fevers, chills, ongoing  congestion issues, or other symptoms.  Physical Exam: Vital Signs BP 125/79 (BP Location: Left Arm, Patient Position: Sitting, Cuff Size: Large)   Pulse 68   Ht 5' 4.5" (1.638 m)   Wt 242 lb (109.8 kg)   SpO2 98%   BMI 40.90 kg/m   Physical Exam Constitutional:      General: Not in acute distress.    Appearance: Normal appearance. Not ill-appearing.  HENT:     Head: Normocephalic and atraumatic.  Eyes:     Pupils: Pupils are equal, round Neck:  Musculoskeletal: Normal range of motion.  Cardiovascular:     Rate and Rhythm: Normal rate    Pulses: Normal pulses.  Pulmonary:     Effort: Pulmonary effort is normal. No respiratory distress.  Abdominal:     General: Abdomen is flat. There is no distension.  Musculoskeletal: Normal range of motion.  No lower extremity swelling or edema.  No obvious varicosities noted.  Ambulatory. Skin:    General: Skin is warm and dry.     Findings: No erythema or rash.  Neurological:     General: No focal deficit present.     Mental Status: Alert and oriented to person, place, and time. Mental status is at baseline.     Motor: No weakness.  Psychiatric:        Mood and Affect: Mood normal.        Behavior: Behavior normal.    Assessment/Plan: The patient is scheduled for right side implant exchange with left-sided mastopexy 03/12/2021 with Dr. Claudia Desanctis.  Risks, benefits, and alternatives of procedure discussed, questions answered and consent obtained.    Smoking Status: Non-smoker. Last Mammogram: S/p right-sided mastectomy with reconstruction.  Caprini Score: 6; Risk Factors include: Age, BMI greater than 25, breast cancer, and length of planned surgery. Recommendation for mechanical prophylaxis. Encourage early ambulation.  She is holding her tamoxifen and is not on any other hormone therapy.  Pictures obtained: 01/25/2021.  Post-op Rx sent to pharmacy: Norco, Bactrim.  Patient reports having leftover Zofran at home.  Patient was  provided with the General Surgical Risk consent document and Pain Medication Agreement prior to their appointment.  They had adequate time to read through the risk consent documents and Pain Medication Agreement. We also discussed them in person together during this preop appointment. All of their questions were answered to their satisfaction.  Recommended calling if they have any further questions.  Risk consent form and Pain Medication Agreement to be scanned into patient's chart.  The risks that can be encountered with and after placement of a breast implant placement were discussed and include the following but not limited to these: bleeding, infection, delayed healing, anesthesia risks, skin sensation changes, injury to structures including nerves, blood vessels, and muscles which may be temporary or permanent, allergies to tape, suture materials and glues, blood products, topical preparations or injected agents, skin contour irregularities, skin discoloration and swelling, deep vein thrombosis, cardiac and pulmonary complications, pain, which may persist, fluid accumulation, wrinkling of the skin over the implant, changes in nipple or breast sensation, implant leakage or rupture, faulty position of the implant, persistent pain, formation of tight scar tissue around the implant (capsular contracture), possible need for revisional surgery or staged procedures.  The risk that can be encountered with mastopexy/breast lift were discussed and include the following but not limited to these:  Breast asymmetry, fluid accumulation, firmness of the breast, inability to breast feed, loss of nipple or areola, skin loss, decrease or no nipple sensation, fat necrosis of the breast tissue, bleeding, infection, healing delay.  There are risks of anesthesia, changes to skin sensation and injury to nerves or blood vessels.  The muscle can be temporarily or permanently injured.  You may have an allergic reaction to tape,  suture, glue, blood products which can result in skin discoloration, swelling, pain, skin lesions, poor healing.  Any of these can lead to the need for revisonal surgery or stage procedures.  A mastopexy has potential to interfere with diagnostic procedures.  Nipple or breast piercing  can increase risks of infection.  This procedure is best done when the breast is fully developed.  Changes in the breast will continue to occur over time.  Pregnancy can alter the outcomes of previous breast surgery, weight gain and weigh loss can also effect the long term appearance.     Electronically signed by: Krista Blue, PA-C 02/26/2021 3:55 PM

## 2021-03-05 ENCOUNTER — Encounter: Payer: Self-pay | Admitting: Hematology

## 2021-03-08 ENCOUNTER — Encounter (HOSPITAL_COMMUNITY): Payer: Self-pay | Admitting: Plastic Surgery

## 2021-03-09 ENCOUNTER — Encounter (HOSPITAL_COMMUNITY): Payer: Self-pay | Admitting: Plastic Surgery

## 2021-03-09 NOTE — Progress Notes (Signed)
DUE TO COVID-19 ONLY ONE VISITOR IS ALLOWED TO COME WITH YOU AND STAY IN THE WAITING ROOM ONLY DURING PRE OP AND PROCEDURE DAY OF SURGERY.   PCP - Jillyn Ledger, FNP Cardiologist - n/a Oncology - Dr Truitt Merle  Chest x-ray - n/a EKG - 12/21/20 Stress Test - n/a ECHO - n/a Cardiac Cath - n/a  ICD Pacemaker/Loop - n/a  Sleep Study -  n/a CPAP - none  Anesthesia review: Yes  STOP now taking any Aspirin (unless otherwise instructed by your surgeon), Aleve, Naproxen, Ibuprofen, Motrin, Advil, Goody's, BC's, all herbal medications, fish oil, and all vitamins.   Anesthesia review: Yes  STOP now taking any Aspirin (unless otherwise instructed by your surgeon), Aleve, Naproxen, Ibuprofen, Motrin, Advil, Goody's, BC's, all herbal medications, fish oil, and all vitamins.   Coronavirus Screening Covid test n/a Ambulatory Surgery   Do you have any of the following symptoms:  Cough yes/no: No Fever (>100.38F)  yes/no: No Runny nose yes/no: No Sore throat yes/no: No Difficulty breathing/shortness of breath  yes/no: No  Have you traveled in the last 14 days and where? yes/no: No  Patient verbalized understanding of instructions that were given via phone.

## 2021-03-12 ENCOUNTER — Ambulatory Visit (HOSPITAL_COMMUNITY): Payer: BC Managed Care – PPO | Admitting: Physician Assistant

## 2021-03-12 ENCOUNTER — Other Ambulatory Visit: Payer: Self-pay

## 2021-03-12 ENCOUNTER — Ambulatory Visit (HOSPITAL_COMMUNITY)
Admission: RE | Admit: 2021-03-12 | Discharge: 2021-03-12 | Disposition: A | Discharge status: Home / Self Care | Payer: BC Managed Care – PPO | Attending: Plastic Surgery | Admitting: Plastic Surgery

## 2021-03-12 ENCOUNTER — Encounter (HOSPITAL_COMMUNITY)
Admission: RE | Disposition: A | Discharge status: Home / Self Care | Payer: Self-pay | Source: Home / Self Care | Attending: Plastic Surgery

## 2021-03-12 ENCOUNTER — Encounter (HOSPITAL_COMMUNITY): Payer: Self-pay | Admitting: Plastic Surgery

## 2021-03-12 DIAGNOSIS — R7303 Prediabetes: Secondary | ICD-10-CM | POA: Insufficient documentation

## 2021-03-12 DIAGNOSIS — C50211 Malignant neoplasm of upper-inner quadrant of right female breast: Secondary | ICD-10-CM | POA: Insufficient documentation

## 2021-03-12 DIAGNOSIS — L905 Scar conditions and fibrosis of skin: Secondary | ICD-10-CM | POA: Diagnosis not present

## 2021-03-12 DIAGNOSIS — Z45811 Encounter for adjustment or removal of right breast implant: Secondary | ICD-10-CM | POA: Insufficient documentation

## 2021-03-12 DIAGNOSIS — F419 Anxiety disorder, unspecified: Secondary | ICD-10-CM | POA: Insufficient documentation

## 2021-03-12 DIAGNOSIS — Z170 Estrogen receptor positive status [ER+]: Secondary | ICD-10-CM | POA: Insufficient documentation

## 2021-03-12 DIAGNOSIS — N651 Disproportion of reconstructed breast: Secondary | ICD-10-CM | POA: Diagnosis not present

## 2021-03-12 DIAGNOSIS — Z6841 Body Mass Index (BMI) 40.0 and over, adult: Secondary | ICD-10-CM | POA: Diagnosis not present

## 2021-03-12 DIAGNOSIS — D649 Anemia, unspecified: Secondary | ICD-10-CM | POA: Insufficient documentation

## 2021-03-12 DIAGNOSIS — Z45812 Encounter for adjustment or removal of left breast implant: Secondary | ICD-10-CM | POA: Diagnosis not present

## 2021-03-12 DIAGNOSIS — D500 Iron deficiency anemia secondary to blood loss (chronic): Secondary | ICD-10-CM | POA: Diagnosis not present

## 2021-03-12 DIAGNOSIS — N650 Deformity of reconstructed breast: Secondary | ICD-10-CM | POA: Diagnosis not present

## 2021-03-12 DIAGNOSIS — F32A Depression, unspecified: Secondary | ICD-10-CM | POA: Diagnosis not present

## 2021-03-12 HISTORY — PX: REMOVAL OF BILATERAL TISSUE EXPANDERS WITH PLACEMENT OF BILATERAL BREAST IMPLANTS: SHX6431

## 2021-03-12 HISTORY — PX: MASTOPEXY: SHX5358

## 2021-03-12 HISTORY — DX: Headache, unspecified: R51.9

## 2021-03-12 LAB — POCT PREGNANCY, URINE: Preg Test, Ur: NEGATIVE

## 2021-03-12 SURGERY — REMOVAL, TISSUE EXPANDER, BREAST, BILATERAL, WITH BILATERAL IMPLANT IMPLANT INSERTION
Anesthesia: General | Site: Breast | Laterality: Right

## 2021-03-12 MED ORDER — CEFAZOLIN SODIUM-DEXTROSE 2-4 GM/100ML-% IV SOLN
2.0000 g | INTRAVENOUS | Status: AC
  Administered 2021-03-12: 2 g via INTRAVENOUS

## 2021-03-12 MED ORDER — ONDANSETRON HCL 4 MG/2ML IJ SOLN: 4.0000 mg | Freq: Four times a day (QID) | INTRAMUSCULAR | Status: DC | PRN

## 2021-03-12 MED ORDER — 0.9 % SODIUM CHLORIDE (POUR BTL) OPTIME
TOPICAL | Status: DC | PRN
  Administered 2021-03-12: 1000 mL

## 2021-03-12 MED ORDER — LACTATED RINGERS IR SOLN: Status: DC | PRN

## 2021-03-12 MED ORDER — BUPIVACAINE HCL (PF) 0.25 % IJ SOLN
INTRAMUSCULAR | Status: AC
  Filled 2021-03-12: qty 30

## 2021-03-12 MED ORDER — EPINEPHRINE PF 1 MG/ML IJ SOLN
INTRAMUSCULAR | Status: AC
  Filled 2021-03-12: qty 1

## 2021-03-12 MED ORDER — SODIUM CHLORIDE 0.9 % IV SOLN
INTRAVENOUS | Status: AC
  Administered 2021-03-12: 500 mL
  Filled 2021-03-12: qty 1000

## 2021-03-12 MED ORDER — OXYCODONE HCL 5 MG PO TABS: 5.0000 mg | ORAL_TABLET | Freq: Once | ORAL | Status: DC | PRN

## 2021-03-12 MED ORDER — LIDOCAINE 2% (20 MG/ML) 5 ML SYRINGE
INTRAMUSCULAR | Status: DC | PRN
  Administered 2021-03-12: 60 mg via INTRAVENOUS

## 2021-03-12 MED ORDER — LACTATED RINGERS IV SOLN
INTRAVENOUS | Status: DC | PRN
  Administered 2021-03-12: 1000 mL

## 2021-03-12 MED ORDER — OXYCODONE HCL 5 MG/5ML PO SOLN: 5.0000 mg | Freq: Once | ORAL | Status: DC | PRN

## 2021-03-12 MED ORDER — DEXAMETHASONE SODIUM PHOSPHATE 10 MG/ML IJ SOLN
INTRAMUSCULAR | Status: DC | PRN
Start: 2021-03-12 — End: 2021-03-12
  Administered 2021-03-12: 5 mg via INTRAVENOUS

## 2021-03-12 MED ORDER — ONDANSETRON HCL 4 MG/2ML IJ SOLN
INTRAMUSCULAR | Status: AC
  Filled 2021-03-12: qty 2

## 2021-03-12 MED ORDER — FENTANYL CITRATE (PF) 100 MCG/2ML IJ SOLN
25.0000 ug | INTRAMUSCULAR | Status: DC | PRN
  Administered 2021-03-12: 50 ug via INTRAVENOUS
  Administered 2021-03-12: 25 ug via INTRAVENOUS
  Administered 2021-03-12: 50 ug via INTRAVENOUS
  Administered 2021-03-12: 25 ug via INTRAVENOUS

## 2021-03-12 MED ORDER — MIDAZOLAM HCL 2 MG/2ML IJ SOLN
INTRAMUSCULAR | Status: AC
  Filled 2021-03-12: qty 2

## 2021-03-12 MED ORDER — CHLORHEXIDINE GLUCONATE CLOTH 2 % EX PADS: 6.0000 | MEDICATED_PAD | Freq: Once | CUTANEOUS | Status: DC

## 2021-03-12 MED ORDER — LIDOCAINE 2% (20 MG/ML) 5 ML SYRINGE
INTRAMUSCULAR | Status: AC
  Filled 2021-03-12: qty 5

## 2021-03-12 MED ORDER — LACTATED RINGERS IV SOLN: INTRAVENOUS | Status: DC

## 2021-03-12 MED ORDER — FENTANYL CITRATE (PF) 250 MCG/5ML IJ SOLN
INTRAMUSCULAR | Status: AC
  Filled 2021-03-12: qty 5

## 2021-03-12 MED ORDER — FENTANYL CITRATE (PF) 250 MCG/5ML IJ SOLN
INTRAMUSCULAR | Status: DC | PRN
  Administered 2021-03-12: 100 ug via INTRAVENOUS
  Administered 2021-03-12 (×2): 50 ug via INTRAVENOUS

## 2021-03-12 MED ORDER — PROPOFOL 10 MG/ML IV BOLUS
INTRAVENOUS | Status: AC
  Filled 2021-03-12: qty 20

## 2021-03-12 MED ORDER — FENTANYL CITRATE (PF) 100 MCG/2ML IJ SOLN
INTRAMUSCULAR | Status: AC
  Filled 2021-03-12: qty 2

## 2021-03-12 MED ORDER — DEXAMETHASONE SODIUM PHOSPHATE 10 MG/ML IJ SOLN
INTRAMUSCULAR | Status: AC
  Filled 2021-03-12: qty 1

## 2021-03-12 MED ORDER — ROCURONIUM BROMIDE 10 MG/ML (PF) SYRINGE
PREFILLED_SYRINGE | INTRAVENOUS | Status: DC | PRN
Start: 2021-03-12 — End: 2021-03-12
  Administered 2021-03-12: 60 mg via INTRAVENOUS
  Administered 2021-03-12 (×2): 10 mg via INTRAVENOUS

## 2021-03-12 MED ORDER — CHLORHEXIDINE GLUCONATE 0.12 % MT SOLN: 15.0000 mL | Freq: Once | OROMUCOSAL | Status: AC

## 2021-03-12 MED ORDER — PHENYLEPHRINE HCL-NACL 20-0.9 MG/250ML-% IV SOLN
INTRAVENOUS | Status: DC | PRN
  Administered 2021-03-12: 10 ug/min via INTRAVENOUS

## 2021-03-12 MED ORDER — PROPOFOL 10 MG/ML IV BOLUS
INTRAVENOUS | Status: DC | PRN
  Administered 2021-03-12: 200 mg via INTRAVENOUS

## 2021-03-12 MED ORDER — SUGAMMADEX SODIUM 200 MG/2ML IV SOLN
INTRAVENOUS | Status: DC | PRN
  Administered 2021-03-12: 400 mg via INTRAVENOUS

## 2021-03-12 MED ORDER — ROCURONIUM BROMIDE 10 MG/ML (PF) SYRINGE
PREFILLED_SYRINGE | INTRAVENOUS | Status: AC
  Filled 2021-03-12: qty 10

## 2021-03-12 MED ORDER — MIDAZOLAM HCL 2 MG/2ML IJ SOLN
INTRAMUSCULAR | Status: DC | PRN
  Administered 2021-03-12: 2 mg via INTRAVENOUS

## 2021-03-12 MED ORDER — CEFAZOLIN SODIUM-DEXTROSE 2-4 GM/100ML-% IV SOLN
INTRAVENOUS | Status: AC
  Filled 2021-03-12: qty 100

## 2021-03-12 MED ORDER — ORAL CARE MOUTH RINSE: 15.0000 mL | Freq: Once | OROMUCOSAL | Status: AC

## 2021-03-12 MED ORDER — BUPIVACAINE HCL (PF) 0.25 % IJ SOLN
INTRAMUSCULAR | Status: DC | PRN
  Administered 2021-03-12: 30 mL

## 2021-03-12 MED ORDER — ONDANSETRON HCL 4 MG/2ML IJ SOLN
INTRAMUSCULAR | Status: DC | PRN
  Administered 2021-03-12: 4 mg via INTRAVENOUS

## 2021-03-12 MED ORDER — CHLORHEXIDINE GLUCONATE 0.12 % MT SOLN
OROMUCOSAL | Status: AC
  Administered 2021-03-12: 15 mL via OROMUCOSAL
  Filled 2021-03-12: qty 15

## 2021-03-12 SURGICAL SUPPLY — 68 items
BAG COUNTER SPONGE SURGICOUNT (BAG) ×1 IMPLANT
BAG DECANTER FOR FLEXI CONT (MISCELLANEOUS) ×3 IMPLANT
BENZOIN TINCTURE PRP APPL 2/3 (GAUZE/BANDAGES/DRESSINGS) ×6 IMPLANT
BIOPATCH RED 1 DISK 7.0 (GAUZE/BANDAGES/DRESSINGS) IMPLANT
BLADE SURG 15 STRL LF DISP TIS (BLADE) IMPLANT
BLADE SURG 15 STRL SS (BLADE)
BNDG ELASTIC 6X10 VLCR STRL LF (GAUZE/BANDAGES/DRESSINGS) ×1 IMPLANT
BNDG ELASTIC 6X5.8 VLCR STR LF (GAUZE/BANDAGES/DRESSINGS) ×3 IMPLANT
BNDG GAUZE ELAST 4 BULKY (GAUZE/BANDAGES/DRESSINGS) IMPLANT
CHLORAPREP W/TINT 26 (MISCELLANEOUS) ×3 IMPLANT
DECANTER SPIKE VIAL GLASS SM (MISCELLANEOUS) ×2 IMPLANT
DRAIN CHANNEL 15F RND FF W/TCR (WOUND CARE) ×2 IMPLANT
DRAPE HALF SHEET 70X43 (DRAPES) ×6 IMPLANT
DRAPE ORTHO SPLIT 77X108 STRL (DRAPES) ×6
DRAPE SURG ORHT 6 SPLT 77X108 (DRAPES) ×4 IMPLANT
DRSG PAD ABDOMINAL 8X10 ST (GAUZE/BANDAGES/DRESSINGS) ×5 IMPLANT
DRSG TEGADERM 4X10 (GAUZE/BANDAGES/DRESSINGS) IMPLANT
DRSG TEGADERM 4X4.75 (GAUZE/BANDAGES/DRESSINGS) IMPLANT
ELECT BLADE 4.0 EZ CLEAN MEGAD (MISCELLANEOUS) ×3
ELECT COATED BLADE 2.86 ST (ELECTRODE) IMPLANT
ELECT REM PT RETURN 9FT ADLT (ELECTROSURGICAL) ×3
ELECTRODE BLDE 4.0 EZ CLN MEGD (MISCELLANEOUS) IMPLANT
ELECTRODE REM PT RTRN 9FT ADLT (ELECTROSURGICAL) ×2 IMPLANT
EVACUATOR SILICONE 100CC (DRAIN) ×3 IMPLANT
FUNNEL KELLER 2 DISP (MISCELLANEOUS) ×1 IMPLANT
GAUZE SPONGE 4X4 12PLY STRL (GAUZE/BANDAGES/DRESSINGS) ×1 IMPLANT
GLOVE SRG 8 PF TXTR STRL LF DI (GLOVE) IMPLANT
GLOVE SURG ENC TEXT LTX SZ7 (GLOVE) ×3 IMPLANT
GLOVE SURG ENC TEXT LTX SZ7.5 (GLOVE) ×6 IMPLANT
GLOVE SURG UNDER POLY LF SZ8 (GLOVE)
GOWN STRL REUS W/ TWL LRG LVL3 (GOWN DISPOSABLE) ×4 IMPLANT
GOWN STRL REUS W/TWL LRG LVL3 (GOWN DISPOSABLE) ×6
IMPL BREAST P5.8XHI RND 490 (Breast) IMPLANT
IMPL BRST P5.8XHI RND 490CC (Breast) ×2 IMPLANT
IMPLANT BREAST GEL 490CC (Breast) ×3 IMPLANT
KIT BASIN OR (CUSTOM PROCEDURE TRAY) ×3 IMPLANT
KIT FILL SYSTEM UNIVERSAL (SET/KITS/TRAYS/PACK) IMPLANT
MARKER SKIN DUAL TIP RULER LAB (MISCELLANEOUS) IMPLANT
NDL HYPO 25X1 1.5 SAFETY (NEEDLE) IMPLANT
NDL SPNL 18GX3.5 QUINCKE PK (NEEDLE) IMPLANT
NEEDLE HYPO 25X1 1.5 SAFETY (NEEDLE) IMPLANT
NEEDLE SPNL 18GX3.5 QUINCKE PK (NEEDLE) ×6 IMPLANT
PACK GENERAL/GYN (CUSTOM PROCEDURE TRAY) ×3 IMPLANT
PACK SPY-PHI (KITS) ×3 IMPLANT
PENCIL SMOKE EVACUATOR (MISCELLANEOUS) ×3 IMPLANT
PIN SAFETY STERILE (MISCELLANEOUS) ×3 IMPLANT
RETRACTOR ONETRAX LX 135X30 (MISCELLANEOUS) IMPLANT
SIZER BREAST REUSE 490CC (SIZER) ×3
SIZER BRST REUSE P5.8XHI 490CC (SIZER) IMPLANT
SLEEVE SCD COMPRESS KNEE MED (STOCKING) ×3 IMPLANT
SPONGE T-LAP 18X18 ~~LOC~~+RFID (SPONGE) ×6 IMPLANT
STAPLER INSORB 30 2030 C-SECTI (MISCELLANEOUS) ×1 IMPLANT
STAPLER VISISTAT 35W (STAPLE) ×1 IMPLANT
STRIP CLOSURE SKIN 1/2X4 (GAUZE/BANDAGES/DRESSINGS) ×3 IMPLANT
SUT ETHILON 2 0 FS 18 (SUTURE) IMPLANT
SUT MON AB 3-0 SH 27 (SUTURE) ×3
SUT MON AB 3-0 SH27 (SUTURE) ×2 IMPLANT
SUT MON AB 4-0 PC3 18 (SUTURE) ×3 IMPLANT
SUT PDS AB 3-0 SH 27 (SUTURE) ×14 IMPLANT
SUT PLAIN 5 0 P 3 18 (SUTURE) IMPLANT
SUT VLOC 90 P-14 23 (SUTURE) ×6 IMPLANT
SYR 50ML LL SCALE MARK (SYRINGE) ×6 IMPLANT
SYR BULB IRRIG 60ML STRL (SYRINGE) ×3 IMPLANT
SYR CONTROL 10ML LL (SYRINGE) IMPLANT
TAPE MEASURE VINYL STERILE (MISCELLANEOUS) IMPLANT
TOWEL GREEN STERILE FF (TOWEL DISPOSABLE) ×6 IMPLANT
TRAY FOLEY W/BAG SLVR 14FR LF (SET/KITS/TRAYS/PACK) IMPLANT
TUBE CONNECTING 20X1/4 (TUBING) ×3 IMPLANT

## 2021-03-12 NOTE — Anesthesia Preprocedure Evaluation (Signed)
Anesthesia Evaluation  Patient identified by MRN, date of birth, ID band Patient awake    Reviewed: Allergy & Precautions, H&P , NPO status , Patient's Chart, lab work & pertinent test results  Airway Mallampati: II   Neck ROM: full    Dental   Pulmonary neg pulmonary ROS,    breath sounds clear to auscultation       Cardiovascular negative cardio ROS   Rhythm:regular Rate:Normal     Neuro/Psych  Headaches, PSYCHIATRIC DISORDERS Anxiety Depression    GI/Hepatic   Endo/Other  Morbid obesity  Renal/GU      Musculoskeletal   Abdominal   Peds  Hematology   Anesthesia Other Findings   Reproductive/Obstetrics Breast CA                             Anesthesia Physical Anesthesia Plan  ASA: 3  Anesthesia Plan: General   Post-op Pain Management:    Induction: Intravenous  PONV Risk Score and Plan: 3 and Ondansetron, Dexamethasone, Midazolam and Treatment may vary due to age or medical condition  Airway Management Planned: Oral ETT  Additional Equipment:   Intra-op Plan:   Post-operative Plan: Extubation in OR  Informed Consent: I have reviewed the patients History and Physical, chart, labs and discussed the procedure including the risks, benefits and alternatives for the proposed anesthesia with the patient or authorized representative who has indicated his/her understanding and acceptance.     Dental advisory given  Plan Discussed with: CRNA, Anesthesiologist and Surgeon  Anesthesia Plan Comments:         Anesthesia Quick Evaluation

## 2021-03-12 NOTE — Transfer of Care (Signed)
Immediate Anesthesia Transfer of Care Note  Patient: Connie Underwood  Procedure(s) Performed: REMOVAL OF RIGHT TISSUE EXPANDERS WITH PLACEMENT OF RIGHT  BREAST IMPLANTS (Right: Breast) LEFT MASTOPEXY (Left: Breast)  Patient Location: PACU  Anesthesia Type:General  Level of Consciousness: drowsy  Airway & Oxygen Therapy: Patient Spontanous Breathing and Patient connected to face mask oxygen  Post-op Assessment: Report given to RN and Post -op Vital signs reviewed and stable  Post vital signs: Reviewed and stable  Last Vitals:  Vitals Value Taken Time  BP 129/64 03/12/21 0944  Temp    Pulse 98 03/12/21 0947  Resp 26 03/12/21 0947  SpO2 93 % 03/12/21 0947  Vitals shown include unvalidated device data.  Last Pain:  Vitals:   03/12/21 0557  TempSrc: Oral  PainSc: 0-No pain         Complications: No notable events documented.

## 2021-03-12 NOTE — Op Note (Signed)
Operative Note   DATE OF OPERATION: 03/12/2021  SURGICAL DEPARTMENT: Plastic Surgery  PREOPERATIVE DIAGNOSES: Mastectomy defect after breast cancer treatment  POSTOPERATIVE DIAGNOSES:  same  PROCEDURE: 1.  Exchange of right breast tissue expander for gel implant 2.  Left breast mastopexy 3.  Excision of excess right breast skin totaling 15 cm in length  SURGEON: Talmadge Coventry, MD  ASSISTANT: Verdie Shire, PA The advanced practice practitioner (APP) assisted throughout the case.  The APP was essential in retraction and counter traction when needed to make the case progress smoothly.  This retraction and assistance made it possible to see the tissue plans for the procedure.  The assistance was needed for blood control, tissue re-approximation and assisted with closure of the incision site.  ANESTHESIA:  General.   COMPLICATIONS: None.   INDICATIONS FOR PROCEDURE:  The patient, Connie Underwood is a 45 y.o. female born on Apr 17, 1975, is here for treatment of breast reconstruction MRN: [id number]  CONSENT:  Informed consent was obtained directly from the patient. Risks, benefits and alternatives were fully discussed. Specific risks including but not limited to bleeding, infection, hematoma, seroma, scarring, pain, contracture, asymmetry, wound healing problems, and need for further surgery were all discussed. The patient did have an ample opportunity to have questions answered to satisfaction.   DESCRIPTION OF PROCEDURE:  The patient was taken to the operating room. SCDs were placed and antibiotics were given.  General anesthesia was administered.  The patient's operative site was prepped and draped in a sterile fashion. A time out was performed and all information was confirmed to be correct.  Started by marking out the excess tissue excision on the right side.  This was a small dogear medially that was 3 cm in size in addition to some excess axillary tissue that extended about 12  x 3 cm.  This tissue was all excised with knife and cautery and sent as specimen.  Medial part was closed with interrupted buried 3-0 PDS sutures and a running 4 Monocryl.  Lateral aspect was undermined circumferentially and advanced and closed in layers with Enzor stapler.  I then dissected down to the implant capsule with cautery and the expander was removed.  A 490 cc sizer was placed and looks like to fill out the pocket nicely.  The skin was temporarily stapled closed.  I then turned my attention to the left side.  I had marked her in the standing position for a mastopexy preoperatively.  I stapled the skin temporarily to estimate the contour which gave a nice mass to the other side.  Staples were then removed after marking the skin for appropriate amount of excision.  The nipple was then marked with a 42 mm cookie cutter.  Small amount of tumescent solution was infiltrated.  I then de-epithelialized the superior aspect with a blade and then excised the dermis inferiorly along with some subcutaneous tissue laterally where there was excess.  Hemostasis was obtained with cautery.  A limited amount of undermining was done in the dermis and the vertical aspect to help with the nipple areolar complex transposed superiorly.  I then stapled the skin closed which gave a nice shape and mass to the other side.  Closure was then done along the vertical limb with 3-0 PDS sutures, the inframammary crease with buried Enzor staples, and the periareolar limb with 4-0 Monocryl sutures.  3 oh V-Loc was then run throughout.  I then changed my gloves and remove the sizer on the right side.  Pocket was irrigated with triple antibiotic solution.  The implant was brought onto the field and this was a Mentor memory gel extra high-profile implant with 490 cc of volume.  Serial [id number].  This was placed into the pocket with a Keller funnel.  Pocket was closed with interrupted PDS sutures.  The remaining aspect of the incision on  the right side was then closed with combination of buried Enzor staples and a running 3 oh V-Loc subcuticular.  Benzoin and Steri-Strips were applied followed by soft compressive dressing.  The patient tolerated the procedure well.  There were no complications. The patient was allowed to wake from anesthesia, extubated and taken to the recovery room in satisfactory condition.

## 2021-03-12 NOTE — Interval H&P Note (Signed)
Patient seen and examined. Risks and benefits discussed. Proceed with surgery.

## 2021-03-12 NOTE — Anesthesia Procedure Notes (Addendum)
Procedure Name: Intubation Date/Time: 03/12/2021 7:58 AM Performed by: Lorie Phenix, CRNA Pre-anesthesia Checklist: Patient identified, Emergency Drugs available, Suction available and Patient being monitored Patient Re-evaluated:Patient Re-evaluated prior to induction Oxygen Delivery Method: Circle system utilized Preoxygenation: Pre-oxygenation with 100% oxygen Induction Type: IV induction Ventilation: Mask ventilation without difficulty and Oral airway inserted - appropriate to patient size Laryngoscope Size: Mac and 3 Grade View: Grade I Tube type: Oral Number of attempts: 1 Airway Equipment and Method: Stylet and Oral airway Placement Confirmation: ETT inserted through vocal cords under direct vision, positive ETCO2 and breath sounds checked- equal and bilateral Secured at: 22 cm Tube secured with: Tape Dental Injury: Teeth and Oropharynx as per pre-operative assessment

## 2021-03-12 NOTE — Discharge Instructions (Signed)
Activity As tolerated: NO showers for 3 days. Keep ACE wrap on breasts until then. After showering, put ACE wrap back on, this is important for compression. NO driving while in pain, taking pain medication or if you are unable to safely react to traffic. No heavy activities Take Pain medication (Norco) as needed for severe pain. Otherwise, you can use ibuprofen or tylenol as needed. Avoid more than 3,000 mg of tylenol in 24 hours. Norco has 325mg  of tylenol per dose.  Diet: Regular. Drink plenty of fluids and eat healthy, high protein, low carbs.  Wound Care: Keep dressing clean & dry. You may change bandages after showering if you continue to notice some drainage. You can reuse bandages if they are not dirty/soiled.  Special Instructions: Call Doctor if any unusual problems occur such as pain, excessive Bleeding, unrelieved Nausea/vomiting, Fever &/or chills  Follow-up appointment: Scheduled for next week.

## 2021-03-13 ENCOUNTER — Encounter (HOSPITAL_COMMUNITY): Payer: Self-pay | Admitting: Plastic Surgery

## 2021-03-13 LAB — SURGICAL PATHOLOGY

## 2021-03-14 NOTE — Anesthesia Postprocedure Evaluation (Signed)
Anesthesia Post Note  Patient: Connie Underwood  Procedure(s) Performed: REMOVAL OF RIGHT TISSUE EXPANDERS WITH PLACEMENT OF RIGHT  BREAST IMPLANTS (Right: Breast) LEFT MASTOPEXY (Left: Breast)     Patient location during evaluation: PACU Anesthesia Type: General Level of consciousness: awake and alert Pain management: pain level controlled Vital Signs Assessment: post-procedure vital signs reviewed and stable Respiratory status: spontaneous breathing, nonlabored ventilation, respiratory function stable and patient connected to nasal cannula oxygen Cardiovascular status: blood pressure returned to baseline and stable Postop Assessment: no apparent nausea or vomiting Anesthetic complications: no   No notable events documented.  Last Vitals:  Vitals:   03/12/21 1014 03/12/21 1029  BP: 117/63 109/66  Pulse: 86 81  Resp: 12 17  Temp:  36.4 C  SpO2: 94% 93%    Last Pain:  Vitals:   03/12/21 1029  TempSrc:   PainSc: [place]

## 2021-03-19 ENCOUNTER — Encounter: Payer: Self-pay | Admitting: Genetic Counselor

## 2021-03-22 ENCOUNTER — Ambulatory Visit (INDEPENDENT_AMBULATORY_CARE_PROVIDER_SITE_OTHER): Payer: BC Managed Care – PPO | Admitting: Plastic Surgery

## 2021-03-22 ENCOUNTER — Other Ambulatory Visit: Payer: Self-pay

## 2021-03-22 DIAGNOSIS — C50211 Malignant neoplasm of upper-inner quadrant of right female breast: Secondary | ICD-10-CM

## 2021-03-22 DIAGNOSIS — Z170 Estrogen receptor positive status [ER+]: Secondary | ICD-10-CM

## 2021-03-22 NOTE — Progress Notes (Signed)
Patient presents postop from exchange of right tissue expander for implant and a left mastopexy.  She is overall feeling good and is very happy.  On exam her incisions are healing great.  She has a very nice symmetric result.  Left nipple areolar complex is viable.  No obvious subcutaneous fluid.  Plan to continue compressive garments and avoid strenuous activity and see her again in a few weeks.

## 2021-03-24 DIAGNOSIS — J019 Acute sinusitis, unspecified: Secondary | ICD-10-CM | POA: Diagnosis not present

## 2021-03-27 ENCOUNTER — Encounter: Payer: Self-pay | Admitting: Plastic Surgery

## 2021-03-30 ENCOUNTER — Telehealth: Payer: Self-pay

## 2021-03-30 NOTE — Telephone Encounter (Signed)
Pt called wanting to know when she can restart taking her Tamoxifen now since she's finished with surgery.  Sent staff message to Dr. Burr Medico for further advisement.

## 2021-04-03 NOTE — Progress Notes (Unsigned)
Patient is a 46 year old female with PMH of right-sided breast cancer s/p reconstruction with implant exchange and left-sided mastopexy performed 03/12/2021 by Dr. Claudia Desanctis who presents to clinic for postoperative follow-up.  She was seen most recently here in the office on 03/22/2021.  At that time, she was pleased with her surgical outcome.  Incisions appear to be healing quite well and there were no concerning findings on exam.  Plan was for continued compression garments and activity modifications.  Today,  Scar mitigation!

## 2021-04-04 ENCOUNTER — Other Ambulatory Visit: Payer: Self-pay

## 2021-04-04 NOTE — Progress Notes (Signed)
Spoke with pt via telephone to let her know she can restart her Tamoxifen.  Pt has been cleared by the surgeon to return to work.  Pt will restart her tamoxifen on 04/05/2021.  Pt had no further questions or concerns at this time.

## 2021-04-05 ENCOUNTER — Ambulatory Visit: Payer: BC Managed Care – PPO | Admitting: Physician Assistant

## 2021-04-05 ENCOUNTER — Ambulatory Visit (INDEPENDENT_AMBULATORY_CARE_PROVIDER_SITE_OTHER): Payer: BC Managed Care – PPO | Admitting: Physician Assistant

## 2021-04-05 ENCOUNTER — Other Ambulatory Visit: Payer: Self-pay

## 2021-04-05 DIAGNOSIS — Z9889 Other specified postprocedural states: Secondary | ICD-10-CM

## 2021-04-05 NOTE — Progress Notes (Signed)
Patient is a 46 year old female with PMH of right-sided breast cancer s/p reconstruction with implant exchange and left-sided mastopexy performed 03/12/2021 by Dr. Claudia Desanctis who presents to clinic for postoperative follow-up.   She was seen most recently here in the office on 03/22/2021.  At that time, she was pleased with her surgical outcome.  Incisions appear to be healing quite well and there were no concerning findings on exam.  Plan was for continued compression garments and activity modifications.   Today, patient is doing extremely well.  She is very pleased with the shape and symmetry of her breasts.  Her only mild concern is flattening of upper right breast.  Otherwise, no complaints.  Specifically, she denies any redness, drainage, wounds, fevers or chills, or other symptoms.   The physical exam is entirely reassuring.  She has excellent symmetry.  There is flattening of superior pole right breast medially, but she reports that this is improved compared to how it was during her expander placement.  We discussed possible fat grafting down the line.  No areas of wound dehiscence.  No asymmetric swelling or redness noted.  While patient is only 3 to 4 weeks postop, she has healed tremendously well and it is cleared from a postoperative standpoint.  She can certainly call the clinic should he develop any questions or concerns in the next few weeks.  Otherwise, continue with compressive bra for another couple weeks before transitioning to normal bra.  No underwire.  No submersion of breasts in water for at least 2 to 3 months.  Discussed possible tattooing down the line, as well.  Recommending Mederma or other scar mitigation cream.  She denies any personal history of keloiding or hypertrophic scarring.  Picture(s) obtained of the patient and placed in the chart were with the patient's or guardian's permission.

## 2021-04-16 ENCOUNTER — Telehealth: Payer: Self-pay | Admitting: *Deleted

## 2021-04-18 ENCOUNTER — Other Ambulatory Visit: Payer: Self-pay

## 2021-04-18 DIAGNOSIS — D500 Iron deficiency anemia secondary to blood loss (chronic): Secondary | ICD-10-CM

## 2021-04-18 NOTE — Progress Notes (Signed)
CLINIC:  Survivorship   REASON FOR VISIT:  Routine follow-up post-treatment for a recent history of breast cancer.  BRIEF ONCOLOGIC HISTORY:  Oncology History Overview Note  Cancer Staging Malignant neoplasm of upper-inner quadrant of right breast in female, estrogen receptor positive ([place]) Staging form: Breast, AJCC 8th Edition - Clinical stage from 10/06/2020: Stage IB (cT2, cN0, cM0, G2, ER+, PR+, HER2-) - Signed by Truitt Merle, MD on 10/17/2020 Stage prefix: Initial diagnosis Histologic grading system: 3 grade system - Pathologic stage from 12/25/2020: No Stage Recommended (ypT2, pN33mi, cM0, G2, ER+, PR+, HER2-) - Signed by Truitt Merle, MD on 01/22/2021 Stage prefix: Post-therapy Histologic grading system: 3 grade system Residual tumor (R): R0 - None    Malignant neoplasm of upper-inner quadrant of right breast in female, estrogen receptor positive ([place])  10/06/2020 Mammogram   DIGITAL DIAGNOSTIC BILATERAL MAMMOGRAM WITH TOMOSYNTHESIS AND CAD; ULTRASOUND RIGHT BREAST LIMITED  IMPRESSION: Two contiguous hypoechoic masses in the right breast- an irregular hypoechoic mass in the right breast at 12:30 3 cm from the nipple measuring 2.5 x 2.1 x 3.0 cm, and a hypoechoic heterogeneous mass at 12:30 2 cm from the nipple measuring 3.0 x 1.6 x 2.8 cm. Sonographic evaluation of the right axilla does not show any enlarged adenopathy.   10/06/2020 Pathology Results   Diagnosis Breast, right, needle core biopsy - INVASIVE MAMMARY CARCINOMA WITH LOBULAR FEATURES - SEE COMMENT Microscopic Comment The biopsy material shows an infiltrative proliferation of cells with large vesicular nuclei with inconspicuous nucleoli, arranged linearly and in small clusters. Based on the biopsy, the carcinoma appears Nottingham grade 2 of 3 and measures 0.9 cm in greatest linear extent  E-cadherin is POSITIVE supporting a ductal origin.  PROGNOSTIC INDICATORS Results: IMMUNOHISTOCHEMICAL AND MORPHOMETRIC ANALYSIS  PERFORMED MANUALLY The tumor cells are NEGATIVE for Her2 (1+). Estrogen Receptor: 80%, POSITIVE, STRONG STAINING INTENSITY Progesterone Receptor: 90%, POSITIVE, STRONG STAINING INTENSITY Proliferation Marker Ki67: 10%   10/06/2020 Cancer Staging   Staging form: Breast, AJCC 8th Edition - Clinical stage from 10/06/2020: Stage IB (cT2, cN0, cM0, G2, ER+, PR+, HER2-) - Signed by Truitt Merle, MD on 10/17/2020 Stage prefix: Initial diagnosis Histologic grading system: 3 grade system    10/06/2020 Oncotype testing   RS of 19, distant recurrence risk of 6%, chemotherapy not recommended   10/13/2020 Initial Diagnosis   Malignant neoplasm of upper-inner quadrant of right breast in female, estrogen receptor positive ([place])   10/24/2020 Genetic Testing   Negative hereditary cancer genetic testing: no pathogenic variants detected in [facility] BRCAPlus Panel and [facility] CancerNext-Expanded +RNAinsight.  Variants of uncertain significance detected in [place] at p.Y2H (c.4T>C) and SUFU at  p.A138T (c.412G>A). The report dates are October 24, 2020 and October 27, 2020, respectively.    The BRCAplus panel offered by [employer] and includes sequencing and deletion/duplication analysis for the following 8 genes: ATM, BRCA1, BRCA2, CDH1, CHEK2, PALB2, PTEN, and TP53.  The CancerNext-Expanded gene panel offered by [facility] and includes sequencing, rearrangement, and RNA analysis for the following 77 genes: AIP, ALK, APC, ATM, AXIN2, BAP1, BARD1, BLM, BMPR1A, BRCA1, BRCA2, BRIP1, CDC73, CDH1, CDK4, CDKN1B, CDKN2A, CHEK2, CTNNA1, DICER1, FANCC, FH, FLCN, GALNT12, KIF1B, LZTR1, MAX, MEN1, MET, MLH1, MSH2, MSH3, MSH6, MUTYH, NBN, NF1, NF2, NTHL1, PALB2, PHOX2B, PMS2, POT1, PRKAR1A, PTCH1, PTEN, RAD51C, RAD51D, RB1, RECQL, RET, SDHA, SDHAF2, SDHB, SDHC, SDHD, SMAD4, SMARCA4, SMARCB1, SMARCE1, STK11, SUFU, TMEM127, TP53, TSC1, TSC2, VHL and XRCC2 (sequencing and deletion/duplication); EGFR, EGLN1, HOXB13, KIT, MITF, PDGFRA, POLD1, and POLE  (  sequencing only); EPCAM and GREM1 (deletion/duplication only).   The variant of uncertain significance (VUS) in FH at p.Y2H has been reclassified to likely benign.  The change in variant classification was made as a result of re-review of evidence in light of new variant interpretation guidelines and/or new information. The amended report date is March 15, 2021.    11/09/2020 Imaging   Breast MRI  IMPRESSION: 1. Known biopsy-proven invasive carcinoma within the upper inner quadrant of the RIGHT breast, measuring 3.6 cm, containing a biopsy clip. Contiguous linear non-mass enhancement extends superior-lateral to the spiculated mass, increasing overall craniocaudal dimension to 3 cm. 2. Additional irregular enhancing mass within the inner RIGHT breast, 3 o'clock axis region, at anterior depth, measuring 7 mm, with suspicious washout kinetics. MRI-guided biopsy is recommended to exclude multifocal disease. 3. Additional linear clumped non-mass enhancement within the upper inner quadrant of the LEFT breast, at posterior depth, measuring 1.8 cm extent, with suspicious washout kinetics. This may represent an intramammary lymph node. MRI-guided biopsy is recommended to exclude contralateral disease.   12/25/2020 Cancer Staging   Staging form: Breast, AJCC 8th Edition - Pathologic stage from 12/25/2020: No Stage Recommended (ypT2, pN33mi, cM0, G2, ER+, PR+, HER2-) - Signed by Truitt Merle, MD on 01/22/2021 Stage prefix: Post-therapy Histologic grading system: 3 grade system Residual tumor (R): R0 - None    12/25/2020 Definitive Surgery   FINAL MICROSCOPIC DIAGNOSIS:   A. BREAST, RIGHT, MASTECTOMY:  - Invasive ductal carcinoma with lobular features, grade 2, spanning 3.1 cm.  - Intermediate grade ductal carcinoma in situ.  - Margins are negative for carcinoma.  - Lymphovascular invasion.  - Biopsy site.  - Fibroadenomatoid change.  - Pseudoangiomatous stromal hyperplasia.  - Intraductal  papilloma.  - See oncology table.   B. LYMPH NODE, RIGHT AXILLARY, SENTINEL EXCISION:  - Isolated tumor cells in one of one lymph node (0/1).   C. LYMPH NODE, RIGHT AXILLARY, SENTINEL EXCISION:  - One of one lymph nodes negative for carcinoma. (0/1).   04/19/2021 Survivorship   SCP delivered by Cira Rue, NP     INTERVAL HISTORY:  Ms. Leano presents to the [facility] today for our initial meeting to review her survivorship care plan detailing her treatment course for breast cancer, as well as monitoring long-term side effects of that treatment, education regarding health maintenance, screening, and overall wellness and health promotion.     Overall, Ms. Krummel is doing well physically, she had a recent plastic surgery in December and has recovered well.  Right chest wall still slightly numb, she has limited range of motion in the right upper extremity.  She is interested in PT.  She held tamoxifen as instructed and has resumed, tolerating well with moderate hot flashes at night.  She is willing to tolerate this.  She notes tamoxifen has changed her cycles, she has heavy bleeding with clots for 3 of 5 days.  She is due to begin next menstrual cycle this weekend.  She has progressive fatigue.  Denies cough, chest pain, dyspnea, or any other new complaints.  She notes she is still trying to wrap her head around her cancer diagnosis.   REVIEW OF SYSTEMS:  Review of Systems - Oncology Breast: Denies any new nodularity, masses, tenderness, nipple changes, or nipple discharge.      ONCOLOGY TREATMENT TEAM:  1. Surgeon:  Dr. Donne Hazel at [facility], plastic surgeon Dr. Silverio Lay pace 2. Medical Oncologist: Dr. Burr Medico    PAST MEDICAL/SURGICAL HISTORY:  Past  Medical History:  Diagnosis Date   Anemia    Anxiety    Breast cancer ([facility])    right   Depression    Family history of prostate cancer 10/18/2020   Headache    migraines   Hx of migraines    Pre-diabetes     Seasonal allergic reaction    Past Surgical History:  Procedure Laterality Date   BREAST RECONSTRUCTION WITH PLACEMENT OF TISSUE EXPANDER AND FLEX HD (ACELLULAR HYDRATED DERMIS) Right 12/25/2020   Procedure: RIGHT BREAST RECONSTRUCTION WITH PLACEMENT OF TISSUE EXPANDER AND FLEX HD (ACELLULAR HYDRATED DERMIS);  Surgeon: Cindra Presume, MD;  Location: [facility];  Service: Plastics;  Laterality: Right;   MASTECTOMY W/ SENTINEL NODE BIOPSY Right 12/25/2020   Procedure: RIGHT MASTECTOMY WITH RIGHT AXILLARY SENTINEL LYMPH NODE BIOPSY;  Surgeon: Rolm Bookbinder, MD;  Location: [facility];  Service: General;  Laterality: Right;   MASTOPEXY Left 03/12/2021   Procedure: LEFT MASTOPEXY;  Surgeon: Cindra Presume, MD;  Location: [facility];  Service: Plastics;  Laterality: Left;   REMOVAL OF BILATERAL TISSUE EXPANDERS WITH PLACEMENT OF BILATERAL BREAST IMPLANTS Right 03/12/2021   Procedure: REMOVAL OF RIGHT TISSUE EXPANDERS WITH PLACEMENT OF RIGHT  BREAST IMPLANTS;  Surgeon: Cindra Presume, MD;  Location: [facility];  Service: Plastics;  Laterality: Right;   TUBAL LIGATION       ALLERGIES:  Allergies  Allergen Reactions   Tomato     Hives   Pollen Extract Other (See Comments)    Seasonal allergies      CURRENT MEDICATIONS:  Outpatient Encounter Medications as of 04/19/2021  Medication Sig Note   cetirizine (ZYRTEC ALLERGY) 10 MG tablet Take 1 tablet (10 mg total) by mouth daily. 03/07/2021: ON HOLD    EMGALITY 120 MG/ML SOAJ Inject 1 mL into the skin every 30 (thirty) days. 12/15/2020: Needs Refilled    escitalopram (LEXAPRO) 10 MG tablet Take 10 mg by mouth daily. 12/15/2020: ON HOLD    fluticasone (FLONASE) 50 MCG/ACT nasal spray Place 2 sprays into both nostrils daily. (Patient taking differently: Place 2 sprays into both nostrils daily as needed for allergies.) 03/07/2021: ON HOLD    naproxen (NAPROSYN) 500 MG tablet Take 1 tablet (500 mg total) by mouth 2 (two) times daily with a meal.    pseudoephedrine  (SUDAFED) 60 MG tablet Take 1 tablet (60 mg total) by mouth every 8 (eight) hours as needed for congestion. 03/07/2021: ON HOLD    SUMAtriptan (IMITREX) 50 MG tablet Take 50 mg by mouth every 2 (two) hours as needed for migraine. 03/07/2021: ON HOLD    tamoxifen (NOLVADEX) 20 MG tablet TAKE 1 TABLET BY MOUTH EVERY DAY 03/07/2021: ON HOLD    tiZANidine (ZANAFLEX) 4 MG tablet Take 1 tablet (4 mg total) by mouth at bedtime. (Patient taking differently: Take 4 mg by mouth at bedtime as needed for muscle spasms.) 03/07/2021: ON HOLD    Vitamin D, Ergocalciferol, (DRISDOL) 1.25 MG (50000 UNIT) CAPS capsule Take 50,000 Units by mouth once a week. 12/15/2020: ON HOLD    No facility-administered encounter medications on file as of 04/19/2021.     ONCOLOGIC FAMILY HISTORY:  Family History  Problem Relation Age of Onset   Prostate cancer Maternal Uncle        dx after 37   Cancer Maternal Uncle        unknown type dx after 50; prostate? lung?   Prostate cancer Paternal Uncle        3 paternal  uncles with prostate cancer dx after 49   Prostate cancer Paternal Grandfather        dx after 59     GENETIC COUNSELING/TESTING: Yes, negative except VUS in FH (likely benign) and SUFU  SOCIAL HISTORY:  LAJOYA DOMBEK has returned to work.  She denies any current or history of tobacco, alcohol, or illicit drug use.     PHYSICAL EXAMINATION:  Vital Signs:   Vitals:   04/19/21 1021  BP: 109/70  Pulse: 76  Resp: 20  Temp: (!) 97.3 F (36.3 C)  SpO2: 100%   Filed Weights   04/19/21 1021  Weight: 236 lb 9.6 oz (107.3 kg)   General: Well-nourished, well-appearing female in no acute distress.   HEENT: Sclerae anicteric.  Lymph: No cervical, supraclavicular, or infraclavicular lymphadenopathy noted on palpation.  Respiratory:  breathing non-labored.  GI: Abdomen soft and round; non-tender, non-distended. Bowel sounds normoactive.  Neuro: No focal deficits. Steady gait.  Psych: Mood and affect  normal and appropriate for situation.  Extremities: No edema. MSK: No focal spinal tenderness to palpation.  Skin: Warm and dry. Breast exam: S/p right mastectomy with reconstruction, incision completely healed with 1 retained stitch at the medial scar, no palpable mass or nodularity along the incision or chest wall.  S/p left reconstruction, no nipple discharge or inversion, incisions completely healed.  No palpable mass or nodularity in the left breast or either axilla that I could appreciate  LABORATORY DATA:  CBC Latest Ref Rng & Units 04/19/2021 02/15/2021 01/23/2021  WBC 4.0 - 10.5 K/uL 6.9 6.3 5.3  Hemoglobin 12.0 - 15.0 g/dL 9.8(L) 11.4(L) 9.7(L)  Hematocrit 36.0 - 46.0 % 31.2(L) 36.3 29.6(L)  Platelets 150 - 400 K/uL 391 362 365    CMP Latest Ref Rng & Units 04/19/2021 02/15/2021 01/23/2021  Glucose 70 - 99 mg/dL 108(H) 97 101(H)  BUN 6 - 20 mg/dL _0 Creatinine 0.44 - 1.00 mg/dL 0.89 0.83 0.81  Sodium 135 - 145 mmol/L 138 141 141  Potassium 3.5 - 5.1 mmol/L 3.5 3.6 3.6  Chloride 98 - 111 mmol/L 110 108 110  CO2 22 - 32 mmol/L 19(L) 24 22  Calcium 8.9 - 10.3 mg/dL 8.8(L) 8.6(L) 8.6(L)  Total Protein 6.5 - 8.1 g/dL 7.0 7.0 6.7  Total Bilirubin 0.3 - 1.2 mg/dL 0.5 0.3 0.5  Alkaline Phos 38 - 126 U/L 40 51 45  AST 15 - 41 U/L 16 14(L) 22  ALT 0 - 44 U/L _1 DIAGNOSTIC IMAGING:  None for this visit.      ASSESSMENT AND PLAN:  Ms.. Beaumont is a pleasant 46 y.o. female with Stage 1 right breast invasive ductal carcinoma with lobular features, ER+/PR+/HER2-, diagnosed in 09/2020, treated with neoadjuvant tamoxifen, right mastectomy, and anti-estrogen therapy with tamoxifen beginning in 09/2020.  She presents to the Survivorship Clinic for our initial meeting and routine follow-up post-completion of treatment for breast cancer.    1. Stage 1 right breast cancer:  Ms. Burda is continuing to recover from definitive treatment for breast cancer. She will follow-up with  her medical oncologist, Dr. Burr Medico in 06/2021 with history and physical exam per surveillance protocol.  She will continue her anti-estrogen therapy with amoxicillin. Thus far, she is tolerating well with moderate hot flashes and heavy menstrual bleeding. She was instructed to make Dr. Burr Medico or myself aware if she begins to experience any worsening side effects of the medication and I could see her back  in clinic to help manage those side effects, as needed. Breast exam is benign, no clinical concern for recurrence. Today, a comprehensive survivorship care plan and treatment summary was reviewed with the patient today detailing her breast cancer diagnosis, treatment course, potential late/long-term effects of treatment, appropriate follow-up care with recommendations for the future, and patient education resources.  A copy of this summary, along with a letter will be sent to the patients primary care provider via In Basket message after todays visit.    2. Iron deficiency anemia, secondary to menorrhagia -she receives IV venofer periodically. She has become more symptomatic of anemia, mainly fatigue. Ferritin is 6 today, will proceed with venofer today and 2 additional doses over the next few weeks. She may benefit from discussion with ob/gyn on ways to manage menorrhagia. I do not recommend ER/PR containing products, given her h/o ER/PR + breast cancer.   3. Bone health: Ms. Mutchler is premenopausal on tamoxifen, I reviewed the bone strengthening qualities of this medication.  I encouraged her to take calcium and vitamin D and engage in weight-bearing activities.  She was given education on specific activities to promote bone health.  4. Cancer screening:  Due to Ms. Schmieder's history and her age, she should receive screening for skin cancers, colon cancer, and gynecologic cancers.  The information and recommendations are listed on the patient's comprehensive care plan/treatment summary and were reviewed in  detail with the patient.    5. Health maintenance and wellness promotion: Ms. Bitting was encouraged to consume 5-7 servings of fruits and vegetables per day. We reviewed the "Nutrition Rainbow" handout. She was also encouraged to engage in moderate to vigorous exercise for 30 minutes per day most days of the week. We discussed the [employer] fitness program, which is designed for cancer survivors to help them become more physically fit after cancer treatments.  She was instructed to limit her alcohol consumption and continue to abstain from tobacco use.     6. Support services/counseling: It is not uncommon for this period of the patient's cancer care trajectory to be one of many emotions and stressors.  We discussed an opportunity for her to participate in the next session of [facility] ("Finding Your New Normal") support group series designed for patients after they have completed treatment.   Ms. Angevine was encouraged to take advantage of our many other support services programs, support groups, and/or counseling in coping with her new life as a cancer survivor after completing anti-cancer treatment.  She was offered support today through active listening and expressive supportive counseling.  She was given information regarding our available services and encouraged to contact me with any questions or for help enrolling in any of our support group/programs.    Dispo:   -Referrals to PT, OB/GYN (menorrhagia), and GI to discuss beginning screening  -return to cancer center in 3 months -Mammogram due in 09/2021 -Follow up with surgery as scheduled -Continue tamoxifen -Venofer today and 2 additional doses over the next few weeks; repeat lab in 6 weeks to see if she needs additional IV iron -She is welcome to return back to the Survivorship Clinic at any time; no additional follow-up needed at this time.  -Consider referral back to survivorship as a long-term survivor for continued  surveillance  Orders Placed This Encounter  Procedures   Ambulatory referral to Physical Therapy    Referral Priority:   Routine    Referral Type:   Physical Medicine    Referral Reason:  Specialty Services Required    Requested Specialty:   Physical Therapy    Number of Visits Requested:   1   Ambulatory referral to Gastroenterology    Referral Priority:   Routine    Referral Type:   Consultation    Referral Reason:   Specialty Services Required    Number of Visits Requested:   1   Ambulatory referral to Obstetrics / Gynecology    Referral Priority:   Routine    Referral Type:   Consultation    Referral Reason:   Specialty Services Required    Requested Specialty:   Obstetrics and Gynecology    Number of Visits Requested:   1     A total of (40) minutes of face-to-face time was spent with this patient with greater than 50% of that time in counseling and care-coordination.   Cira Rue, NP Survivorship Program [facility] [phone]   Note: PRIMARY CARE PROVIDER Kristen Loader, [place] [phone]

## 2021-04-19 ENCOUNTER — Inpatient Hospital Stay: Payer: BC Managed Care – PPO

## 2021-04-19 ENCOUNTER — Inpatient Hospital Stay (HOSPITAL_BASED_OUTPATIENT_CLINIC_OR_DEPARTMENT_OTHER): Payer: BC Managed Care – PPO | Admitting: Nurse Practitioner

## 2021-04-19 ENCOUNTER — Other Ambulatory Visit: Payer: Self-pay

## 2021-04-19 ENCOUNTER — Encounter: Payer: Self-pay | Admitting: Nurse Practitioner

## 2021-04-19 ENCOUNTER — Inpatient Hospital Stay: Payer: BC Managed Care – PPO | Attending: Hematology

## 2021-04-19 VITALS — BP 109/70 | HR 76 | Temp 97.3°F | Resp 20 | Wt 236.6 lb

## 2021-04-19 VITALS — BP 105/58 | HR 68 | Temp 98.3°F | Resp 20

## 2021-04-19 DIAGNOSIS — C50211 Malignant neoplasm of upper-inner quadrant of right female breast: Secondary | ICD-10-CM | POA: Insufficient documentation

## 2021-04-19 DIAGNOSIS — Z170 Estrogen receptor positive status [ER+]: Secondary | ICD-10-CM | POA: Diagnosis not present

## 2021-04-19 DIAGNOSIS — N920 Excessive and frequent menstruation with regular cycle: Secondary | ICD-10-CM | POA: Diagnosis not present

## 2021-04-19 DIAGNOSIS — D500 Iron deficiency anemia secondary to blood loss (chronic): Secondary | ICD-10-CM | POA: Diagnosis not present

## 2021-04-19 DIAGNOSIS — Z1211 Encounter for screening for malignant neoplasm of colon: Secondary | ICD-10-CM

## 2021-04-19 LAB — COMPREHENSIVE METABOLIC PANEL
ALT: 12 U/L (ref 0–44)
AST: 16 U/L (ref 15–41)
Albumin: 3.9 g/dL (ref 3.5–5.0)
Alkaline Phosphatase: 40 U/L (ref 38–126)
Anion gap: 9 (ref 5–15)
BUN: 8 mg/dL (ref 6–20)
CO2: 19 mmol/L — ABNORMAL LOW (ref 22–32)
Calcium: 8.8 mg/dL — ABNORMAL LOW (ref 8.9–10.3)
Chloride: 110 mmol/L (ref 98–111)
Creatinine, Ser: 0.89 mg/dL (ref 0.44–1.00)
GFR, Estimated: >60 mL/min (ref >60)
Glucose, Bld: 108 mg/dL — ABNORMAL HIGH (ref 70–99)
Potassium: 3.5 mmol/L (ref 3.5–5.1)
Sodium: 138 mmol/L (ref 135–145)
Total Bilirubin: 0.5 mg/dL (ref 0.3–1.2)
Total Protein: 7 g/dL (ref 6.5–8.1)

## 2021-04-19 LAB — CBC WITH DIFFERENTIAL/PLATELET
Abs Immature Granulocytes: 0.01 10*3/uL (ref 0.00–0.07)
Basophils Absolute: 0 10*3/uL (ref 0.0–0.1)
Basophils Relative: 0 %
Eosinophils Absolute: 0.3 10*3/uL (ref 0.0–0.5)
Eosinophils Relative: 4 %
HCT: 31.2 % — ABNORMAL LOW (ref 36.0–46.0)
Hemoglobin: 9.8 g/dL — ABNORMAL LOW (ref 12.0–15.0)
Immature Granulocytes: 0 %
Lymphocytes Relative: 30 %
Lymphs Abs: 2.1 10*3/uL (ref 0.7–4.0)
MCH: 24.3 pg — ABNORMAL LOW (ref 26.0–34.0)
MCHC: 31.4 g/dL (ref 30.0–36.0)
MCV: 77.4 fL — ABNORMAL LOW (ref 80.0–100.0)
Monocytes Absolute: 0.4 10*3/uL (ref 0.1–1.0)
Monocytes Relative: 6 %
Neutro Abs: 4 10*3/uL (ref 1.7–7.7)
Neutrophils Relative %: 60 %
Platelets: 391 10*3/uL (ref 150–400)
RBC: 4.03 MIL/uL (ref 3.87–5.11)
RDW: 15.2 % (ref 11.5–15.5)
WBC: 6.9 10*3/uL (ref 4.0–10.5)
nRBC: 0 % (ref 0.0–0.2)

## 2021-04-19 LAB — FERRITIN: Ferritin: 6 ng/mL — ABNORMAL LOW (ref 11–307)

## 2021-04-19 LAB — IRON AND IRON BINDING CAPACITY (CC-WL,HP ONLY)
Iron: 23 ug/dL — ABNORMAL LOW (ref 28–170)
Saturation Ratios: 6 % — ABNORMAL LOW (ref 10.4–31.8)
TIBC: 421 ug/dL (ref 250–450)
UIBC: 398 ug/dL (ref 148–442)

## 2021-04-19 MED ORDER — LORATADINE 10 MG PO TABS
10.0000 mg | ORAL_TABLET | Freq: Once | ORAL | Status: AC
  Administered 2021-04-19: 10 mg via ORAL
  Filled 2021-04-19: qty 1

## 2021-04-19 MED ORDER — SODIUM CHLORIDE 0.9 % IV SOLN: INTRAVENOUS | Status: DC

## 2021-04-19 MED ORDER — SODIUM CHLORIDE 0.9 % IV SOLN
300.0000 mg | Freq: Once | INTRAVENOUS | Status: AC
  Administered 2021-04-19: 300 mg via INTRAVENOUS
  Filled 2021-04-19: qty 300

## 2021-04-19 NOTE — Progress Notes (Signed)
Pt declined 30 min post iron obs. VSS at discharge. 

## 2021-04-23 ENCOUNTER — Telehealth: Payer: Self-pay | Admitting: Hematology

## 2021-04-23 NOTE — Telephone Encounter (Signed)
Scheduled follow-up appointments per 1/19 los. Patient is aware. °

## 2021-04-26 DIAGNOSIS — G43909 Migraine, unspecified, not intractable, without status migrainosus: Secondary | ICD-10-CM | POA: Diagnosis not present

## 2021-04-26 DIAGNOSIS — R110 Nausea: Secondary | ICD-10-CM | POA: Diagnosis not present

## 2021-04-27 DIAGNOSIS — R7303 Prediabetes: Secondary | ICD-10-CM | POA: Diagnosis not present

## 2021-05-03 ENCOUNTER — Ambulatory Visit: Payer: BC Managed Care – PPO | Admitting: Physical Therapy

## 2021-05-03 ENCOUNTER — Inpatient Hospital Stay: Payer: BC Managed Care – PPO | Attending: Hematology

## 2021-05-03 ENCOUNTER — Other Ambulatory Visit: Payer: Self-pay

## 2021-05-03 VITALS — BP 97/57 | HR 66 | Temp 98.5°F | Resp 18

## 2021-05-03 DIAGNOSIS — D500 Iron deficiency anemia secondary to blood loss (chronic): Secondary | ICD-10-CM | POA: Diagnosis not present

## 2021-05-03 DIAGNOSIS — N920 Excessive and frequent menstruation with regular cycle: Secondary | ICD-10-CM | POA: Diagnosis not present

## 2021-05-03 MED ORDER — SODIUM CHLORIDE 0.9 % IV SOLN: Freq: Once | INTRAVENOUS | Status: AC

## 2021-05-03 MED ORDER — LORATADINE 10 MG PO TABS
10.0000 mg | ORAL_TABLET | Freq: Once | ORAL | Status: AC
  Administered 2021-05-03: 10 mg via ORAL
  Filled 2021-05-03: qty 1

## 2021-05-03 MED ORDER — SODIUM CHLORIDE 0.9 % IV SOLN
300.0000 mg | Freq: Once | INTRAVENOUS | Status: AC
  Administered 2021-05-03: 300 mg via INTRAVENOUS
  Filled 2021-05-03: qty 300

## 2021-05-03 NOTE — Patient Instructions (Signed)
Pt declines printed paperwork

## 2021-05-10 ENCOUNTER — Inpatient Hospital Stay: Payer: BC Managed Care – PPO

## 2021-05-10 ENCOUNTER — Other Ambulatory Visit: Payer: Self-pay

## 2021-05-10 VITALS — BP 111/66 | HR 79 | Temp 97.7°F | Resp 18

## 2021-05-10 DIAGNOSIS — D500 Iron deficiency anemia secondary to blood loss (chronic): Secondary | ICD-10-CM

## 2021-05-10 DIAGNOSIS — N920 Excessive and frequent menstruation with regular cycle: Secondary | ICD-10-CM | POA: Diagnosis not present

## 2021-05-10 MED ORDER — SODIUM CHLORIDE 0.9 % IV SOLN
300.0000 mg | Freq: Once | INTRAVENOUS | Status: AC
  Administered 2021-05-10: 300 mg via INTRAVENOUS
  Filled 2021-05-10: qty 300

## 2021-05-10 MED ORDER — SODIUM CHLORIDE 0.9 % IV SOLN: Freq: Once | INTRAVENOUS | Status: AC

## 2021-05-10 MED ORDER — LORATADINE 10 MG PO TABS
10.0000 mg | ORAL_TABLET | Freq: Once | ORAL | Status: AC
  Administered 2021-05-10: 10 mg via ORAL
  Filled 2021-05-10: qty 1

## 2021-05-10 NOTE — Patient Instructions (Signed)

## 2021-05-17 ENCOUNTER — Encounter: Payer: Self-pay | Admitting: Rehabilitation

## 2021-05-17 ENCOUNTER — Other Ambulatory Visit: Payer: Self-pay

## 2021-05-17 ENCOUNTER — Ambulatory Visit: Payer: BC Managed Care – PPO | Attending: Nurse Practitioner | Admitting: Rehabilitation

## 2021-05-17 DIAGNOSIS — Z9011 Acquired absence of right breast and nipple: Secondary | ICD-10-CM | POA: Insufficient documentation

## 2021-05-17 DIAGNOSIS — Z483 Aftercare following surgery for neoplasm: Secondary | ICD-10-CM | POA: Insufficient documentation

## 2021-05-17 DIAGNOSIS — C50211 Malignant neoplasm of upper-inner quadrant of right female breast: Secondary | ICD-10-CM | POA: Insufficient documentation

## 2021-05-17 DIAGNOSIS — M25611 Stiffness of right shoulder, not elsewhere classified: Secondary | ICD-10-CM | POA: Diagnosis not present

## 2021-05-17 DIAGNOSIS — Z170 Estrogen receptor positive status [ER+]: Secondary | ICD-10-CM | POA: Insufficient documentation

## 2021-05-17 NOTE — Therapy (Signed)
OUTPATIENT PHYSICAL THERAPY ONCOLOGY EVALUATION  Patient Name: Connie Underwood MRN: [id number] DOB:[birth year], 46 y.o., female Today's Date: 05/17/2021   PT End of Session - 05/17/21 1319     Visit Number 1    Number of Visits 8    Date for PT Re-Evaluation 07/12/21    Authorization Type no auth needed    Authorization - Number of Visits 58    PT Start Time 1232    PT Stop Time 1320    PT Time Calculation (min) 48 min    Activity Tolerance Patient tolerated treatment well    Behavior During Therapy [facility] for tasks assessed/performed             Past Medical History:  Diagnosis Date   Anemia    Anxiety    Breast cancer ([place])    right   Depression    Family history of prostate cancer 10/18/2020   Headache    migraines   Hx of migraines    Pre-diabetes    Seasonal allergic reaction    Past Surgical History:  Procedure Laterality Date   BREAST RECONSTRUCTION WITH PLACEMENT OF TISSUE EXPANDER AND FLEX HD (ACELLULAR HYDRATED DERMIS) Right 12/25/2020   Procedure: RIGHT BREAST RECONSTRUCTION WITH PLACEMENT OF TISSUE EXPANDER AND FLEX HD (ACELLULAR HYDRATED DERMIS);  Surgeon: Cindra Presume, MD;  Location: [place];  Service: Plastics;  Laterality: Right;   MASTECTOMY W/ SENTINEL NODE BIOPSY Right 12/25/2020   Procedure: RIGHT MASTECTOMY WITH RIGHT AXILLARY SENTINEL LYMPH NODE BIOPSY;  Surgeon: Rolm Bookbinder, MD;  Location: [place];  Service: General;  Laterality: Right;   MASTOPEXY Left 03/12/2021   Procedure: LEFT MASTOPEXY;  Surgeon: Cindra Presume, MD;  Location: [place];  Service: Plastics;  Laterality: Left;   REMOVAL OF BILATERAL TISSUE EXPANDERS WITH PLACEMENT OF BILATERAL BREAST IMPLANTS Right 03/12/2021   Procedure: REMOVAL OF RIGHT TISSUE EXPANDERS WITH PLACEMENT OF RIGHT  BREAST IMPLANTS;  Surgeon: Cindra Presume, MD;  Location: [place];  Service: Plastics;  Laterality: Right;   TUBAL LIGATION     Patient Active Problem List   Diagnosis Date Noted   Genetic  testing 10/25/2020   Family history of prostate cancer 10/18/2020   Iron deficiency anemia due to chronic blood loss 10/18/2020   Malignant neoplasm of upper-inner quadrant of right breast in female, estrogen receptor positive ([place]) 10/13/2020    PCP: Kristen Loader, FNP  REFERRING PROVIDER: Alla Feeling, NP  REFERRING DIAG: Rt breast cancer   THERAPY DIAG:  Rt shoulder stiffness, neoplasm aftercare   ONSET DATE: since surgery   SUBJECTIVE  SUBJECTIVE STATEMENT: I just always feel stiff especially when doing my hair  PERTINENT HISTORY:  Rt mastectomy with expanders on 12/25/20 with 0/2 LN positive with Dr. Donne Hazel and Dr. Claudia Desanctis. Removal of expanders 03/12/21.    PAIN:  Are you having pain? No  PRECAUTIONS: Other: lymphedema  WEIGHT BEARING RESTRICTIONS No  FALLS:  Has patient fallen in last 6 months? No,   LIVING ENVIRONMENT: Lives with: lives with their family, lives with their son, and lives with their daughter Lives in: House/apartment Stairs: Yes;    OCCUPATION: Works at a call center for [employer]: trying to walk and stretch  HAND DOMINANCE : right   PRIOR LEVEL OF FUNCTION: Independent  PATIENT GOALS Do my hair better, improve strength, is my arm swelling?   OBJECTIVE  COGNITION:  Overall cognitive status: Within functional limits for tasks assessed   PALPATION: Tightness and fullness in Rt axilla overhead, tightness pectoralis and latissimus but not tender   OBSERVATIONS / OTHER ASSESSMENTS: well healed Rt mastectomy with implant switch  SENSATION:  Still numb in the front  POSTURE: rounded shoulders, forward head  UPPER EXTREMITY AROM/PROM:  A/PROM RIGHT  05/17/2021   Shoulder extension 50  Shoulder flexion 132 tightness  Shoulder abduction 130  pectoralis tightness and shoulder hiking  Shoulder internal rotation   Shoulder external rotation 75    (Blank rows = not tested)  A/PROM LEFT  05/17/2021  Shoulder extension 60  Shoulder flexion 175  Shoulder abduction 178  Shoulder internal rotation   Shoulder external rotation 90    (Blank rows = not tested)   UPPER EXTREMITY STRENGTH:  All 4/5 bil no pain  LYMPHEDEMA ASSESSMENTS:   SURGERY TYPE/DATE: 12/25/20  NUMBER OF LYMPH NODES REMOVED: 2  CHEMOTHERAPY: no  RADIATION:no  HORMONE TREATMENT: yes, on Tamoxifen  INFECTIONS: no  LYMPHEDEMA ASSESSMENTS:   [place]MARK RIGHT  05/17/2021  10 cm proximal to olecranon process 37.5 - full tenson  Olecranon process 27.7  10 cm proximal to ulnar styloid process 24.1  Just proximal to ulnar styloid process 17  Across hand at thumb web space 20.2  At base of 2nd digit 5.7  (Blank rows = not tested)  [place] LEFT  05/17/2021  10 cm proximal to olecranon process 37.5  Olecranon process 27.5  10 cm proximal to ulnar styloid process 23.8  Just proximal to ulnar styloid process 17  Across hand at thumb web space 19.5  At base of 2nd digit 5.7  (Blank rows = not tested)     LDEX performed today for screening reasons - no signs of lymphedema   TODAY'S TREATMENT  Manual Therapy:   PROM with MFR to the Rt axilla into flexion, D2  Rt axilla MFR and STM with arm overhead position  Therapeutic Exercise:  Performed each per new HEP  Access Code: [email or web address]  Seated Lateral Trunk Stretch on Swiss Ball - 2 x daily - 7 x weekly - 1 sets - 5 reps - 5 second hold  Doorway Pec Stretch at 90 Degrees Abduction - 2 x daily - 7 x weekly - 1 sets - 3 reps - 20-30 seconds hold  Supine Shoulder Flexion with Dowel - 2 x daily - 7 x weekly - 1 sets - 10 reps - 5 second hold  Supine Lower Trunk Rotation - 2 x daily - 7 x weekly - 1 sets - 5 reps - 5 second hold  PATIENT EDUCATION:  Education details: HEP, POC Person educated:  Patient Education  method: Explanation, Demonstration, Tactile cues, Verbal cues, and Handouts Education comprehension: verbalized understanding and returned demonstration   HOME EXERCISE PROGRAM: Access Code: [email or web address]  ASSESSMENT:  CLINICAL IMPRESSION: Patient is a 46  y.o. female who was seen today for physical therapy evaluation and treatment for her continued Rt shoulder stiffness post mastectomy and implants. Pt has overall Rt axillary tightness but no overall pain.  Decreased AROM into all directions on the Rt.  Pt will benefit from PT at this time to improve reach, ability to do her hair, and to learn a HEP.    OBJECTIVE IMPAIRMENTS decreased ROM.   ACTIVITY LIMITATIONS  doing hair .   PERSONAL FACTORS 1 comorbidity: SLNB  are also affecting patient's functional outcome.    REHAB POTENTIAL: Excellent  CLINICAL DECISION MAKING: Stable/uncomplicated  EVALUATION COMPLEXITY: Low   GOALS: Goals reviewed with patient? Yes  Long TERM GOALS:  STG Name Target Date Goal status  1 Pt will improve Rt shoulder abduction to at least 160 to demonstrate improved reach with no shoulder hiking Baseline: 130 07/12/2021 INITIAL  2 Pt will be able to do her hair without having to lean against the wall Baseline:  07/12/2021 INITIAL  3 Pt will be ind with final HEP Baseline: 07/12/2021 INITIAL                       PLAN: PT FREQUENCY: 1-2x/week  PT DURATION: 8 weeks  PLANNED INTERVENTIONS: Therapeutic exercises, Patient/Family education, Joint mobilization, and Manual therapy  PLAN FOR NEXT SESSION: Rt axillary mobility including MFR, STM, PROM, stretches   Trica Usery R, PT 05/17/2021, 1:21 PM

## 2021-05-24 ENCOUNTER — Ambulatory Visit: Payer: BC Managed Care – PPO

## 2021-05-31 ENCOUNTER — Inpatient Hospital Stay: Payer: BC Managed Care – PPO | Attending: Hematology

## 2021-05-31 ENCOUNTER — Ambulatory Visit: Payer: BC Managed Care – PPO | Attending: Nurse Practitioner

## 2021-05-31 ENCOUNTER — Other Ambulatory Visit: Payer: Self-pay

## 2021-05-31 DIAGNOSIS — Z170 Estrogen receptor positive status [ER+]: Secondary | ICD-10-CM

## 2021-05-31 DIAGNOSIS — D500 Iron deficiency anemia secondary to blood loss (chronic): Secondary | ICD-10-CM | POA: Diagnosis not present

## 2021-05-31 DIAGNOSIS — M25611 Stiffness of right shoulder, not elsewhere classified: Secondary | ICD-10-CM | POA: Diagnosis not present

## 2021-05-31 DIAGNOSIS — C50211 Malignant neoplasm of upper-inner quadrant of right female breast: Secondary | ICD-10-CM | POA: Insufficient documentation

## 2021-05-31 DIAGNOSIS — Z483 Aftercare following surgery for neoplasm: Secondary | ICD-10-CM | POA: Diagnosis not present

## 2021-05-31 LAB — FERRITIN: Ferritin: 94 ng/mL (ref 11–307)

## 2021-05-31 LAB — CBC WITH DIFFERENTIAL/PLATELET
Abs Immature Granulocytes: 0.01 10*3/uL (ref 0.00–0.07)
Basophils Absolute: 0 10*3/uL (ref 0.0–0.1)
Basophils Relative: 0 %
Eosinophils Absolute: 0.3 10*3/uL (ref 0.0–0.5)
Eosinophils Relative: 4 %
HCT: 35.2 % — ABNORMAL LOW (ref 36.0–46.0)
Hemoglobin: 11.5 g/dL — ABNORMAL LOW (ref 12.0–15.0)
Immature Granulocytes: 0 %
Lymphocytes Relative: 30 %
Lymphs Abs: 2.2 10*3/uL (ref 0.7–4.0)
MCH: 26 pg (ref 26.0–34.0)
MCHC: 32.7 g/dL (ref 30.0–36.0)
MCV: 79.6 fL — ABNORMAL LOW (ref 80.0–100.0)
Monocytes Absolute: 0.4 10*3/uL (ref 0.1–1.0)
Monocytes Relative: 6 %
Neutro Abs: 4.6 10*3/uL (ref 1.7–7.7)
Neutrophils Relative %: 60 %
Platelets: 321 10*3/uL (ref 150–400)
RBC: 4.42 MIL/uL (ref 3.87–5.11)
RDW: 19.3 % — ABNORMAL HIGH (ref 11.5–15.5)
WBC: 7.5 10*3/uL (ref 4.0–10.5)
nRBC: 0 % (ref 0.0–0.2)

## 2021-05-31 LAB — COMPREHENSIVE METABOLIC PANEL
ALT: 9 U/L (ref 0–44)
AST: 14 U/L — ABNORMAL LOW (ref 15–41)
Albumin: 3.9 g/dL (ref 3.5–5.0)
Alkaline Phosphatase: 41 U/L (ref 38–126)
Anion gap: 6 (ref 5–15)
BUN: 12 mg/dL (ref 6–20)
CO2: 21 mmol/L — ABNORMAL LOW (ref 22–32)
Calcium: 8.8 mg/dL — ABNORMAL LOW (ref 8.9–10.3)
Chloride: 111 mmol/L (ref 98–111)
Creatinine, Ser: 0.87 mg/dL (ref 0.44–1.00)
GFR, Estimated: >60 mL/min (ref >60)
Glucose, Bld: 101 mg/dL — ABNORMAL HIGH (ref 70–99)
Potassium: 3.6 mmol/L (ref 3.5–5.1)
Sodium: 138 mmol/L (ref 135–145)
Total Bilirubin: 0.3 mg/dL (ref 0.3–1.2)
Total Protein: 7 g/dL (ref 6.5–8.1)

## 2021-05-31 LAB — IRON AND IRON BINDING CAPACITY (CC-WL,HP ONLY)
Iron: 41 ug/dL (ref 28–170)
Saturation Ratios: 13 % (ref 10.4–31.8)
TIBC: 325 ug/dL (ref 250–450)
UIBC: 284 ug/dL (ref 148–442)

## 2021-05-31 NOTE — Therapy (Deleted)
.  OPRCPT ?

## 2021-05-31 NOTE — Therapy (Signed)
?OUTPATIENT PHYSICAL THERAPY TREATMENT NOTE ? ? ?Patient Name: Connie Underwood ?MRN: [id number] ?DOB:1975-06-30, 46 y.o., female ?Today's Date: 05/31/2021 ? ?PCP: Kristen Loader, FNP ?REFERRING PROVIDER: Alla Feeling, NP ? ? PT End of Session - 05/31/21 1002   ? ? Visit Number 2   ? Number of Visits 8   ? Date for PT Re-Evaluation 07/12/21   ? Authorization Type no auth needed   ? PT Start Time 1005   ? PT Stop Time 1047   ? PT Time Calculation (min) 42 min   ? ?  ?  ? ?  ? ? ?Past Medical History:  ?Diagnosis Date  ? Anemia   ? Anxiety   ? Breast cancer ([place])   ? right  ? Depression   ? Family history of prostate cancer 10/18/2020  ? Headache   ? migraines  ? Hx of migraines   ? Pre-diabetes   ? Seasonal allergic reaction   ? ?Past Surgical History:  ?Procedure Laterality Date  ? BREAST RECONSTRUCTION WITH PLACEMENT OF TISSUE EXPANDER AND FLEX HD (ACELLULAR HYDRATED DERMIS) Right 12/25/2020  ? Procedure: RIGHT BREAST RECONSTRUCTION WITH PLACEMENT OF TISSUE EXPANDER AND FLEX HD (ACELLULAR HYDRATED DERMIS);  Surgeon: Cindra Presume, MD;  Location: [place];  Service: Plastics;  Laterality: Right;  ? MASTECTOMY W/ SENTINEL NODE BIOPSY Right 12/25/2020  ? Procedure: RIGHT MASTECTOMY WITH RIGHT AXILLARY SENTINEL LYMPH NODE BIOPSY;  Surgeon: Rolm Bookbinder, MD;  Location: [place];  Service: General;  Laterality: Right;  ? MASTOPEXY Left 03/12/2021  ? Procedure: LEFT MASTOPEXY;  Surgeon: Cindra Presume, MD;  Location: [place];  Service: Plastics;  Laterality: Left;  ? REMOVAL OF BILATERAL TISSUE EXPANDERS WITH PLACEMENT OF BILATERAL BREAST IMPLANTS Right 03/12/2021  ? Procedure: REMOVAL OF RIGHT TISSUE EXPANDERS WITH PLACEMENT OF RIGHT  BREAST IMPLANTS;  Surgeon: Cindra Presume, MD;  Location: [place];  Service: Plastics;  Laterality: Right;  ? TUBAL LIGATION    ? ?Patient Active Problem List  ? Diagnosis Date Noted  ? Genetic testing 10/25/2020  ? Family history of prostate cancer 10/18/2020  ? Iron deficiency anemia  due to chronic blood loss 10/18/2020  ? Malignant neoplasm of upper-inner quadrant of right breast in female, estrogen receptor positive ([place]) 10/13/2020  ? ? ?REFERRING DIAG: Right breast Cancer ? ?THERAPY DIAG:  ?Malignant neoplasm of upper-inner quadrant of right breast in female, estrogen receptor positive ([place]) ? ?Stiffness of right shoulder, not elsewhere classified ? ?Aftercare following surgery for neoplasm ? ?PERTINENT HISTORY: Rt mastectomy with expanders on 12/25/20 with 0/2 LN positive with Dr. Donne Hazel and Dr. Claudia Desanctis. Removal of expanders 03/12/21.   ?  ? ?PRECAUTIONS: lymphedema ? ?SUBJECTIVE: I am feeling a lot looser. More numbish than pain. I feel like I have more mobility. I can do my hair better now. ?PAIN:  ?Are you having pain? No ? ? ? ?05/17/2021 ? ?COGNITION: ?           Overall cognitive status: Within functional limits for tasks assessed  ?  ?PALPATION: Tightness and fullness in Rt axilla overhead, tightness pectoralis and latissimus but not tender  ?  ?OBSERVATIONS / OTHER ASSESSMENTS: well healed Rt mastectomy with implant switch ?  ?SENSATION: ?           Still numb in the front ?  ?POSTURE: rounded shoulders, forward head ?  ?UPPER EXTREMITY AROM/PROM: ?  ?A/PROM RIGHT  05/17/2021 ?  05/31/2021  ?Shoulder extension 50 65  ?Shoulder flexion  132 tightness 158  ?Shoulder abduction 130 pectoralis tightness and shoulder hiking 169  ?Shoulder internal rotation     ?Shoulder external rotation 75 90  ?                        (Blank rows = not tested) ?  ?A/PROM LEFT  05/17/2021  ?Shoulder extension 60  ?Shoulder flexion 175  ?Shoulder abduction 178  ?Shoulder internal rotation    ?Shoulder external rotation 90  ?                        (Blank rows = not tested) ?  ?  ?UPPER EXTREMITY STRENGTH:  ?All 4/5 bil no pain ?  ?LYMPHEDEMA ASSESSMENTS:  ?  ?SURGERY TYPE/DATE: 12/25/20 ?  ?NUMBER OF LYMPH NODES REMOVED: 2 ?  ?CHEMOTHERAPY: no ?  ?RADIATION:no ?  ?HORMONE TREATMENT: yes, on Tamoxifen ?   ?INFECTIONS: no ?  ?LYMPHEDEMA ASSESSMENTS:  ?  ?[place] RIGHT  05/17/2021  ?10 cm proximal to olecranon process 37.5 - full tenson  ?Olecranon process 27.7  ?10 cm proximal to ulnar styloid process 24.1  ?Just proximal to ulnar styloid process 17  ?Across hand at thumb web space 20.2  ?At base of 2nd digit 5.7  ?(Blank rows = not tested) ?  ?[place] LEFT  05/17/2021  ?10 cm proximal to olecranon process 37.5  ?Olecranon process 27.5  ?10 cm proximal to ulnar styloid process 23.8  ?Just proximal to ulnar styloid process 17  ?Across hand at thumb web space 19.5  ?At base of 2nd digit 5.7  ?(Blank rows = not tested) ?  ?  ?  ?  ?LDEX performed today for screening reasons - no signs of lymphedema  ?  ?TODAY'S TREATMENT  ?05/31/2021 ?Soft tissue mobilization right pectorals, lats and UT with cocoa butter, PROM right shoulder flexion, scaption, abd, D2 flexion, MFR to right axillary region during stretching ?Supine wand flexion, scaption x 5, LTR to the left x 5, supine scapular series all x 5 with yellow TB ? ? ?05/17/2021 ?Manual Therapy:  ?   PROM with MFR to the Rt axilla into flexion, D2 ?   Rt axilla MFR and STM with arm overhead position ?  ?Therapeutic Exercise: ?Supine scapular series x 5; given illustrated and written instructions and a yellow band ?   PATIENT EDUCATION:  ?Education details: supine scapular series with yellow band ?Person educated: Patient ?Education method: Explanation, Demonstration, Tactile cues, Verbal cues, and Handouts ?Education comprehension: verbalized understanding and returned demonstration ?  ?  ?HOME EXERCISE PROGRAM: ?Access Code: [email or web address] ?  ?ASSESSMENT: ?  ?CLINICAL IMPRESSION: ?Pt has been compliant with her HEP and demonstrates excellent improvement in shoulder ROM. Tightness noted with soft tissue mobilization but only minimal tenderness.  Function improving and pt is now able to do her hair better ?  ?  ?OBJECTIVE IMPAIRMENTS decreased ROM.  ?  ?ACTIVITY LIMITATIONS  doing  hair .  ?  ?PERSONAL FACTORS 1 comorbidity: SLNB  are also affecting patient's functional outcome.  ?  ?  ?REHAB POTENTIAL: Excellent ?  ?CLINICAL DECISION MAKING: Stable/uncomplicated ?  ?EVALUATION COMPLEXITY: Low ?  ?  ?GOALS: ?Goals reviewed with patient? Yes ?  ?Long TERM GOALS: ?  ?STG Name Target Date Goal status  ?1 Pt will improve Rt shoulder abduction to at least 160 to demonstrate improved reach with no shoulder hiking ?Baseline: 130 07/12/2021 INITIAL  ?2 Pt will be able  to do her hair without having to lean against the wall ?Baseline:  07/12/2021 INITIAL  ?3 Pt will be ind with final HEP ?Baseline: 07/12/2021 INITIAL  ?         ?         ?         ?         ?  ?PLAN: ?PT FREQUENCY: 1-2x/week ?  ?PT DURATION: 8 weeks ?  ?PLANNED INTERVENTIONS: Therapeutic exercises, Patient/Family education, Joint mobilization, and Manual therapy ?  ?PLAN FOR NEXT SESSION: Rt axillary mobility including MFR, STM, PROM, stretches, review supine scap series and increase reps,standing jobes ?  ?  ? ? ? ?Claris Pong, PT ?05/31/2021, 10:52 AM ? ?  ? ?

## 2021-05-31 NOTE — Patient Instructions (Signed)

## 2021-06-06 ENCOUNTER — Encounter: Payer: Self-pay | Admitting: Hematology

## 2021-06-06 NOTE — Progress Notes (Signed)
Patient sent email regarding status of expense submitted by grant and also wanted to schedule appointment regarding additional resources. ? ?Followed up on status of expense with Accounts Pay[employer] and received information. Reviewed notes from social worker previously regarding additional resources. ? ?Emailed patient back to provide status of expense and provided contact number for social workers to follow up. ? ?She has my contact name and number for any additional financial questions or concerns. ?

## 2021-06-07 ENCOUNTER — Ambulatory Visit: Payer: BC Managed Care – PPO | Admitting: Rehabilitation

## 2021-06-07 ENCOUNTER — Encounter: Payer: Self-pay | Admitting: Rehabilitation

## 2021-06-07 ENCOUNTER — Other Ambulatory Visit: Payer: Self-pay

## 2021-06-07 DIAGNOSIS — Z170 Estrogen receptor positive status [ER+]: Secondary | ICD-10-CM

## 2021-06-07 DIAGNOSIS — Z483 Aftercare following surgery for neoplasm: Secondary | ICD-10-CM | POA: Diagnosis not present

## 2021-06-07 DIAGNOSIS — M25611 Stiffness of right shoulder, not elsewhere classified: Secondary | ICD-10-CM

## 2021-06-07 DIAGNOSIS — C50211 Malignant neoplasm of upper-inner quadrant of right female breast: Secondary | ICD-10-CM | POA: Diagnosis not present

## 2021-06-07 NOTE — Therapy (Signed)
OUTPATIENT PHYSICAL THERAPY TREATMENT NOTE   Patient Name: Connie Underwood MRN: [id number] DOB:1975-11-01, 46 y.o., female 58 Date: 06/07/2021  PCP: Kristen Loader, FNP REFERRING PROVIDER: Alla Feeling, NP   PT End of Session - 06/07/21 1232     Visit Number 3    Number of Visits 8    Date for PT Re-Evaluation 07/12/21    PT Start Time 1232    PT Stop Time 1310    PT Time Calculation (min) 38 min    Activity Tolerance Patient tolerated treatment well    Behavior During Therapy [facility] for tasks assessed/performed              Past Medical History:  Diagnosis Date   Anemia    Anxiety    Breast cancer ([place])    right   Depression    Family history of prostate cancer 10/18/2020   Headache    migraines   Hx of migraines    Pre-diabetes    Seasonal allergic reaction    Past Surgical History:  Procedure Laterality Date   BREAST RECONSTRUCTION WITH PLACEMENT OF TISSUE EXPANDER AND FLEX HD (ACELLULAR HYDRATED DERMIS) Right 12/25/2020   Procedure: RIGHT BREAST RECONSTRUCTION WITH PLACEMENT OF TISSUE EXPANDER AND FLEX HD (ACELLULAR HYDRATED DERMIS);  Surgeon: Cindra Presume, MD;  Location: [place];  Service: Plastics;  Laterality: Right;   MASTECTOMY W/ SENTINEL NODE BIOPSY Right 12/25/2020   Procedure: RIGHT MASTECTOMY WITH RIGHT AXILLARY SENTINEL LYMPH NODE BIOPSY;  Surgeon: Rolm Bookbinder, MD;  Location: [place];  Service: General;  Laterality: Right;   MASTOPEXY Left 03/12/2021   Procedure: LEFT MASTOPEXY;  Surgeon: Cindra Presume, MD;  Location: [place];  Service: Plastics;  Laterality: Left;   REMOVAL OF BILATERAL TISSUE EXPANDERS WITH PLACEMENT OF BILATERAL BREAST IMPLANTS Right 03/12/2021   Procedure: REMOVAL OF RIGHT TISSUE EXPANDERS WITH PLACEMENT OF RIGHT  BREAST IMPLANTS;  Surgeon: Cindra Presume, MD;  Location: [place];  Service: Plastics;  Laterality: Right;   TUBAL LIGATION     Patient Active Problem List   Diagnosis Date Noted   Genetic testing  10/25/2020   Family history of prostate cancer 10/18/2020   Iron deficiency anemia due to chronic blood loss 10/18/2020   Malignant neoplasm of upper-inner quadrant of right breast in female, estrogen receptor positive ([place]) 10/13/2020    REFERRING DIAG: Right breast Cancer  THERAPY DIAG:  Malignant neoplasm of upper-inner quadrant of right breast in female, estrogen receptor positive ([place])  Stiffness of right shoulder, not elsewhere classified  Aftercare following surgery for neoplasm  PERTINENT HISTORY: Rt mastectomy with expanders on 12/25/20 with 0/2 LN positive with Dr. Donne Hazel and Dr. Claudia Desanctis. Removal of expanders 03/12/21.     PRECAUTIONS: lymphedema risk Rt UE  SUBJECTIVE:  I feel like I have gotten all my motion back.  I feel good, but I would like to finish out my appts anyway  PAIN:  Are you having pain? No  TODAY'S TREATMENT  06/07/21 Rechecked AROM Pulleys abduction x 105mn with instruction Wall ball abduction and flexion 5" x 5 Sidelying abduction with focus on scapular setting which decreases pts impingement overall.  No questions on supine scap Manual Therapy:  PROM with MFR to the Rt axilla into flexion, D2 Rt axilla MFR and STM with arm overhead position   05/31/21 Manual Therapy:  PROM with MFR to the Rt axilla into flexion, D2 Rt axilla MFR and STM with arm overhead position  Therapeutic Exercise: Supine  scapular series x 5; given illustrated and written instructions and a yellow band  05/17/21 Eval Soft tissue mobilization right pectorals, lats and UT with cocoa butter, PROM right shoulder flexion, scaption, abd, D2 flexion, MFR to right axillary region during stretching Supine wand flexion, scaption x 5, LTR to the left x 5, supine scapular series all x 5 with yellow TB  PATIENT EDUCATION:  Education details: 05/31/21: supine scapular series with yellow band  HOME EXERCISE PROGRAM: Access Code: [email or web address] and supine scap   UPPER EXTREMITY AROM/PROM:    A/PROM RIGHT  05/17/2021   05/31/2021 06/07/21  Shoulder extension 50 65 65  Shoulder flexion 132 tightness 158 173  Shoulder abduction 130 pectoralis tightness and shoulder hiking 169 170  Shoulder internal rotation      Shoulder external rotation 75 90                           (Blank rows = not tested)   A/PROM LEFT  05/17/2021  Shoulder extension 60  Shoulder flexion 175  Shoulder abduction 178  Shoulder internal rotation    Shoulder external rotation 90                          (Blank rows = not tested)    05/17/21: LDEX performed today for screening reasons - no signs of lymphedema       ASSESSMENT  CLINICAL IMPRESSION: Pt has been compliant with her HEP and demonstrates excellent improvement in shoulder ROM. Abnormal scapulohumeral rhythm noted with abduction AROM which was improved with scapular setting prior to motion. Tightness noted with soft tissue mobilization but only no tenderness.  Function improving and pt is now able to do her hair better.   GOALS: Goals reviewed with patient? Yes   Long TERM GOALS:   STG Name Target Date Goal status  1 Pt will improve Rt shoulder abduction to at least 160 to demonstrate improved reach with no shoulder hiking Baseline: 130 07/12/2021 MET  2 Pt will be able to do her hair without having to lean against the wall Baseline:  07/12/2021 MET  3 Pt will be ind with final HEP Baseline: 07/12/2021 INITIAL                                        PLAN: PT FREQUENCY: 1-2x/week   PT DURATION: 8 weeks   PLANNED INTERVENTIONS: Therapeutic exercises, Patient/Family education, Joint mobilization, and Manual therapy   PLAN FOR NEXT SESSION: Rt axillary mobility including MFR, STM, PROM, stretches, review supine scap series and increase reps,standing jobes        Alara Daniel, Adrian Prince, PT 06/07/2021, 1:12 PM

## 2021-06-08 DIAGNOSIS — J069 Acute upper respiratory infection, unspecified: Secondary | ICD-10-CM | POA: Diagnosis not present

## 2021-06-11 DIAGNOSIS — J018 Other acute sinusitis: Secondary | ICD-10-CM | POA: Diagnosis not present

## 2021-06-11 DIAGNOSIS — J040 Acute laryngitis: Secondary | ICD-10-CM | POA: Diagnosis not present

## 2021-06-14 ENCOUNTER — Encounter: Payer: BC Managed Care – PPO | Admitting: Rehabilitation

## 2021-06-19 DIAGNOSIS — J019 Acute sinusitis, unspecified: Secondary | ICD-10-CM | POA: Diagnosis not present

## 2021-06-21 ENCOUNTER — Ambulatory Visit: Payer: BC Managed Care – PPO | Admitting: Rehabilitation

## 2021-06-28 ENCOUNTER — Encounter: Payer: BC Managed Care – PPO | Admitting: Rehabilitation

## 2021-07-07 ENCOUNTER — Ambulatory Visit (HOSPITAL_COMMUNITY): Payer: Self-pay

## 2021-07-07 DIAGNOSIS — J029 Acute pharyngitis, unspecified: Secondary | ICD-10-CM | POA: Diagnosis not present

## 2021-07-18 ENCOUNTER — Encounter: Payer: Self-pay | Admitting: Plastic Surgery

## 2021-07-18 ENCOUNTER — Ambulatory Visit: Payer: BC Managed Care – PPO | Admitting: Plastic Surgery

## 2021-07-18 DIAGNOSIS — Z9889 Other specified postprocedural states: Secondary | ICD-10-CM | POA: Diagnosis not present

## 2021-07-18 NOTE — Progress Notes (Signed)
? ?  Referring Provider ?Kristen Loader, FNP ?[place] ?[place],   [id number]  ? ?CC:  ?Chief Complaint  ?Patient presents with  ? Follow-up  ?   ? ?Connie Underwood is an 46 y.o. female.  ?HPI: Patient presents in follow-up for breast reconstruction.  She had a total implant-based reconstruction on the right and a mastopexy on the left.  She has been overall very happy with the result.  Several days ago she noted what she perceives as a change in the shape in the right side and felt like the implant was migrating out laterally.  She had performed some gentle massage in the implant area and feels like the problem has been fixed but wants to get checked for reassurance. ? ?Review of Systems ?General: Denies fevers and chills ? ?Physical Exam ? ?  05/10/2021  ?  4:45 PM 05/10/2021  ?  2:13 PM 05/03/2021  ? 10:26 AM  ?Vitals with BMI  ?Systolic 950 932 97  ?Diastolic 66 78 57  ?Pulse 79 85 66  ?  ?General:  No acute distress,  Alert and oriented, Non-Toxic, Normal speech and affect ?Examination shows what looks to be a consistent shape from previous pictures of her reconstruction.  She has very nice symmetry and overall a nice result considering reconstruction on 1 side in a natural left side. ? ?Assessment/Plan ?Patient presents in follow-up with concerns for change in shape of the right reconstructed breast.  I am assuming that this was due to some movement of the implant within the pocket.  It looks to have fixed itself.  She appears to have a good stable results in the clinic today.  I did explain that the implants can move around in the pocket and it likely contributed to her perceived change.  For the time being she is happy and we will plan to revisit this in a few months with her planned follow-up visit with me.  All her questions were answered. ? ?Cindra Presume ?07/18/2021, 10:36 AM  ? ? ?    ?

## 2021-07-19 DIAGNOSIS — W540XXA Bitten by dog, initial encounter: Secondary | ICD-10-CM | POA: Diagnosis not present

## 2021-07-19 DIAGNOSIS — S61431A Puncture wound without foreign body of right hand, initial encounter: Secondary | ICD-10-CM | POA: Diagnosis not present

## 2021-07-26 ENCOUNTER — Inpatient Hospital Stay: Payer: BC Managed Care – PPO | Admitting: Hematology

## 2021-07-26 ENCOUNTER — Inpatient Hospital Stay: Payer: BC Managed Care – PPO

## 2021-07-26 ENCOUNTER — Telehealth: Payer: Self-pay | Admitting: Hematology

## 2021-07-26 DIAGNOSIS — Z170 Estrogen receptor positive status [ER+]: Secondary | ICD-10-CM

## 2021-07-26 NOTE — Telephone Encounter (Signed)
Cancelled appt per 4/27 los, offered to r/s but pt said they will call back to r/s ?

## 2021-08-07 ENCOUNTER — Other Ambulatory Visit: Payer: Self-pay | Admitting: Hematology

## 2021-08-07 ENCOUNTER — Other Ambulatory Visit: Payer: Self-pay | Admitting: General Surgery

## 2021-08-07 DIAGNOSIS — Z171 Estrogen receptor negative status [ER-]: Secondary | ICD-10-CM

## 2021-08-22 ENCOUNTER — Ambulatory Visit
Admission: RE | Admit: 2021-08-22 | Discharge: 2021-08-22 | Disposition: A | Discharge status: Home / Self Care | Payer: BC Managed Care – PPO | Source: Ambulatory Visit | Attending: General Surgery | Admitting: General Surgery

## 2021-08-22 DIAGNOSIS — Z853 Personal history of malignant neoplasm of breast: Secondary | ICD-10-CM | POA: Diagnosis not present

## 2021-08-22 DIAGNOSIS — Z171 Estrogen receptor negative status [ER-]: Secondary | ICD-10-CM

## 2021-08-22 IMAGING — MR MR BREAST BILAT WO/W CM
8 of 13 series · 32 of 48 positions shown · IV contrast (10 ml gadavist)
Comparison: [DATE], [DATE] and earlier studies

CLINICAL DATA: History of RIGHT breast cancer, history of RIGHT
breast cancer diagnosed in [DATE]. Patient underwent
mastectomy and implant reconstruction in [DATE]. MRI
performed on [DATE] showed linear clumped enhancement in the
UPPER INNER QUADRANT of the LEFT breast for which biopsy was
recommended. When the patient return for biopsy on [DATE], the
enhancement was no longer present. It was recommended the patient
return for MRI in 6 months. Patient felt a lump in the LEFT breast 3
weeks ago.

EXAM:
BILATERAL BREAST MRI WITH AND WITHOUT CONTRAST
TECHNIQUE: Multiplanar, multisequence MR images of both breasts were obtained
prior to and following the intravenous administration of 10 ml of
Gadavist

[Series 2: t2_tirm_tra ipat (a-p) · axial · 3.0mm · 0.72mm/px · 1 of 55 slices shown]
[im 1/55]
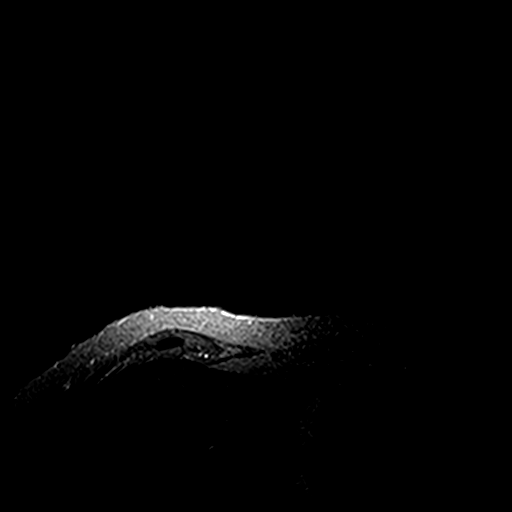

[Series 5: fl3d pre-cm no · axial · non-contrast · 1.2mm · 0.96mm/px · z∈[-58,+114]mm · 5 of 144 slices shown]
[im 1/144]
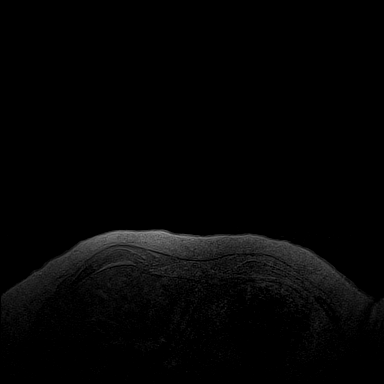
[im 36/144]
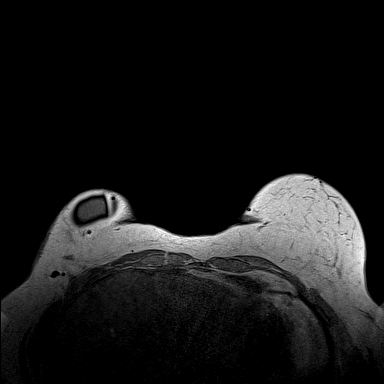
[im 72/144]
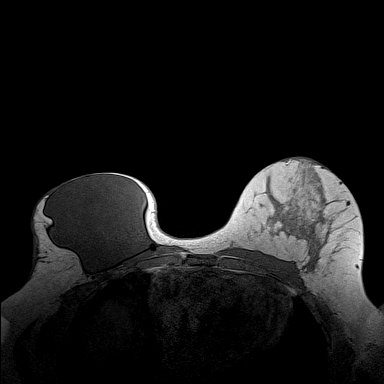
[im 108/144]
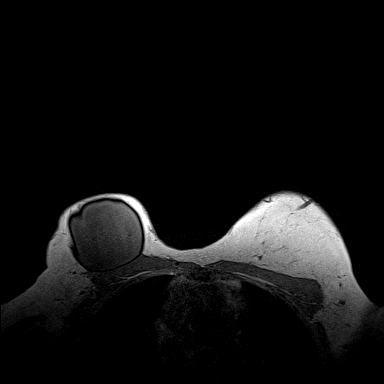
[im 144/144]
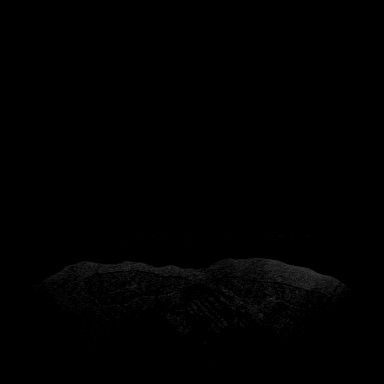

[Series 8: fl3d pre-cm · axial · non-contrast · 1.2mm · 0.96mm/px · z∈[-58,+114]mm · 5 of 144 slices shown]
[im 1/144]
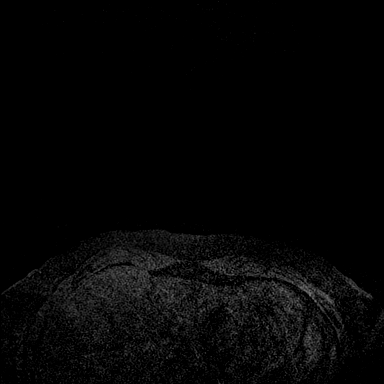
[im 36/144]
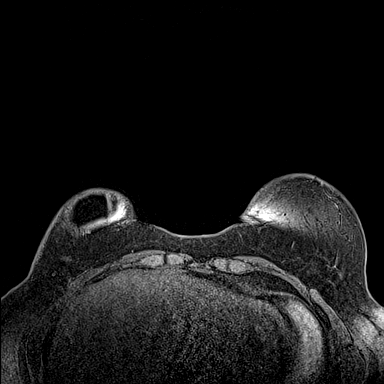
[im 72/144]
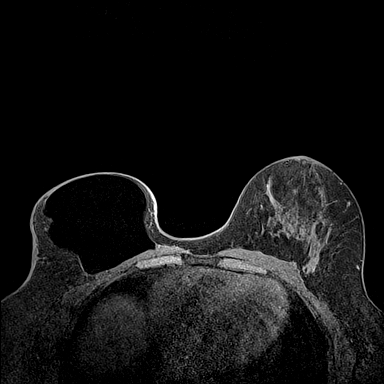
[im 108/144]
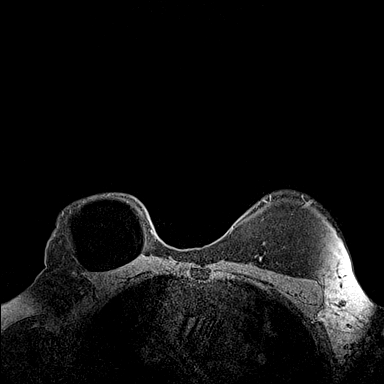
[im 144/144]
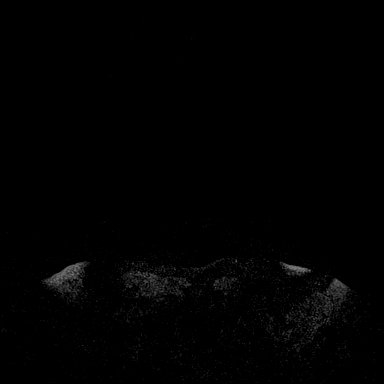

[Series 9: fl3d post-cm 20 · axial · 1.2mm · 0.96mm/px · z∈[-58,+114]mm · 5 of 144 slices shown (1 of 3)]
[im 1/144]
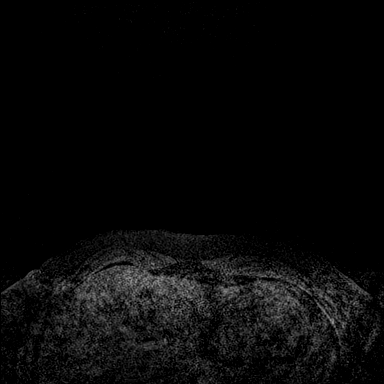
[im 36/144]
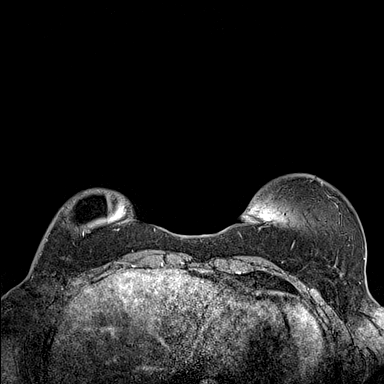
[im 72/144]
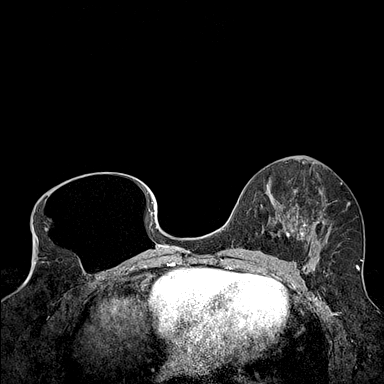
[im 108/144]
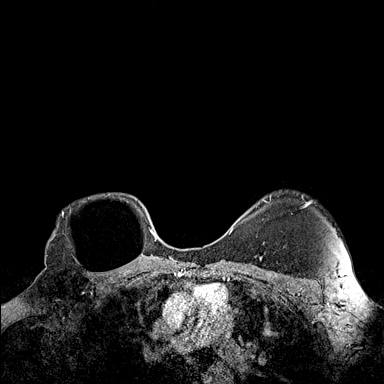
[im 144/144]
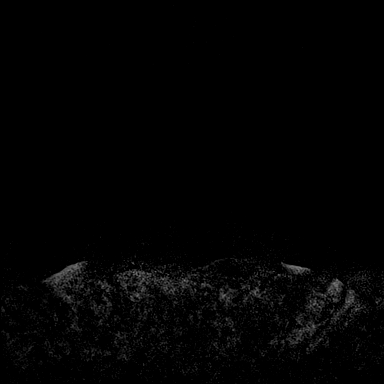

[Series 10: fl3d post-cm 20 · axial · 1.2mm · 0.96mm/px · z∈[-58,+114]mm · 5 of 144 slices shown (2 of 3)]
[im 1/144]
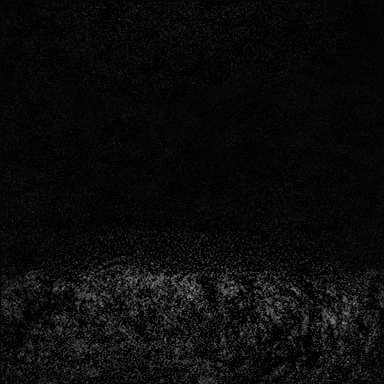
[im 36/144]
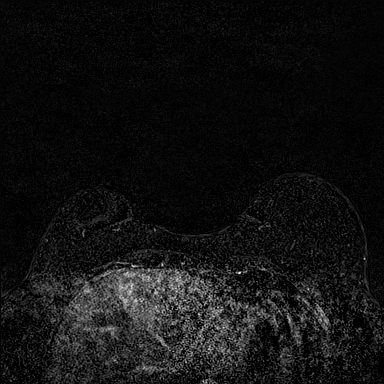
[im 72/144]
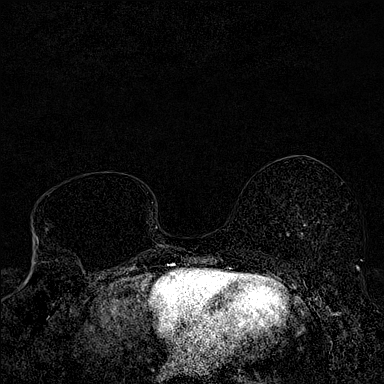
[im 108/144]
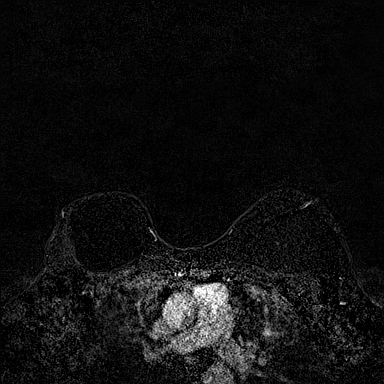
[im 144/144]
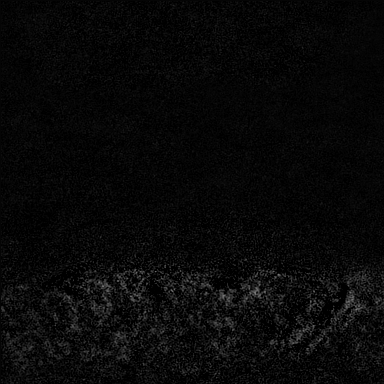

[Series 11: fl3d post-cm 20 · axial · 172.8mm · 0.96mm/px · 1 of 1 slices shown (3 of 3)]
[im 1/1]
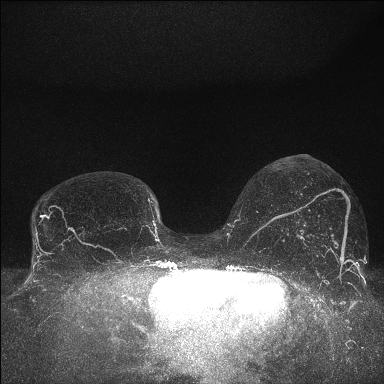

[Series 12: fl3d post-cm 3 · axial · 1.2mm · 0.96mm/px · z∈[-58,+114]mm · 5 of 144 slices shown (1 of 2)]
[im 1/144]
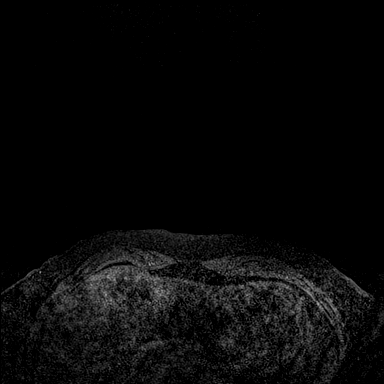
[im 36/144]
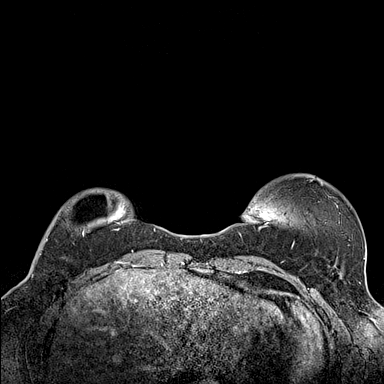
[im 72/144]
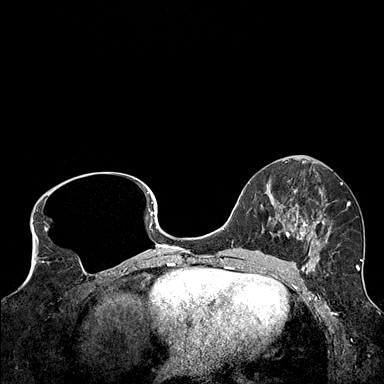
[im 108/144]
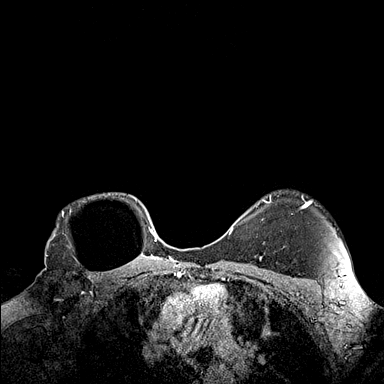
[im 144/144]
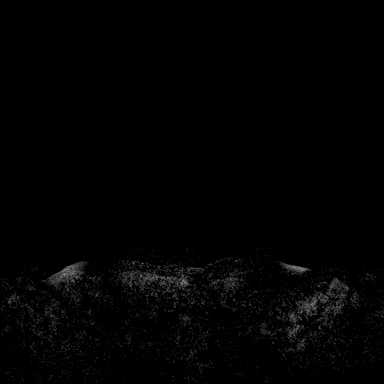

[Series 13: fl3d post-cm 3 · axial · 1.2mm · 0.96mm/px · z∈[-58,+79]mm · 5 of 144 slices shown (2 of 2)]
[im 1/144]
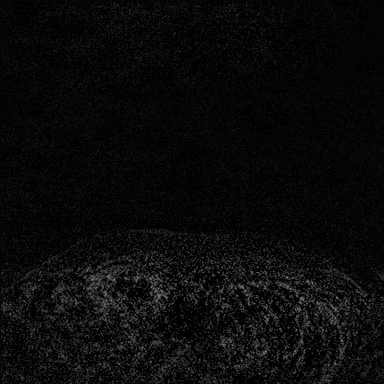
[im 29/144]
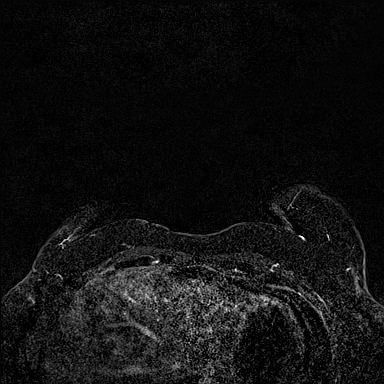
[im 58/144]
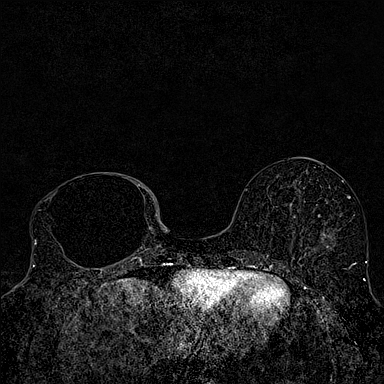
[im 86/144]
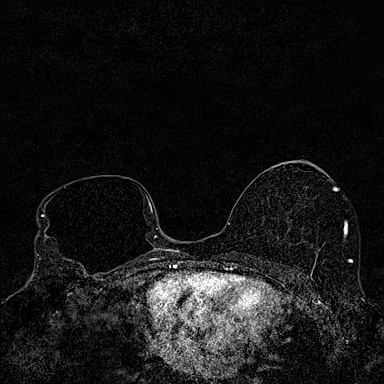
[im 115/144]
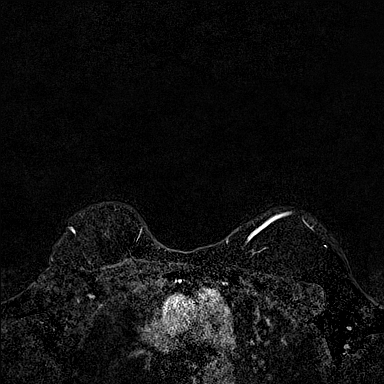

[32 of 48 positions shown; findings below may reference images not displayed]

Three-dimensional MR images were rendered by post-processing of the
original MR data on an independent workstation. The
three-dimensional MR images were interpreted, and findings are
reported in the following complete MRI report for this study. Three
dimensional images were evaluated at the independent interpreting
workstation using the DynaCAD thin client.
FINDINGS: Breast composition: c. Heterogeneous fibroglandular tissue.

Background parenchymal enhancement: Mild

Right breast: Status post mastectomy with retropectoral implant. No
abnormal enhancement.

Left breast: Within the LOWER OUTER QUADRANT of the LEFT breast,
there is focal non mass clumped linear enhancement spanning 1.5 x
0.5 centimeters (image 101 of series 10). This area demonstrates
washout type kinetics and warrants further evaluation with biopsy.
Otherwise the LEFT breast is negative. Specifically, there is no non
mass clumped linear enhancement in the UPPER INNER QUADRANT as was
seen on MRI performed [DATE].

Lymph nodes: No abnormal appearing lymph nodes.

Ancillary findings:  None.
IMPRESSION: 1. Expected postoperative changes in the RIGHT breast.
2. Focal linear non mass clumped enhancement in the LOWER OUTER
QUADRANT of the LEFT breast spanning 1.5 centimeters.

RECOMMENDATION:
1. Recommend MR guided core biopsy of non mass enhancement in the
LOWER OUTER QUADRANT of the LEFT breast.
2. Prior to biopsy, recommended LEFT diagnostic mammogram and
ultrasound of the reported palpable mass in the LEFT breast.

BI-RADS CATEGORY  4: Suspicious.

## 2021-08-22 MED ORDER — GADOBUTROL 1 MMOL/ML IV SOLN
10.0000 mL | Freq: Once | INTRAVENOUS | Status: AC | PRN
  Administered 2021-08-22: 10 mL via INTRAVENOUS

## 2021-08-28 ENCOUNTER — Other Ambulatory Visit: Payer: Self-pay | Admitting: General Surgery

## 2021-08-28 DIAGNOSIS — R928 Other abnormal and inconclusive findings on diagnostic imaging of breast: Secondary | ICD-10-CM

## 2021-08-30 ENCOUNTER — Ambulatory Visit
Admission: RE | Admit: 2021-08-30 | Discharge: 2021-08-30 | Disposition: A | Discharge status: Home / Self Care | Payer: BC Managed Care – PPO | Source: Ambulatory Visit | Attending: General Surgery | Admitting: General Surgery

## 2021-08-30 ENCOUNTER — Other Ambulatory Visit: Payer: Self-pay | Admitting: General Surgery

## 2021-08-30 DIAGNOSIS — R928 Other abnormal and inconclusive findings on diagnostic imaging of breast: Secondary | ICD-10-CM

## 2021-08-30 DIAGNOSIS — N6489 Other specified disorders of breast: Secondary | ICD-10-CM | POA: Diagnosis not present

## 2021-08-30 IMAGING — MG MM DIGITAL DIAGNOSTIC UNILAT*L* W/ TOMO W/ CAD
6 series · 6 of 18 positions shown · non-contrast
Comparison: Previous exam(s).

CLINICAL DATA: Palpable lump in the left breast. Previous right
mastectomy. Non mass enhancement identified in the lower outer left
breast on recent MRI.

EXAM:
DIGITAL DIAGNOSTIC UNILATERAL LEFT MAMMOGRAM WITH TOMOSYNTHESIS AND
CAD; ULTRASOUND LEFT BREAST LIMITED
TECHNIQUE: Left digital diagnostic mammography and breast tomosynthesis was
performed. The images were evaluated with computer-aided detection.;
Targeted ultrasound examination of the left breast was performed.

[L CC synth-2D (1 of 2)]
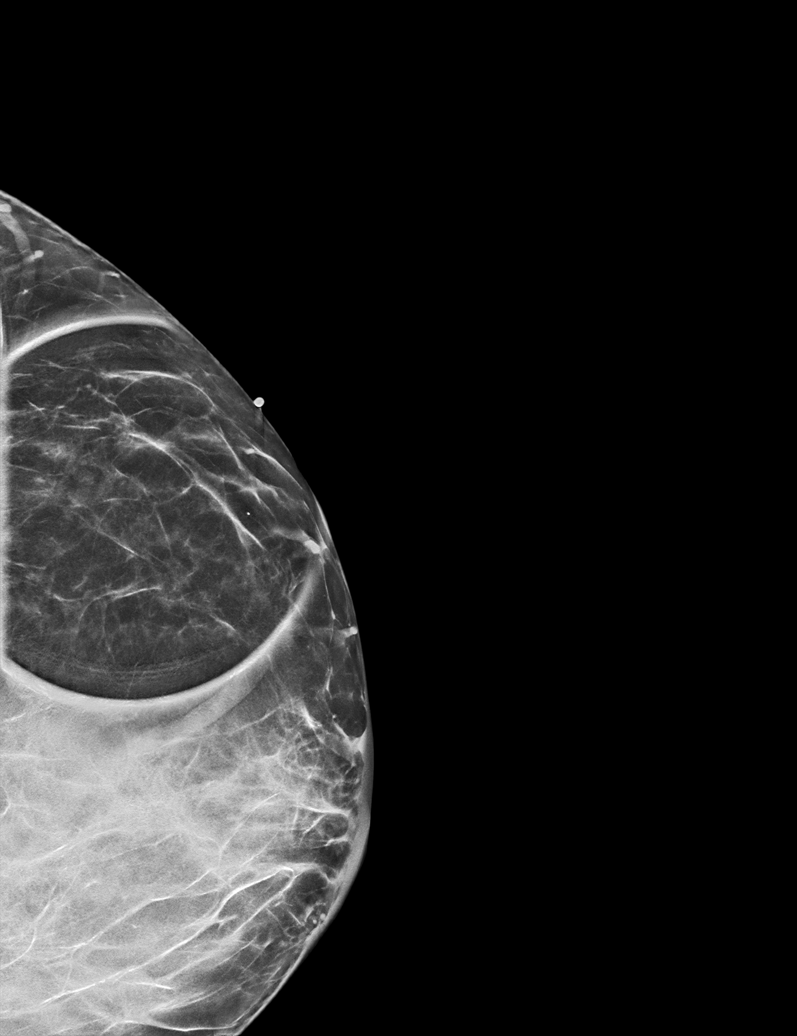

[L MLO synth-2D]
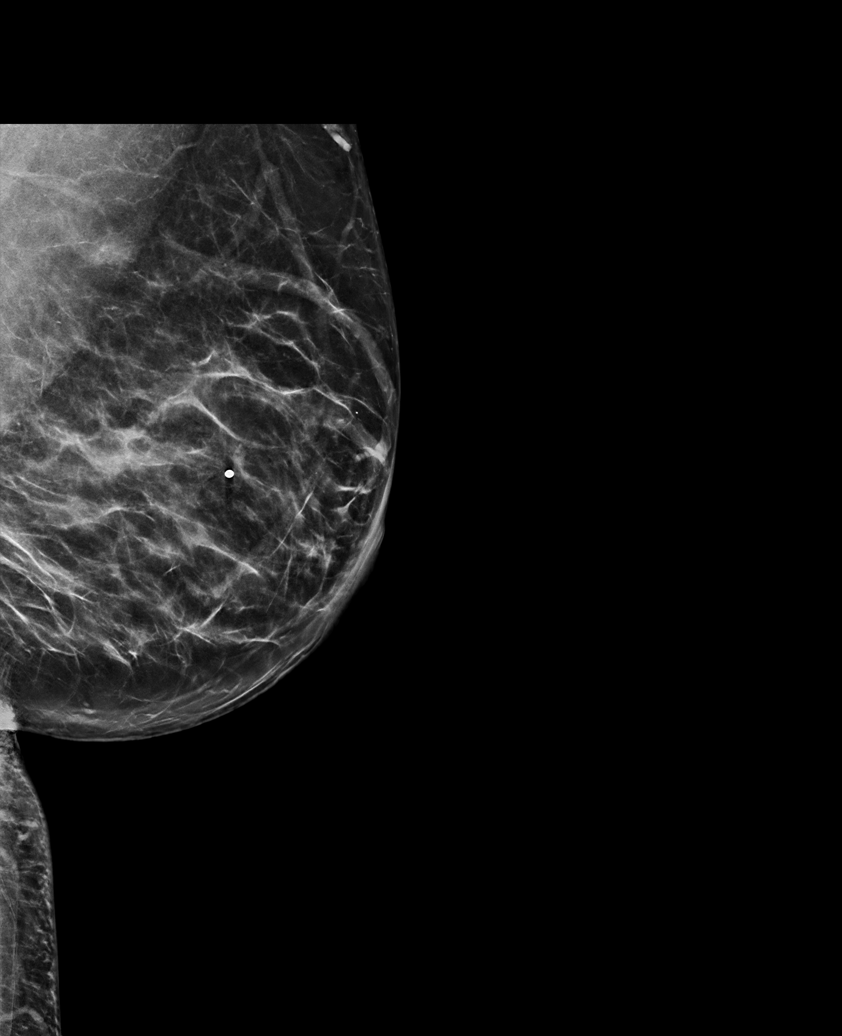

[L CC synth-2D (2 of 2)]
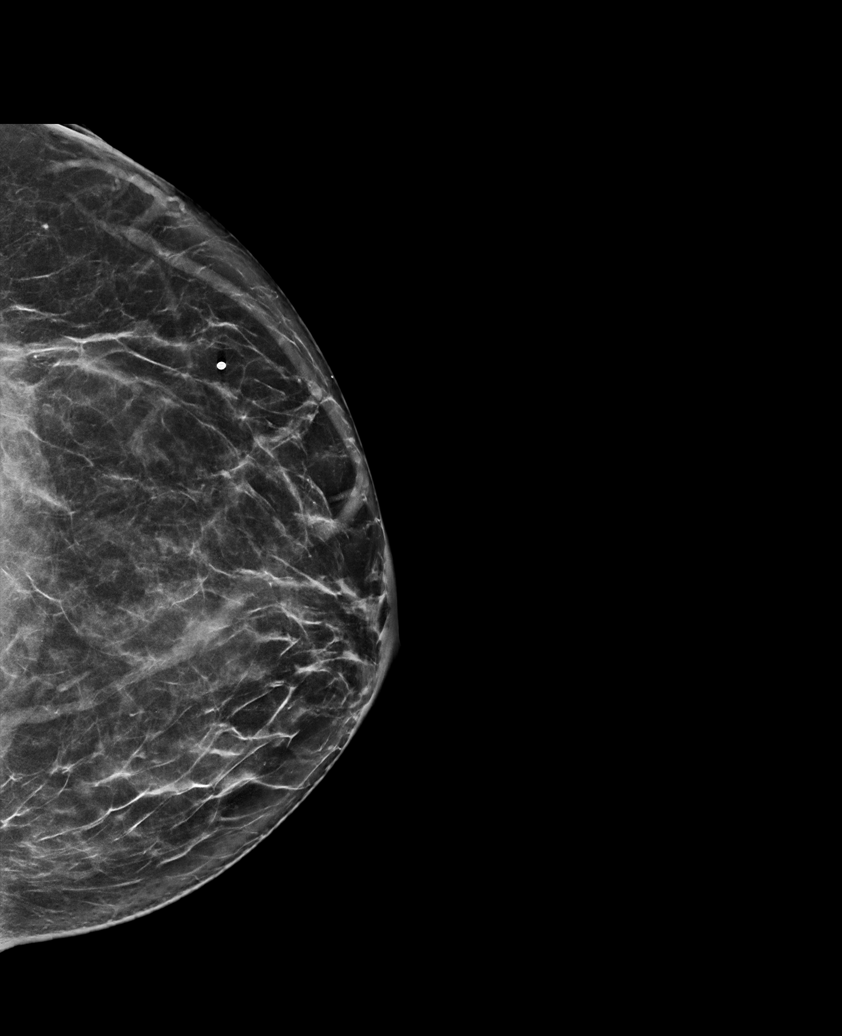

[L CC tomo (1 of 2) · tomo slice 25/50.0]
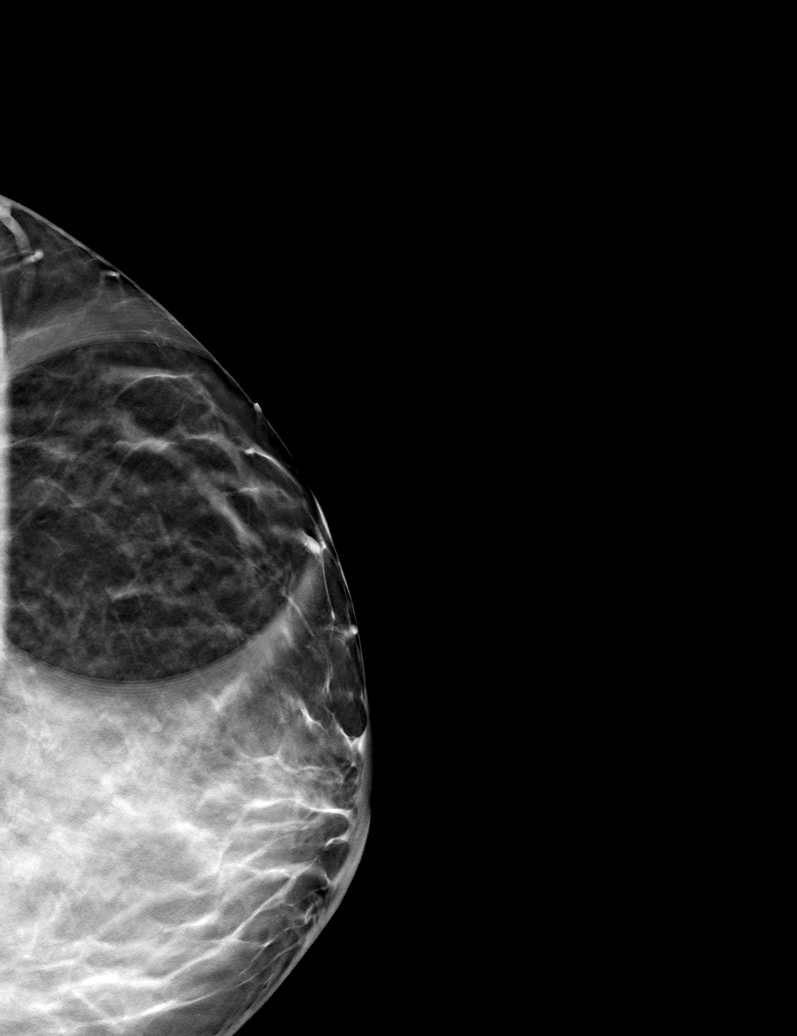

[L MLO tomo · tomo slice 39/77.0]
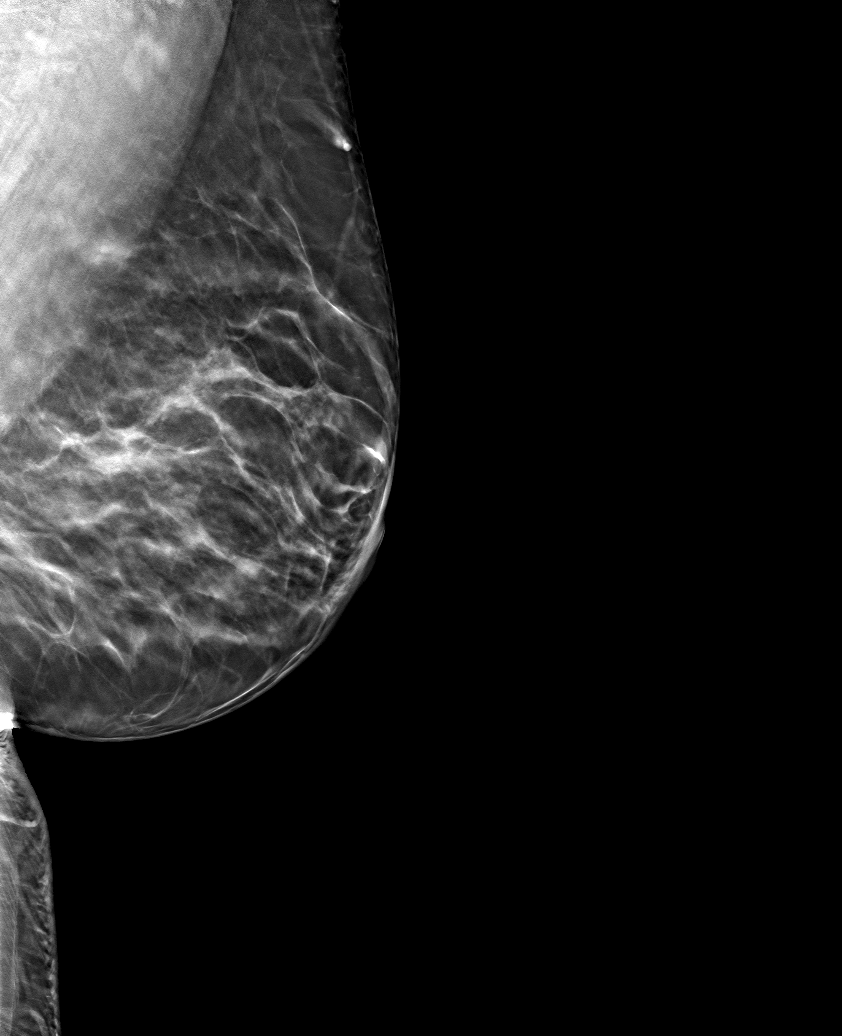

[L CC tomo (2 of 2) · tomo slice 37/72.0]
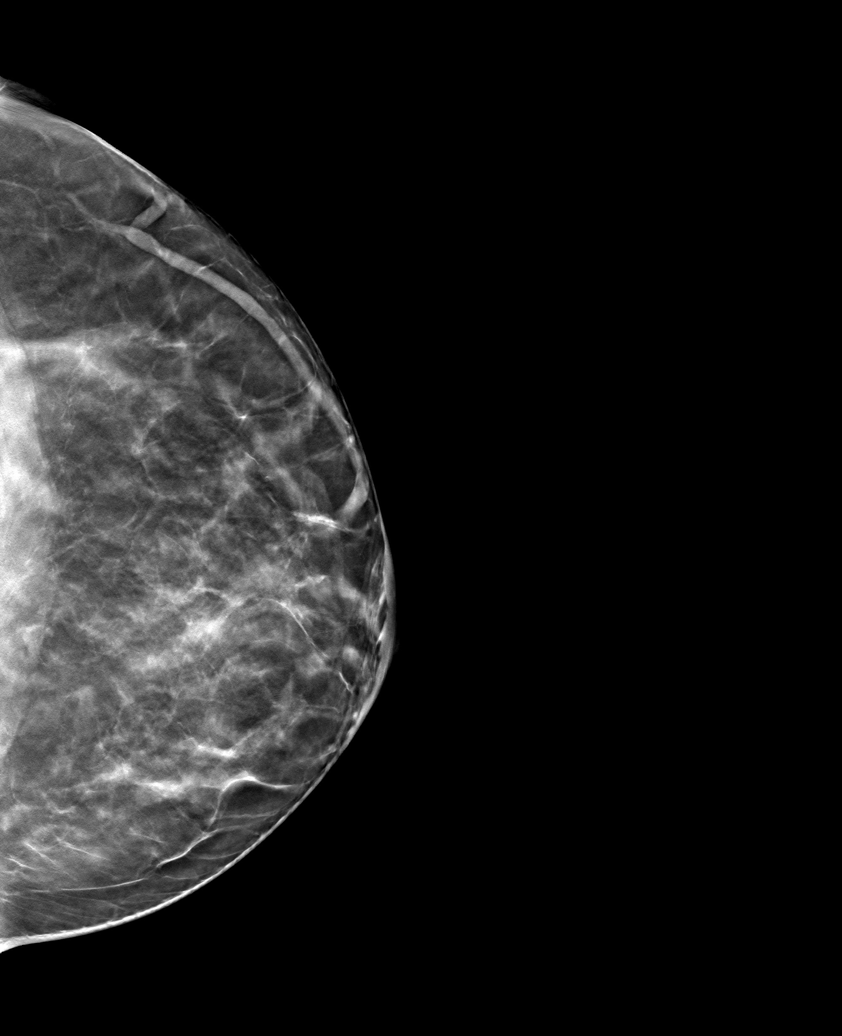

[6 of 18 positions shown; findings below may reference images not displayed]

ACR Breast Density Category b: There are scattered areas of
fibroglandular density.
FINDINGS: No suspicious masses, calcifications, or distortion are identified
on today's study. No abnormalities are identified in the lower outer
left breast to correlate with MRI findings.

Targeted ultrasound is performed, showing no suspicious findings in
the region of the patient's palpable lump.
IMPRESSION: No mammographic or sonographic evidence of malignancy. No correlate
for the recent MRI finding. No correlate for the palpable lump.

RECOMMENDATION:
Treatment of the patient's palpable lump should be based on clinical
and physical exam given lack of imaging findings.

The patient is scheduled for an MRI guided biopsy next week.

Annual screening mammography.

I have discussed the findings and recommendations with the patient.
If applicable, a reminder letter will be sent to the patient
regarding the next appointment.

BI-RADS CATEGORY  1: Negative.

## 2021-08-30 IMAGING — US US BREAST*L* LIMITED INC AXILLA
1 series · 2 of 2 positions shown · non-contrast
Comparison: Previous exam(s).

CLINICAL DATA: Palpable lump in the left breast. Previous right
mastectomy. Non mass enhancement identified in the lower outer left
breast on recent MRI.

EXAM:
DIGITAL DIAGNOSTIC UNILATERAL LEFT MAMMOGRAM WITH TOMOSYNTHESIS AND
CAD; ULTRASOUND LEFT BREAST LIMITED
TECHNIQUE: Left digital diagnostic mammography and breast tomosynthesis was
performed. The images were evaluated with computer-aided detection.;
Targeted ultrasound examination of the left breast was performed.

[Series 1: us breast*left* limited inc axilla · 0.07mm/px · 2 of 2 slices shown]
[im 1/2]
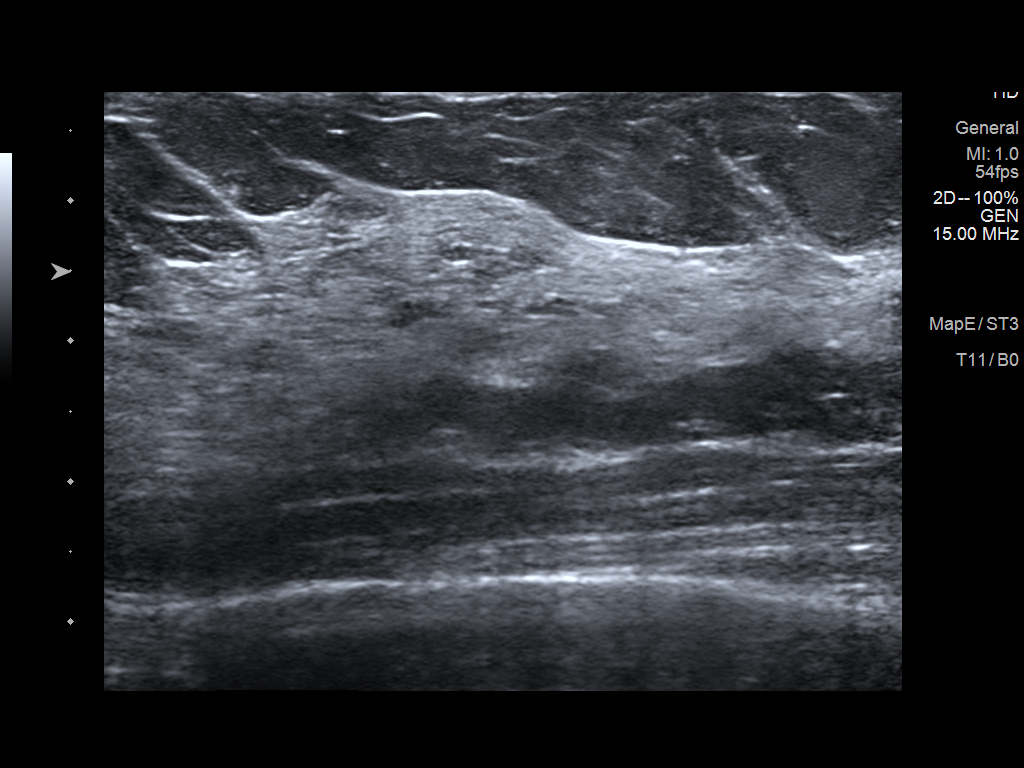
[im 2/2]
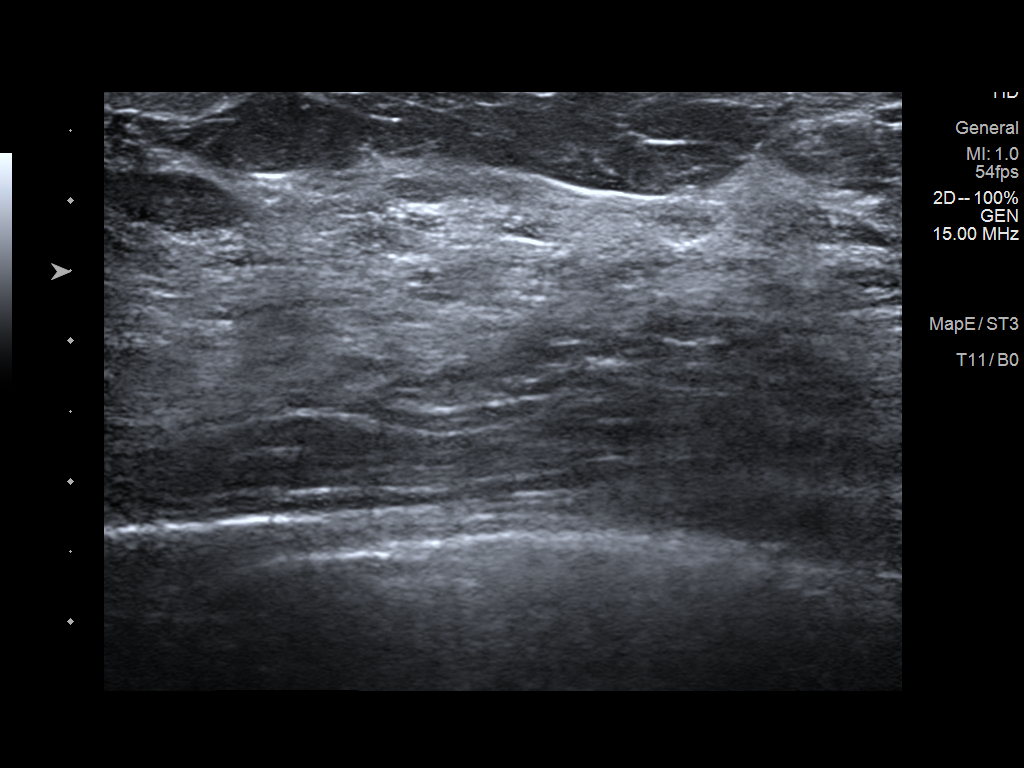

[2 of 2 positions shown; findings below may reference images not displayed]

ACR Breast Density Category b: There are scattered areas of
fibroglandular density.
FINDINGS: No suspicious masses, calcifications, or distortion are identified
on today's study. No abnormalities are identified in the lower outer
left breast to correlate with MRI findings.

Targeted ultrasound is performed, showing no suspicious findings in
the region of the patient's palpable lump.
IMPRESSION: No mammographic or sonographic evidence of malignancy. No correlate
for the recent MRI finding. No correlate for the palpable lump.

RECOMMENDATION:
Treatment of the patient's palpable lump should be based on clinical
and physical exam given lack of imaging findings.

The patient is scheduled for an MRI guided biopsy next week.

Annual screening mammography.

I have discussed the findings and recommendations with the patient.
If applicable, a reminder letter will be sent to the patient
regarding the next appointment.

BI-RADS CATEGORY  1: Negative.

## 2021-09-05 ENCOUNTER — Ambulatory Visit
Admission: RE | Admit: 2021-09-05 | Discharge: 2021-09-05 | Disposition: A | Discharge status: Home / Self Care | Payer: BC Managed Care – PPO | Source: Ambulatory Visit | Attending: General Surgery | Admitting: General Surgery

## 2021-09-05 ENCOUNTER — Other Ambulatory Visit (HOSPITAL_COMMUNITY): Payer: Self-pay | Admitting: Diagnostic Radiology

## 2021-09-05 DIAGNOSIS — R928 Other abnormal and inconclusive findings on diagnostic imaging of breast: Secondary | ICD-10-CM | POA: Diagnosis not present

## 2021-09-05 DIAGNOSIS — N641 Fat necrosis of breast: Secondary | ICD-10-CM | POA: Diagnosis not present

## 2021-09-05 IMAGING — MR MR BREAST BX W LOC DEV 1ST LESION IMAGE BX SPEC MR GUIDE*L*
6 of 8 series · 32 of 48 positions shown · IV contrast (10 ml gadavist)
Comparison: Previous exams.
COMPARISON: Previous exams.

Addendum:
CLINICAL DATA: 45-year-old with a personal history of malignant
RIGHT mastectomy and implant reconstruction in [DATE].
Recent MRI demonstrated indeterminate linear non-mass enhancement
spanning 1.5 cm in the LOWER OUTER QUADRANT of the LEFT breast at
posterior depth.

EXAM:
MRI GUIDED CORE NEEDLE BIOPSY OF THE LEFT BREAST
TECHNIQUE: Multiplanar, multisequence MR imaging of the LEFT breast was
performed both before and after administration of intravenous
contrast.
CONTRAST:  10 mL Gadavist IV.

[Series 2: fiducial unilateral · sagittal · 2.0mm · 1.33mm/px · 1 of 52 slices shown]
[im 1/52]
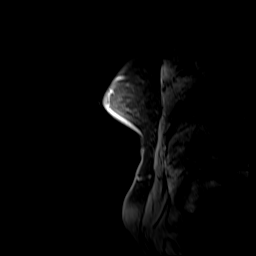

[Series 3: dynamic pre · axial · non-contrast · 1.3mm · 0.73mm/px · z∈[-80,+106]mm · 6 of 144 slices shown]
[im 1/144]
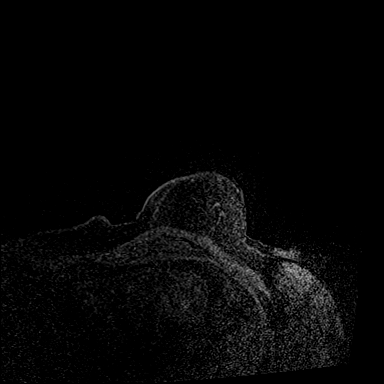
[im 29/144]
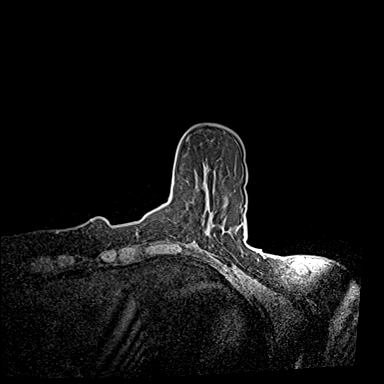
[im 58/144]
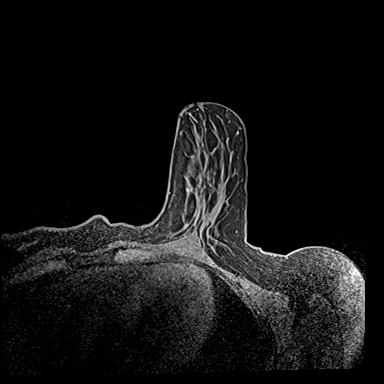
[im 86/144]
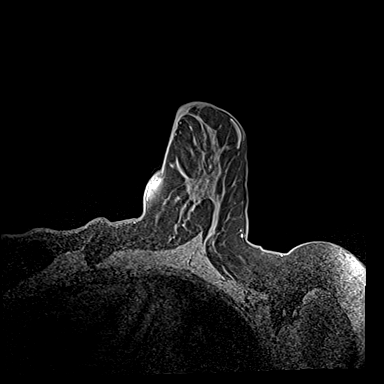
[im 115/144]
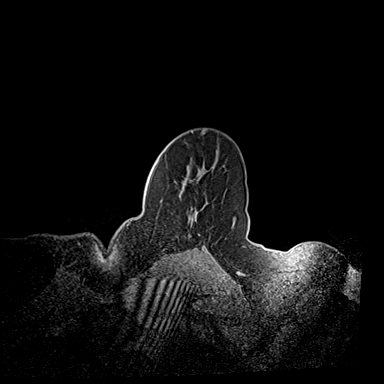
[im 144/144]
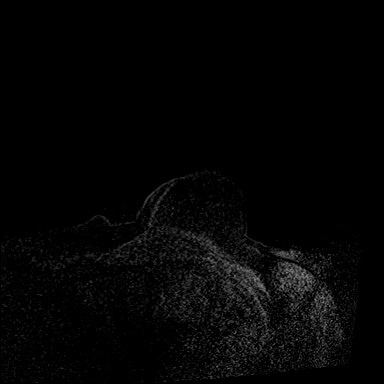

[Series 4: dynamic post 20 · axial · 1.3mm · 0.73mm/px · z∈[-80,+106]mm · 6 of 144 slices shown (1 of 2)]
[im 1/144]
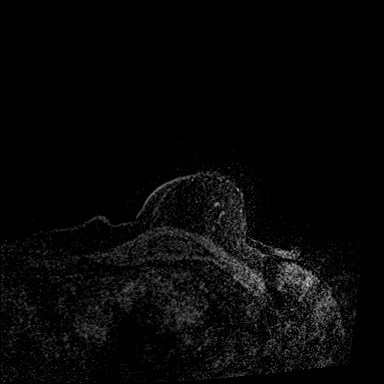
[im 29/144]
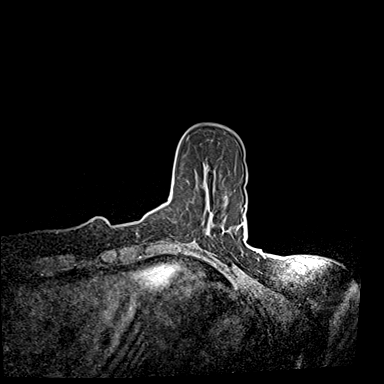
[im 58/144]
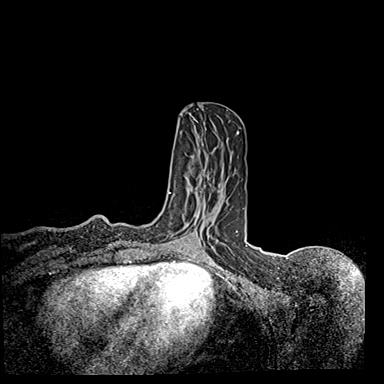
[im 86/144]
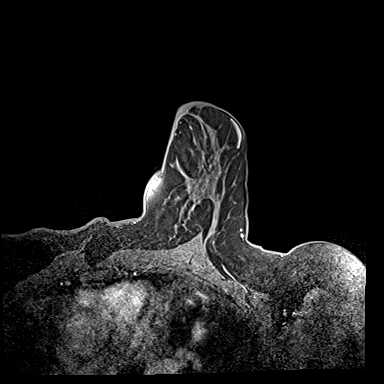
[im 115/144]
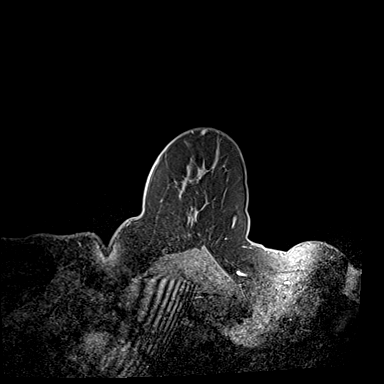
[im 144/144]
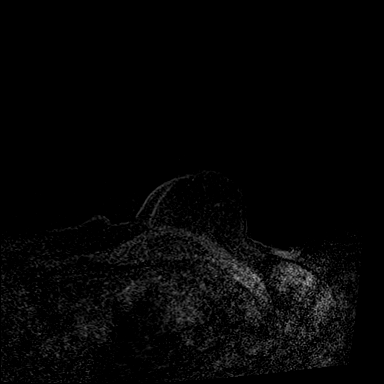

[Series 5: dynamic post 20 · axial · 1.3mm · 0.73mm/px · z∈[-80,+106]mm · 7 of 144 slices shown (2 of 2)]
[im 1/144]
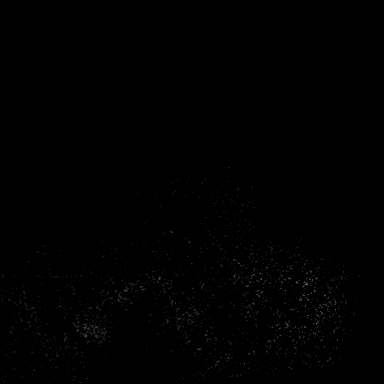
[im 24/144]
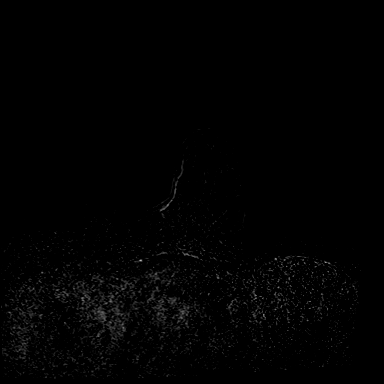
[im 48/144]
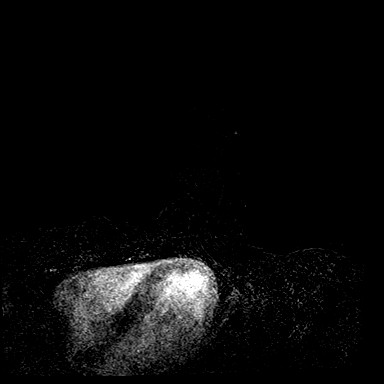
[im 72/144]
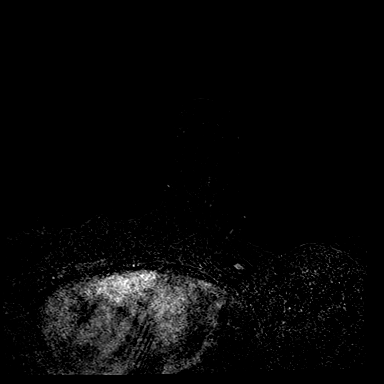
[im 96/144]
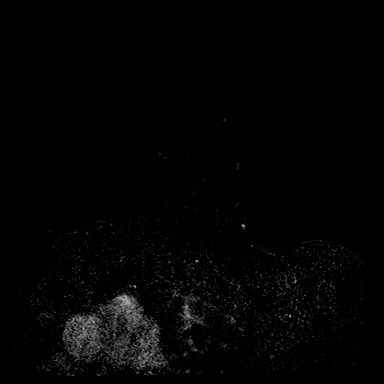
[im 120/144]
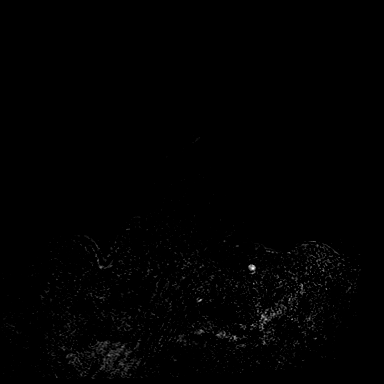
[im 144/144]
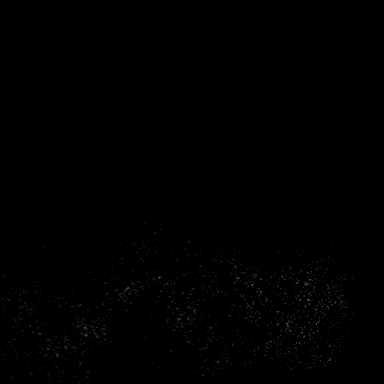

[Series 6: needle confirmation · axial · 1.3mm · 0.73mm/px · z∈[-80,+106]mm · 7 of 144 slices shown]
[im 1/144]
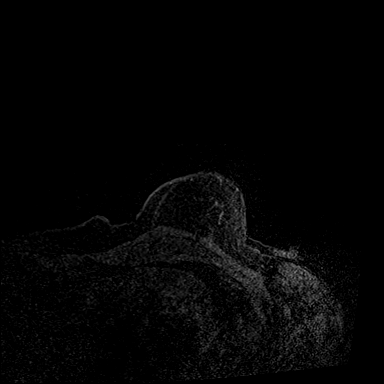
[im 24/144]
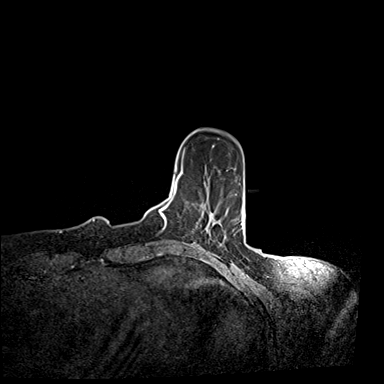
[im 48/144]
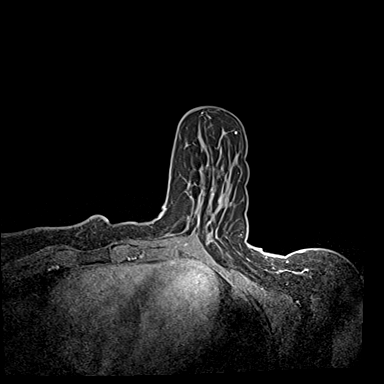
[im 72/144]
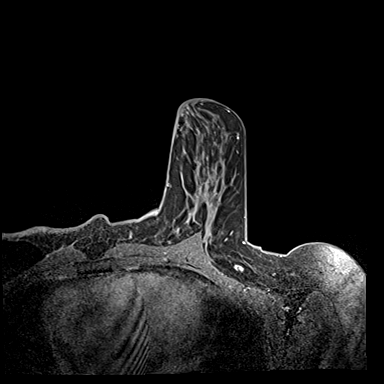
[im 96/144]
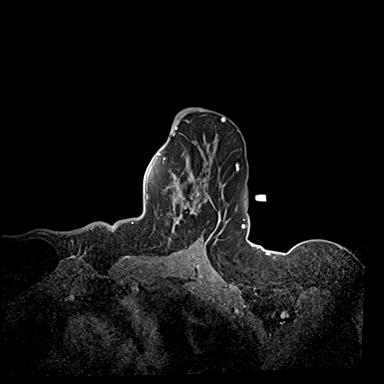
[im 120/144]
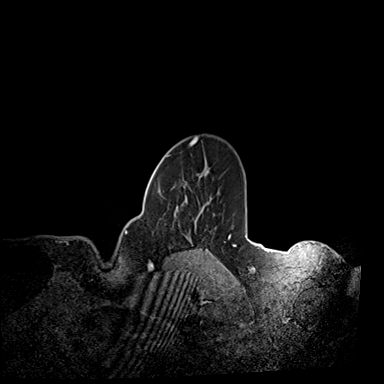
[im 144/144]
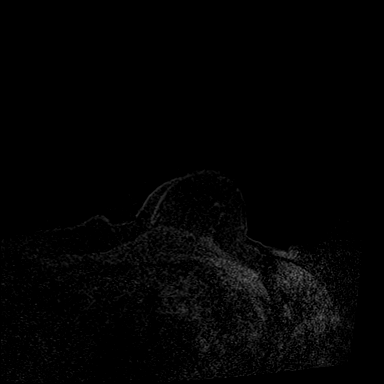

[Series 7: needle confirmation_sub · axial · 1.3mm · 0.73mm/px · z∈[-80,+44]mm · 5 of 144 slices shown]
[im 1/144]
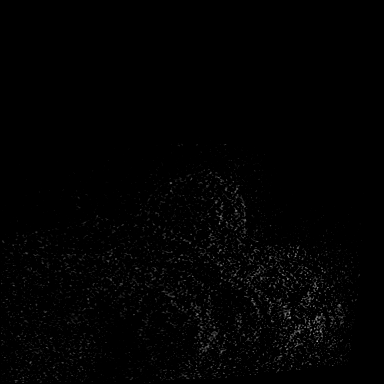
[im 24/144]
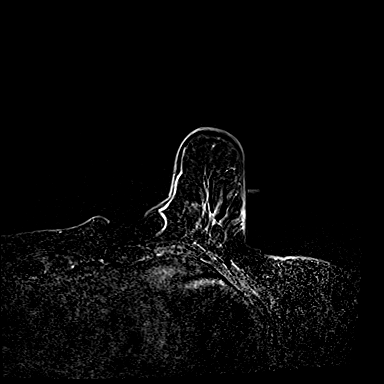
[im 48/144]
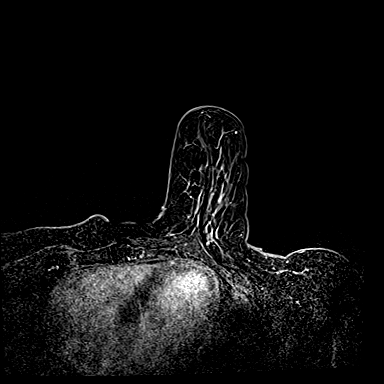
[im 72/144]
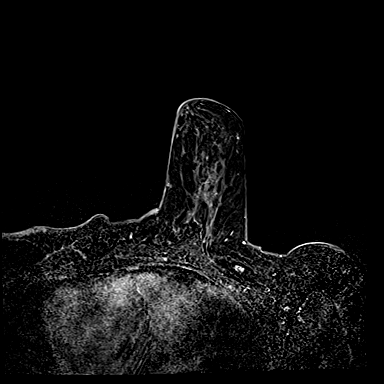
[im 96/144]
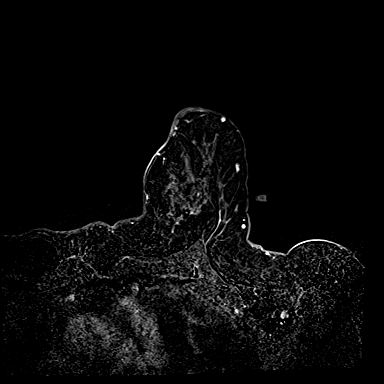

[32 of 48 positions shown; findings below may reference images not displayed]

FINDINGS: I met with the patient, and we discussed the procedure of MRI guided
biopsy, including risks, benefits, and alternatives. Specifically,
we discussed the risks of infection, bleeding, tissue injury, clip
migration, and inadequate sampling. Informed, written consent was
given. The usual time out protocol was performed immediately prior
to the procedure.

Lesion quadrant: LOWER OUTER QUADRANT.

Using sterile technique with chlorhexidine as skin antisepsis, 1%
lidocaine and 1% lidocaine with epinephrine as local anesthetic,
using MRI guidance, a 9 gauge vacuum assisted device was used
perform biopsy of the linear non-mass enhancement in the LOWER OUTER
QUADRANT using a lateral approach. At the conclusion of the
procedure, a an hourglass shaped tissue marker clip was deployed
into the biopsy cavity. Follow-up 2-view mammogram was performed and
dictated separately.
IMPRESSION: MRI guided biopsy of indeterminate non-mass enhancement involving
the LOWER OUTER QUADRANT of the LEFT breast at posterior depth. No
apparent complications.

ADDENDUM:
Pathology revealed FAT NECROSIS WITH GIANT CELL REACTION of the LEFT
breast, lower outer quadrant, (hourglass clip). This was found to be
concordant by Dr. MEGENS.

Pathology results were discussed with the patient by telephone. The
patient reported doing well after the biopsy with tenderness at the
site. Post biopsy instructions and care were reviewed and questions
were answered. The patient was encouraged to call The [REDACTED] for any additional concerns. My direct phone
number was provided.

The patient was instructed to return for annual screening
mammography in 1 year.

Pathology results reported by MEGENS, RN on [DATE].

*** End of Addendum ***
FINDINGS: I met with the patient, and we discussed the procedure of MRI guided
biopsy, including risks, benefits, and alternatives. Specifically,
we discussed the risks of infection, bleeding, tissue injury, clip
migration, and inadequate sampling. Informed, written consent was
given. The usual time out protocol was performed immediately prior
to the procedure.

Lesion quadrant: LOWER OUTER QUADRANT.

Using sterile technique with chlorhexidine as skin antisepsis, 1%
lidocaine and 1% lidocaine with epinephrine as local anesthetic,
using MRI guidance, a 9 gauge vacuum assisted device was used
perform biopsy of the linear non-mass enhancement in the LOWER OUTER
QUADRANT using a lateral approach. At the conclusion of the
procedure, a an hourglass shaped tissue marker clip was deployed
into the biopsy cavity. Follow-up 2-view mammogram was performed and
dictated separately.
IMPRESSION: MRI guided biopsy of indeterminate non-mass enhancement involving
the LOWER OUTER QUADRANT of the LEFT breast at posterior depth. No
apparent complications.

## 2021-09-05 MED ORDER — GADOBUTROL 1 MMOL/ML IV SOLN
10.0000 mL | Freq: Once | INTRAVENOUS | Status: AC | PRN
  Administered 2021-09-05: 10 mL via INTRAVENOUS

## 2021-09-20 DIAGNOSIS — F4321 Adjustment disorder with depressed mood: Secondary | ICD-10-CM | POA: Diagnosis not present

## 2021-10-04 ENCOUNTER — Ambulatory Visit: Payer: BC Managed Care – PPO | Admitting: Plastic Surgery

## 2021-10-24 ENCOUNTER — Ambulatory Visit: Payer: BC Managed Care – PPO | Admitting: Plastic Surgery

## 2021-10-26 ENCOUNTER — Encounter: Payer: Self-pay | Admitting: Genetic Counselor

## 2021-10-26 NOTE — Progress Notes (Signed)
Update:   FH [id number] variant of uncertain significance was reclassified to "likely benign".  SUFU [id number] variant of uncertain significance was reclassified to "benign".

## 2021-10-29 ENCOUNTER — Ambulatory Visit (INDEPENDENT_AMBULATORY_CARE_PROVIDER_SITE_OTHER): Payer: BC Managed Care – PPO | Admitting: Plastic Surgery

## 2021-10-29 DIAGNOSIS — N651 Disproportion of reconstructed breast: Secondary | ICD-10-CM

## 2021-10-29 DIAGNOSIS — C50211 Malignant neoplasm of upper-inner quadrant of right female breast: Secondary | ICD-10-CM

## 2021-10-30 NOTE — Progress Notes (Signed)
      Referring Provider Kristen Loader, [address],  [place] [address]   CC:  Breast reconstruction  Connie Underwood is an 46 y.o. female.  HPI: The patient is status post breast reconstruction on the right with silicone implant.  She is very happy with the result but does note some concavity in the upper pole of her right breast.  She is interested in fat grafting.  Review of Systems General: No fevers or chills or unintentional weight loss  Physical Exam    05/10/2021    4:45 PM 05/10/2021    2:13 PM 05/03/2021   10:26 AM  Vitals with BMI  Systolic [phone] 97  Diastolic 66 78 57  Pulse 79 85 66    General:  No acute distress,  Alert and oriented, Non-Toxic, Normal speech and affect Breast: Excellent breast reconstruction on the right side but does have concavity on the upper pole which is typical.  Excellent symmetry between breast otherwise.  Assessment/Plan Patient has an excellent initial result from breast reconstruction.  I do think she will benefit from fat grafting to the upper pole right breast.  We discussed taking her fat from her lower abdomen and that this may take multiple rounds for ideal results.  She agrees with the plan. [name] 10/30/2021, 8:56 AM

## 2021-11-12 ENCOUNTER — Telehealth: Payer: Self-pay | Admitting: Plastic Surgery

## 2021-11-13 ENCOUNTER — Telehealth: Payer: Self-pay | Admitting: *Deleted

## 2021-11-13 NOTE — Telephone Encounter (Signed)
Auth submitted to [facility] via [place] for CPT [id number], [id number], Sophia: [id number]

## 2021-11-14 ENCOUNTER — Encounter: Payer: Self-pay | Admitting: Hematology

## 2021-11-20 ENCOUNTER — Telehealth: Payer: Self-pay | Admitting: *Deleted

## 2021-11-20 NOTE — Telephone Encounter (Signed)
Received call from [facility] stating that case ID [id number] is being cancelled because no Connie Underwood is required for the three CPT codes submitted.

## 2021-11-30 ENCOUNTER — Encounter: Payer: Self-pay | Admitting: Physician Assistant

## 2021-11-30 ENCOUNTER — Ambulatory Visit (INDEPENDENT_AMBULATORY_CARE_PROVIDER_SITE_OTHER): Payer: BC Managed Care – PPO | Admitting: Physician Assistant

## 2021-11-30 VITALS — BP 127/75 | HR 72 | Ht 64.5 in | Wt 236.0 lb

## 2021-11-30 DIAGNOSIS — C50211 Malignant neoplasm of upper-inner quadrant of right female breast: Secondary | ICD-10-CM

## 2021-11-30 DIAGNOSIS — Z170 Estrogen receptor positive status [ER+]: Secondary | ICD-10-CM

## 2021-11-30 MED ORDER — OXYCODONE-ACETAMINOPHEN 5-325 MG PO TABS: 1.0000 | ORAL_TABLET | Freq: Four times a day (QID) | ORAL | 0 refills | Status: DC | PRN

## 2021-11-30 NOTE — Progress Notes (Signed)
Patient ID: Connie Underwood, female    DOB: October 05, 1975, 46 y.o.   MRN: [id number]  Chief Complaint  Patient presents with   Pre-op Exam    No diagnosis found.   History of Present Illness: Connie Underwood is a 46 y.o.  female  with a history of right-sided breast cancer status post reconstruction with implant exchange and left-sided mastopexy performed on 03/12/2021 by Dr. Claudia Desanctis.  She presents for preoperative evaluation for upcoming procedure, fat grafting to the upper pole right breast, scheduled for 12/11/2021 with Dr. Erin Hearing.  The patient has not had problems with anesthesia.   Summary of Previous Visit: Patient was most recently seen in the office on 10/29/2021 by Dr. Erin Hearing.  She was happy with her surgical outcome but did note some flatness in the upper pole of the right breast.  At that time they discussed taking in her lower abdomen and placing it into the breast, they discussed this may take multiple rounds for ideal results.  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  She was on tamoxifen, she discussed with oncology she has stopped the medication and will restart after surgery.   Past Medical History: Allergies: Allergies  Allergen Reactions   Tomato     Hives   Pollen Extract Other (See Comments)    Seasonal allergies     Current Medications:  Current Outpatient Medications:    EMGALITY 120 MG/ML SOAJ, Inject 1 mL into the skin every 30 (thirty) days., Disp: , Rfl:    SUMAtriptan (IMITREX) 50 MG tablet, Take 50 mg by mouth every 2 (two) hours as needed for migraine., Disp: , Rfl:    tamoxifen (NOLVADEX) 20 MG tablet, TAKE 1 TABLET BY MOUTH EVERY DAY, Disp: 90 tablet, Rfl: 1   Vitamin D, Ergocalciferol, (DRISDOL) 1.25 MG (50000 UNIT) CAPS capsule, Take 50,000 Units by mouth once a week., Disp: , Rfl:   Past Medical Problems: Past Medical History:  Diagnosis  Date   Anemia    Anxiety    Breast cancer ([place])    right   Depression    Family history of prostate cancer 10/18/2020   Headache    migraines   Hx of migraines    Pre-diabetes    Seasonal allergic reaction     Past Surgical History: Past Surgical History:  Procedure Laterality Date   BREAST RECONSTRUCTION WITH PLACEMENT OF TISSUE EXPANDER AND FLEX HD (ACELLULAR HYDRATED DERMIS) Right 12/25/2020   Procedure: RIGHT BREAST RECONSTRUCTION WITH PLACEMENT OF TISSUE EXPANDER AND FLEX HD (ACELLULAR HYDRATED DERMIS);  Surgeon: Cindra Presume, MD;  Location: [place];  Service: Plastics;  Laterality: Right;   MASTECTOMY W/ SENTINEL NODE BIOPSY Right 12/25/2020   Procedure: RIGHT MASTECTOMY WITH RIGHT AXILLARY SENTINEL LYMPH NODE BIOPSY;  Surgeon: Rolm Bookbinder, MD;  Location: [place];  Service: General;  Laterality: Right;   MASTOPEXY Left 03/12/2021   Procedure: LEFT MASTOPEXY;  Surgeon: Cindra Presume, MD;  Location: [place];  Service: Plastics;  Laterality: Left;   REMOVAL OF BILATERAL TISSUE EXPANDERS WITH PLACEMENT OF BILATERAL BREAST IMPLANTS Right 03/12/2021   Procedure: REMOVAL OF RIGHT TISSUE EXPANDERS WITH PLACEMENT OF RIGHT  BREAST IMPLANTS;  Surgeon: Cindra Presume, MD;  Location: [place];  Service: Plastics;  Laterality: Right;   TUBAL LIGATION      Social History: Social History   Socioeconomic History   Marital  status: Single    Spouse name: Not on file   Number of children: 4   Years of education: Not on file   Highest education level: Not on file  Occupational History   Not on file  Tobacco Use   Smoking status: Never   Smokeless tobacco: Never  Vaping Use   Vaping Use: Never used  Substance and Sexual Activity   Alcohol use: No   Drug use: No   Sexual activity: Not on file  Other Topics Concern   Not on file  Social History Narrative   Not on file   Social Determinants of Health   Financial Resource Strain: High Risk (12/21/2020)   Overall Financial  Resource Strain (CARDIA)    Difficulty of Paying Living Expenses: Very hard  Food Insecurity: Food Insecurity Present (12/21/2020)   Hunger Vital Sign    Worried About Running Out of Food in the Last Year: Often true    Echo in the Last Year: Often true  Transportation Needs: Unmet Transportation Needs (12/21/2020)   PRAPARE - Hydrologist (Medical): Yes    Lack of Transportation (Non-Medical): Yes  Physical Activity: Not on file  Stress: Stress Concern Present (12/21/2020)   [place]    Feeling of Stress : Very much  Social Connections: Not on file  Intimate Partner Violence: Not on file    Family History: Family History  Problem Relation Age of Onset   Prostate cancer Maternal Uncle        dx after 54   Cancer Maternal Uncle        unknown type dx after 50; prostate? lung?   Prostate cancer Paternal Uncle        3 paternal uncles with prostate cancer dx after 40   Prostate cancer Paternal Grandfather        dx after 27    Review of Systems: ROS  Physical Exam: Vital Signs BP 127/75 (BP Location: Left Arm, Patient Position: Sitting, Cuff Size: Large)   Pulse 72   Ht 5' 4.5" (1.638 m)   Wt 236 lb (107 kg)   SpO2 100%   BMI 39.88 kg/m   Physical Exam  Constitutional:      General: Not in acute distress.    Appearance: Normal appearance. Not ill-appearing.  HENT:     Head: Normocephalic and atraumatic.  Eyes:     Pupils: Pupils are equal, round. Cardiovascular:     Rate and Rhythm: Normal rate.    Pulses: Normal pulses.  Pulmonary:     Effort: No respiratory distress or increased work of breathing.  Speaks in full sentences.   Musculoskeletal: Normal range of motion. No lower extremity swelling or edema. No varicosities.  Skin:    General: Skin is warm and dry.     Findings: No erythema or rash.  Neurological:     Mental Status: Alert and oriented to  person, place, and time.  Psychiatric:        Mood and Affect: Mood normal.        Behavior: Behavior normal.    Assessment/Plan: The patient is scheduled for fat grafting to the upper pole right breast with Dr. Erin Hearing.  Risks, benefits, and alternatives of procedure discussed, questions answered and consent obtained.    Smoking Status: Non-smoker;  Last Mammogram: June 2023;  Caprini Score: 6; Risk Factors include: Age, length of surgery, BMI, previous history of  cancer.  Encourage early ambulation.   Pictures obtained: Previous visit  Post-op Rx sent to pharmacy: Percocet   Patient was provided with the  General Surgical Risk consent document and Pain Medication Agreement prior to their appointment.  They had adequate time to read through the risk consent documents and Pain Medication Agreement. We also discussed them in person together during this preop appointment. All of their questions were answered to their satisfaction.  Recommended calling if they have any further questions.  Risk consent form and Pain Medication Agreement to be scanned into patient's chart.     Electronically signed by: Stevie Kern Shaunn Tackitt, PA-C 11/30/2021 2:43 PM

## 2021-11-30 NOTE — H&P (View-Only) (Signed)
Patient ID: Connie Underwood, female    DOB: 12-02-1975, 46 y.o.   MRN: [id number]  Chief Complaint  Patient presents with   Pre-op Exam    No diagnosis found.   History of Present Illness: Connie Underwood is a 46 y.o.  female  with a history of right-sided breast cancer status post reconstruction with implant exchange and left-sided mastopexy performed on 03/12/2021 by Dr. Claudia Desanctis.  She presents for preoperative evaluation for upcoming procedure, fat grafting to the upper pole right breast, scheduled for 12/11/2021 with Dr. Erin Hearing.  The patient has not had problems with anesthesia.   Summary of Previous Visit: Patient was most recently seen in the office on 10/29/2021 by Dr. Erin Hearing.  She was happy with her surgical outcome but did note some flatness in the upper pole of the right breast.  At that time they discussed taking in her lower abdomen and placing it into the breast, they discussed this may take multiple rounds for ideal results.  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  She was on tamoxifen, she discussed with oncology she has stopped the medication and will restart after surgery.   Past Medical History: Allergies: Allergies  Allergen Reactions   Tomato     Hives   Pollen Extract Other (See Comments)    Seasonal allergies     Current Medications:  Current Outpatient Medications:    EMGALITY 120 MG/ML SOAJ, Inject 1 mL into the skin every 30 (thirty) days., Disp: , Rfl:    SUMAtriptan (IMITREX) 50 MG tablet, Take 50 mg by mouth every 2 (two) hours as needed for migraine., Disp: , Rfl:    tamoxifen (NOLVADEX) 20 MG tablet, TAKE 1 TABLET BY MOUTH EVERY DAY, Disp: 90 tablet, Rfl: 1   Vitamin D, Ergocalciferol, (DRISDOL) 1.25 MG (50000 UNIT) CAPS capsule, Take 50,000 Units by mouth once a week., Disp: , Rfl:   Past Medical Problems: Past Medical History:  Diagnosis  Date   Anemia    Anxiety    Breast cancer ([place])    right   Depression    Family history of prostate cancer 10/18/2020   Headache    migraines   Hx of migraines    Pre-diabetes    Seasonal allergic reaction     Past Surgical History: Past Surgical History:  Procedure Laterality Date   BREAST RECONSTRUCTION WITH PLACEMENT OF TISSUE EXPANDER AND FLEX HD (ACELLULAR HYDRATED DERMIS) Right 12/25/2020   Procedure: RIGHT BREAST RECONSTRUCTION WITH PLACEMENT OF TISSUE EXPANDER AND FLEX HD (ACELLULAR HYDRATED DERMIS);  Surgeon: Cindra Presume, MD;  Location: [facility];  Service: Plastics;  Laterality: Right;   MASTECTOMY W/ SENTINEL NODE BIOPSY Right 12/25/2020   Procedure: RIGHT MASTECTOMY WITH RIGHT AXILLARY SENTINEL LYMPH NODE BIOPSY;  Surgeon: Rolm Bookbinder, MD;  Location: [facility];  Service: General;  Laterality: Right;   MASTOPEXY Left 03/12/2021   Procedure: LEFT MASTOPEXY;  Surgeon: Cindra Presume, MD;  Location: [facility];  Service: Plastics;  Laterality: Left;   REMOVAL OF BILATERAL TISSUE EXPANDERS WITH PLACEMENT OF BILATERAL BREAST IMPLANTS Right 03/12/2021   Procedure: REMOVAL OF RIGHT TISSUE EXPANDERS WITH PLACEMENT OF RIGHT  BREAST IMPLANTS;  Surgeon: Cindra Presume, MD;  Location: [facility];  Service: Plastics;  Laterality: Right;   TUBAL LIGATION      Social History: Social History   Socioeconomic History   Marital  status: Single    Spouse name: Not on file   Number of children: 4   Years of education: Not on file   Highest education level: Not on file  Occupational History   Not on file  Tobacco Use   Smoking status: Never   Smokeless tobacco: Never  Vaping Use   Vaping Use: Never used  Substance and Sexual Activity   Alcohol use: No   Drug use: No   Sexual activity: Not on file  Other Topics Concern   Not on file  Social History Narrative   Not on file   Social Determinants of Health   Financial Resource Strain: High Risk (12/21/2020)   Overall Financial  Resource Strain (CARDIA)    Difficulty of Paying Living Expenses: Very hard  Food Insecurity: Food Insecurity Present (12/21/2020)   Hunger Vital Sign    Worried About Running Out of Food in the Last Year: Often true    [place] in the Last Year: Often true  Transportation Needs: Unmet Transportation Needs (12/21/2020)   PRAPARE - Hydrologist (Medical): Yes    Lack of Transportation (Non-Medical): Yes  Physical Activity: Not on file  Stress: Stress Concern Present (12/21/2020)   [place]    Feeling of Stress : Very much  Social Connections: Not on file  Intimate Partner Violence: Not on file    Family History: Family History  Problem Relation Age of Onset   Prostate cancer Maternal Uncle        dx after 27   Cancer Maternal Uncle        unknown type dx after 50; prostate? lung?   Prostate cancer Paternal Uncle        3 paternal uncles with prostate cancer dx after 31   Prostate cancer Paternal Grandfather        dx after 33    Review of Systems: ROS  Physical Exam: Vital Signs BP 127/75 (BP Location: Left Arm, Patient Position: Sitting, Cuff Size: Large)   Pulse 72   Ht 5' 4.5" (1.638 m)   Wt 236 lb (107 kg)   SpO2 100%   BMI 39.88 kg/m   Physical Exam  Constitutional:      General: Not in acute distress.    Appearance: Normal appearance. Not ill-appearing.  HENT:     Head: Normocephalic and atraumatic.  Eyes:     Pupils: Pupils are equal, round. Cardiovascular:     Rate and Rhythm: Normal rate.    Pulses: Normal pulses.  Pulmonary:     Effort: No respiratory distress or increased work of breathing.  Speaks in full sentences.   Musculoskeletal: Normal range of motion. No lower extremity swelling or edema. No varicosities.  Skin:    General: Skin is warm and dry.     Findings: No erythema or rash.  Neurological:     Mental Status: Alert and oriented to  person, place, and time.  Psychiatric:        Mood and Affect: Mood normal.        Behavior: Behavior normal.    Assessment/Plan: The patient is scheduled for fat grafting to the upper pole right breast with Dr. Erin Hearing.  Risks, benefits, and alternatives of procedure discussed, questions answered and consent obtained.    Smoking Status: Non-smoker;  Last Mammogram: June 2023;  Caprini Score: 6; Risk Factors include: Age, length of surgery, BMI, previous history of  cancer.  Encourage early ambulation.   Pictures obtained: Previous visit  Post-op Rx sent to pharmacy: Percocet   Patient was provided with the  General Surgical Risk consent document and Pain Medication Agreement prior to their appointment.  They had adequate time to read through the risk consent documents and Pain Medication Agreement. We also discussed them in person together during this preop appointment. All of their questions were answered to their satisfaction.  Recommended calling if they have any further questions.  Risk consent form and Pain Medication Agreement to be scanned into patient's chart.     Electronically signed by: Stevie Kern Jamorris Ndiaye, PA-C 11/30/2021 2:43 PM

## 2021-12-04 ENCOUNTER — Encounter (HOSPITAL_BASED_OUTPATIENT_CLINIC_OR_DEPARTMENT_OTHER): Payer: Self-pay | Admitting: Plastic Surgery

## 2021-12-04 ENCOUNTER — Other Ambulatory Visit: Payer: Self-pay

## 2021-12-11 ENCOUNTER — Ambulatory Visit (HOSPITAL_BASED_OUTPATIENT_CLINIC_OR_DEPARTMENT_OTHER)
Admission: RE | Admit: 2021-12-11 | Discharge: 2021-12-11 | Disposition: A | Discharge status: Home / Self Care | Payer: BC Managed Care – PPO | Attending: Plastic Surgery | Admitting: Plastic Surgery

## 2021-12-11 ENCOUNTER — Other Ambulatory Visit: Payer: Self-pay

## 2021-12-11 ENCOUNTER — Encounter (HOSPITAL_BASED_OUTPATIENT_CLINIC_OR_DEPARTMENT_OTHER): Payer: Self-pay | Admitting: Plastic Surgery

## 2021-12-11 ENCOUNTER — Ambulatory Visit (HOSPITAL_BASED_OUTPATIENT_CLINIC_OR_DEPARTMENT_OTHER): Payer: BC Managed Care – PPO | Admitting: Anesthesiology

## 2021-12-11 ENCOUNTER — Encounter (HOSPITAL_BASED_OUTPATIENT_CLINIC_OR_DEPARTMENT_OTHER)
Admission: RE | Disposition: A | Discharge status: Home / Self Care | Payer: Self-pay | Source: Home / Self Care | Attending: Plastic Surgery

## 2021-12-11 DIAGNOSIS — Z9011 Acquired absence of right breast and nipple: Secondary | ICD-10-CM | POA: Insufficient documentation

## 2021-12-11 DIAGNOSIS — Z170 Estrogen receptor positive status [ER+]: Secondary | ICD-10-CM | POA: Diagnosis not present

## 2021-12-11 DIAGNOSIS — C50911 Malignant neoplasm of unspecified site of right female breast: Secondary | ICD-10-CM | POA: Insufficient documentation

## 2021-12-11 DIAGNOSIS — Z79810 Long term (current) use of selective estrogen receptor modulators (SERMs): Secondary | ICD-10-CM | POA: Insufficient documentation

## 2021-12-11 DIAGNOSIS — C50211 Malignant neoplasm of upper-inner quadrant of right female breast: Secondary | ICD-10-CM | POA: Diagnosis not present

## 2021-12-11 DIAGNOSIS — Z6839 Body mass index (BMI) 39.0-39.9, adult: Secondary | ICD-10-CM | POA: Insufficient documentation

## 2021-12-11 DIAGNOSIS — Z01818 Encounter for other preprocedural examination: Secondary | ICD-10-CM

## 2021-12-11 DIAGNOSIS — Z421 Encounter for breast reconstruction following mastectomy: Secondary | ICD-10-CM | POA: Diagnosis not present

## 2021-12-11 HISTORY — PX: LIPOSUCTION WITH LIPOFILLING: SHX6436

## 2021-12-11 LAB — POCT PREGNANCY, URINE: Preg Test, Ur: NEGATIVE

## 2021-12-11 SURGERY — LIPOSUCTION, WITH FAT TRANSFER
Anesthesia: General | Site: Breast | Laterality: Right

## 2021-12-11 MED ORDER — CEFAZOLIN SODIUM-DEXTROSE 2-4 GM/100ML-% IV SOLN
2.0000 g | INTRAVENOUS | Status: AC
  Administered 2021-12-11: 2 g via INTRAVENOUS

## 2021-12-11 MED ORDER — KETAMINE HCL 50 MG/5ML IJ SOSY
PREFILLED_SYRINGE | INTRAMUSCULAR | Status: AC
  Filled 2021-12-11: qty 5

## 2021-12-11 MED ORDER — DEXAMETHASONE SODIUM PHOSPHATE 4 MG/ML IJ SOLN
INTRAMUSCULAR | Status: DC | PRN
  Administered 2021-12-11: 5 mg via INTRAVENOUS

## 2021-12-11 MED ORDER — ROCURONIUM BROMIDE 100 MG/10ML IV SOLN
INTRAVENOUS | Status: DC | PRN
  Administered 2021-12-11: 50 mg via INTRAVENOUS

## 2021-12-11 MED ORDER — OXYCODONE HCL 5 MG PO TABS: 5.0000 mg | ORAL_TABLET | Freq: Once | ORAL | Status: DC | PRN

## 2021-12-11 MED ORDER — OXYCODONE HCL 5 MG/5ML PO SOLN: 5.0000 mg | Freq: Once | ORAL | Status: DC | PRN

## 2021-12-11 MED ORDER — DEXMEDETOMIDINE HCL IN NACL 200 MCG/50ML IV SOLN
INTRAVENOUS | Status: DC | PRN
  Administered 2021-12-11: 12 ug via INTRAVENOUS

## 2021-12-11 MED ORDER — BUPIVACAINE-EPINEPHRINE (PF) 0.25% -1:200000 IJ SOLN
INTRAMUSCULAR | Status: AC
  Filled 2021-12-11: qty 30

## 2021-12-11 MED ORDER — PROPOFOL 10 MG/ML IV BOLUS
INTRAVENOUS | Status: DC | PRN
  Administered 2021-12-11: 150 mg via INTRAVENOUS

## 2021-12-11 MED ORDER — CHLORHEXIDINE GLUCONATE CLOTH 2 % EX PADS: 6.0000 | MEDICATED_PAD | Freq: Once | CUTANEOUS | Status: DC

## 2021-12-11 MED ORDER — BUPIVACAINE-EPINEPHRINE 0.25% -1:200000 IJ SOLN
INTRAMUSCULAR | Status: DC | PRN
  Administered 2021-12-11: 7 mL

## 2021-12-11 MED ORDER — FENTANYL CITRATE (PF) 100 MCG/2ML IJ SOLN
INTRAMUSCULAR | Status: AC
  Filled 2021-12-11: qty 2

## 2021-12-11 MED ORDER — ONDANSETRON HCL 4 MG/2ML IJ SOLN: 4.0000 mg | Freq: Once | INTRAMUSCULAR | Status: DC | PRN

## 2021-12-11 MED ORDER — MIDAZOLAM HCL 2 MG/2ML IJ SOLN
INTRAMUSCULAR | Status: AC
  Filled 2021-12-11: qty 2

## 2021-12-11 MED ORDER — PHENYLEPHRINE HCL (PRESSORS) 10 MG/ML IV SOLN
INTRAVENOUS | Status: DC | PRN
  Administered 2021-12-11 (×2): 80 ug via INTRAVENOUS

## 2021-12-11 MED ORDER — LIDOCAINE HCL (CARDIAC) PF 100 MG/5ML IV SOSY
PREFILLED_SYRINGE | INTRAVENOUS | Status: DC | PRN
  Administered 2021-12-11: 60 mg via INTRAVENOUS

## 2021-12-11 MED ORDER — FENTANYL CITRATE (PF) 100 MCG/2ML IJ SOLN
INTRAMUSCULAR | Status: DC | PRN
  Administered 2021-12-11: 100 ug via INTRAVENOUS
  Administered 2021-12-11: 50 ug via INTRAVENOUS

## 2021-12-11 MED ORDER — CEFAZOLIN SODIUM-DEXTROSE 2-4 GM/100ML-% IV SOLN
INTRAVENOUS | Status: AC
  Filled 2021-12-11: qty 100

## 2021-12-11 MED ORDER — LACTATED RINGERS IV SOLN: INTRAVENOUS | Status: DC

## 2021-12-11 MED ORDER — EPINEPHRINE PF 1 MG/ML IJ SOLN
INTRAMUSCULAR | Status: AC
  Filled 2021-12-11: qty 1

## 2021-12-11 MED ORDER — LACTATED RINGERS IV SOLN
INTRAVENOUS | Status: DC | PRN
  Administered 2021-12-11: 1700 mL

## 2021-12-11 MED ORDER — SUGAMMADEX SODIUM 500 MG/5ML IV SOLN
INTRAVENOUS | Status: DC | PRN
  Administered 2021-12-11: 300 mg via INTRAVENOUS

## 2021-12-11 MED ORDER — MIDAZOLAM HCL 5 MG/5ML IJ SOLN
INTRAMUSCULAR | Status: DC | PRN
  Administered 2021-12-11: 2 mg via INTRAVENOUS

## 2021-12-11 MED ORDER — HYDROMORPHONE HCL 1 MG/ML IJ SOLN: 0.2500 mg | INTRAMUSCULAR | Status: DC | PRN

## 2021-12-11 SURGICAL SUPPLY — 55 items
ADH SKN CLS APL DERMABOND .7 (GAUZE/BANDAGES/DRESSINGS) ×1
APL PRP STRL LF DISP 70% ISPRP (MISCELLANEOUS) ×1
BINDER ABDOMINAL 10 UNV 27-48 (MISCELLANEOUS) IMPLANT
BINDER ABDOMINAL 12 SM 30-45 (SOFTGOODS) IMPLANT
BINDER BREAST LRG (GAUZE/BANDAGES/DRESSINGS) IMPLANT
BINDER BREAST XLRG (GAUZE/BANDAGES/DRESSINGS) IMPLANT
BINDER BREAST XXLRG (GAUZE/BANDAGES/DRESSINGS) IMPLANT
BLADE SURG 10 STRL SS (BLADE) IMPLANT
BLADE SURG 11 STRL SS (BLADE) ×1 IMPLANT
BNDG CMPR MED 10X6 ELC LF (GAUZE/BANDAGES/DRESSINGS)
BNDG ELASTIC 6X10 VLCR STRL LF (GAUZE/BANDAGES/DRESSINGS) IMPLANT
CANISTER SUCT 1200ML W/VALVE (MISCELLANEOUS) IMPLANT
CHLORAPREP W/TINT 26 (MISCELLANEOUS) ×1 IMPLANT
COVER BACK TABLE 60X90IN (DRAPES) ×1 IMPLANT
COVER MAYO STAND STRL (DRAPES) ×2 IMPLANT
DERMABOND ADVANCED .7 DNX12 (GAUZE/BANDAGES/DRESSINGS) IMPLANT
DRAPE LAPAROSCOPIC ABDOMINAL (DRAPES) IMPLANT
DRAPE TOP ARMCOVERS (MISCELLANEOUS) ×1 IMPLANT
DRAPE U-SHAPE 76X120 STRL (DRAPES) ×1 IMPLANT
DRAPE UTILITY XL STRL (DRAPES) ×1 IMPLANT
ELECT COATED BLADE 2.86 ST (ELECTRODE) IMPLANT
ELECT REM PT RETURN 9FT ADLT (ELECTROSURGICAL) ×1
ELECTRODE REM PT RTRN 9FT ADLT (ELECTROSURGICAL) ×1 IMPLANT
EXTRACTOR CANIST REVOLVE STRL (CANNISTER) ×1 IMPLANT
GAUZE PAD ABD 8X10 STRL (GAUZE/BANDAGES/DRESSINGS) ×2 IMPLANT
GLOVE BIOGEL M STRL SZ7.5 (GLOVE) ×1 IMPLANT
GLOVE BIOGEL PI IND STRL 7.5 (GLOVE) ×1 IMPLANT
GOWN STRL REUS W/ TWL LRG LVL3 (GOWN DISPOSABLE) ×2 IMPLANT
GOWN STRL REUS W/TWL LRG LVL3 (GOWN DISPOSABLE) ×2
LINER CANISTER 1000CC FLEX (MISCELLANEOUS) ×1 IMPLANT
NDL SAFETY ECLIP 18X1.5 (MISCELLANEOUS) ×1 IMPLANT
NS IRRIG 1000ML POUR BTL (IV SOLUTION) IMPLANT
PACK BASIN DAY SURGERY FS (CUSTOM PROCEDURE TRAY) ×1 IMPLANT
PAD ALCOHOL SWAB (MISCELLANEOUS) ×1 IMPLANT
PENCIL SMOKE EVACUATOR (MISCELLANEOUS) IMPLANT
SHEET MEDIUM DRAPE 40X70 STRL (DRAPES) ×2 IMPLANT
SLEEVE SCD COMPRESS KNEE MED (STOCKING) ×1 IMPLANT
SPIKE FLUID TRANSFER (MISCELLANEOUS) IMPLANT
SPONGE T-LAP 18X18 ~~LOC~~+RFID (SPONGE) ×1 IMPLANT
STAPLER VISISTAT 35W (STAPLE) IMPLANT
SUT CHROMIC 5 0 RB 1 27 (SUTURE) ×1 IMPLANT
SUT MNCRL AB 4-0 PS2 18 (SUTURE) IMPLANT
SYR 10ML LL (SYRINGE) ×3 IMPLANT
SYR 50ML LL SCALE MARK (SYRINGE) IMPLANT
SYR BULB IRRIG 60ML STRL (SYRINGE) IMPLANT
SYR CONTROL 10ML LL (SYRINGE) IMPLANT
SYR TB 1ML LL NO SAFETY (SYRINGE) ×1 IMPLANT
SYR TOOMEY IRRIG 70ML (MISCELLANEOUS)
SYRINGE TOOMEY IRRIG 70ML (MISCELLANEOUS) IMPLANT
TOWEL GREEN STERILE FF (TOWEL DISPOSABLE) ×2 IMPLANT
TUBE CONNECTING 20X1/4 (TUBING) IMPLANT
TUBING INFILTRATION IT-10001 (TUBING) ×1 IMPLANT
TUBING SET GRADUATE ASPIR 12FT (MISCELLANEOUS) ×1 IMPLANT
UNDERPAD 30X36 HEAVY ABSORB (UNDERPADS AND DIAPERS) ×2 IMPLANT
YANKAUER SUCT BULB TIP NO VENT (SUCTIONS) IMPLANT

## 2021-12-11 NOTE — Discharge Instructions (Addendum)
Activity As tolerated: No showers until 3 days after surgery. Keep abdominal binder and breast binder on 24/7 unless showering or changing dressings. This is important to prevent additional swelling.  NO driving while in pain, taking pain medication or if you are unable to safely react to traffic. No heavy activities. No lifting > 15 pounds.  Take Pain medication as needed for severe pain. Otherwise, you can use ibuprofen or tylenol PRN as well as indicated on your pre-op breast reduction instruction sheet provided at your pre-op appointment.  Diet: Regular. Drink plenty of fluids (water, avoid juice/soda) and eat healthy, high protein, low carbs.  Wound Care: Keep dressing clean & dry. You may change bandages after showering if you continue to notice some drainage.  Special Instructions: Call Doctor if any unusual problems occur such as pain, excessive Bleeding, unrelieved Nausea/vomiting, Fever &/or chills  Drainage from the abdominal incisions is normal and expected. Drainage will appear yellow, orange or even red at times.  Follow-up appointment: Scheduled for next week.     Post Anesthesia Home Care Instructions  Activity: Get plenty of rest for the remainder of the day. A responsible individual must stay with you for 24 hours following the procedure.  For the next 24 hours, DO NOT: -Drive a car -Paediatric nurse -Drink alcoholic beverages -Take any medication unless instructed by your physician -Make any legal decisions or sign important papers.  Meals: Start with liquid foods such as gelatin or soup. Progress to regular foods as tolerated. Avoid greasy, spicy, heavy foods. If nausea and/or vomiting occur, drink only clear liquids until the nausea and/or vomiting subsides. Call your physician if vomiting continues.  Special Instructions/Symptoms: Your throat may feel dry or sore from the anesthesia or the breathing tube placed in your throat during surgery. If this causes  discomfort, gargle with warm salt water. The discomfort should disappear within 24 hours.  If you had a scopolamine patch placed behind your ear for the management of post- operative nausea and/or vomiting:  1. The medication in the patch is effective for 72 hours, after which it should be removed.  Wrap patch in a tissue and discard in the trash. Wash hands thoroughly with soap and water. 2. You may remove the patch earlier than 72 hours if you experience unpleasant side effects which may include dry mouth, dizziness or visual disturbances. 3. Avoid touching the patch. Wash your hands with soap and water after contact with the patch.

## 2021-12-11 NOTE — Anesthesia Postprocedure Evaluation (Signed)
Anesthesia Post Note  Patient: Connie Underwood  Procedure(s) Performed: Fat grafting to right breast from abdomen (Right: Breast)     Patient location during evaluation: PACU Anesthesia Type: General Level of consciousness: awake and alert Pain management: pain level controlled Vital Signs Assessment: post-procedure vital signs reviewed and stable Respiratory status: spontaneous breathing, nonlabored ventilation, respiratory function stable and patient connected to nasal cannula oxygen Cardiovascular status: blood pressure returned to baseline and stable Postop Assessment: no apparent nausea or vomiting Anesthetic complications: no   No notable events documented.  Last Vitals:  Vitals:   12/11/21 1330 12/11/21 1424  BP: 116/66 117/68  Pulse: 87 81  Resp: 18 16  Temp:  (!) 36.4 C  SpO2: 98% 100%    Last Pain:  Vitals:   12/11/21 1424  TempSrc:   PainSc: Bliss Slayden Mennenga

## 2021-12-11 NOTE — Anesthesia Procedure Notes (Signed)
Procedure Name: LMA Insertion Date/Time: 12/11/2021 11:34 AM  Performed by: Willa Frater, CRNAPre-anesthesia Checklist: Patient identified, Emergency Drugs available, Suction available and Patient being monitored Patient Re-evaluated:Patient Re-evaluated prior to induction Oxygen Delivery Method: Circle System Utilized Preoxygenation: Pre-oxygenation with 100% oxygen Induction Type: IV induction Ventilation: Mask ventilation without difficulty LMA: LMA inserted LMA Size: 4.0 Number of attempts: 1 Airway Equipment and Method: bite block Placement Confirmation: positive ETCO2 Tube secured with: Tape Dental Injury: Teeth and Oropharynx as per pre-operative assessment

## 2021-12-11 NOTE — Anesthesia Preprocedure Evaluation (Addendum)
Anesthesia Evaluation  Patient identified by MRN, date of birth, ID band Patient awake    Reviewed: Allergy & Precautions, NPO status , Patient's Chart, lab work & pertinent test results  Airway Mallampati: II  TM Distance: >3 FB Neck ROM: Full    Dental  (+) Dental Advisory Given, Partial Upper   Pulmonary neg pulmonary ROS,    Pulmonary exam normal breath sounds clear to auscultation       Cardiovascular negative cardio ROS Normal cardiovascular exam Rhythm:Regular Rate:Normal     Neuro/Psych  Headaches, PSYCHIATRIC DISORDERS Anxiety Depression    GI/Hepatic negative GI ROS, Neg liver ROS,   Endo/Other  Morbid obesityRight Breast Ca S/P mastectomy  Renal/GU negative Renal ROS  negative genitourinary   Musculoskeletal negative musculoskeletal ROS (+)   Abdominal (+) + obese,   Peds  Hematology  (+) Blood dyscrasia, anemia ,   Anesthesia Other Findings   Reproductive/Obstetrics negative OB ROS                           Anesthesia Physical Anesthesia Plan  ASA: 2  Anesthesia Plan: General   Post-op Pain Management: Precedex, Dilaudid IV, Ketamine IV* and Tylenol PO (pre-op)*   Induction: Intravenous  PONV Risk Score and Plan: 4 or greater and Treatment may vary due to age or medical condition, Midazolam, Scopolamine patch - Pre-op, Ondansetron and Dexamethasone  Airway Management Planned: LMA  Additional Equipment: None  Intra-op Plan:   Post-operative Plan: Extubation in OR  Informed Consent: I have reviewed the patients History and Physical, chart, labs and discussed the procedure including the risks, benefits and alternatives for the proposed anesthesia with the patient or authorized representative who has indicated his/her understanding and acceptance.     Dental advisory given  Plan Discussed with: CRNA and Anesthesiologist  Anesthesia Plan Comments:         Anesthesia Quick Evaluation

## 2021-12-11 NOTE — Anesthesia Procedure Notes (Signed)
Procedure Name: Intubation Date/Time: 12/11/2021 12:05 PM  Performed by: Willa Frater, CRNAPre-anesthesia Checklist: Patient identified, Emergency Drugs available, Suction available and Patient being monitored Patient Re-evaluated:Patient Re-evaluated prior to induction Oxygen Delivery Method: Circle system utilized Preoxygenation: Pre-oxygenation with 100% oxygen Induction Type: IV induction Ventilation: Mask ventilation without difficulty Laryngoscope Size: Mac and 3 Grade View: Grade I Tube type: Oral Tube size: 7.0 mm Number of attempts: 1 Airway Equipment and Method: Stylet and Oral airway Placement Confirmation: ETT inserted through vocal cords under direct vision, positive ETCO2 and breath sounds checked- equal and bilateral Secured at: 22 cm Tube secured with: Tape Dental Injury: Teeth and Oropharynx as per pre-operative assessment

## 2021-12-11 NOTE — Transfer of Care (Signed)
Immediate Anesthesia Transfer of Care Note  Patient: Connie Underwood  Procedure(s) Performed: Fat grafting to right breast from abdomen (Right)  Patient Location: PACU  Anesthesia Type:General  Level of Consciousness: awake, alert , oriented, drowsy and patient cooperative  Airway & Oxygen Therapy: Patient Spontanous Breathing and Patient connected to face mask oxygen  Post-op Assessment: Report given to RN and Post -op Vital signs reviewed and stable  Post vital signs: Reviewed and stable  Last Vitals:  Vitals Value Taken Time  BP    Temp    Pulse 103 12/11/21 1301  Resp    SpO2 100 % 12/11/21 1301  Vitals shown include unvalidated device data.  Last Pain:  Vitals:   12/11/21 0858  TempSrc: Oral  PainSc: 0-No pain      Patients Stated Pain Goal: 4 (88/87/57 [id number])  Complications: No notable events documented.

## 2021-12-11 NOTE — Op Note (Signed)
Operative Note   DATE OF OPERATION: 12/11/2021  SURGICAL DEPARTMENT: Plastic Surgery  PREOPERATIVE DIAGNOSES:  right breast cancer, status post implant based reconstruction  POSTOPERATIVE DIAGNOSES:  same  PROCEDURE:  Fat grafting from abdomen to right reconstructed breast  SURGEON: Melene Plan. Saragrace Selke, MD  ASSISTANT: Verdie Shire, PAC  ANESTHESIA:  General.   COMPLICATIONS: None.   INDICATIONS FOR PROCEDURE:  The patient, Connie Underwood is a 46 y.o. female born on 1976-02-21, is here for treatment of right breast reconstruction. MRN: [id number]  CONSENT:  Informed consent was obtained directly from the patient. Risks, benefits and alternatives were fully discussed. Specific risks including but not limited to bleeding, infection, hematoma, seroma, scarring, pain, contracture, asymmetry, wound healing problems, and need for further surgery were all discussed. The patient did have an ample opportunity to have questions answered to satisfaction.   DESCRIPTION OF PROCEDURE:  The patient was taken to the operating room. SCDs were placed and preoperative antibiotics were given. Gener anesthesia was administered, we switched from LMA to endotracheal tube since the patient was moving her abdomen a lot when breathing.  The patient's operative site was prepped and draped in a sterile fashion. A time out was performed and all information was confirmed to be correct.    The case was begun with tumescent to the abdomen above and below the umbilicus and lateral abdomen.  1.75 L of tumescent solution which would consist of 30 cc of quarter percent Marcaine and 1 amp of epinephrine per liter were administered.  This was done with a sprinkler cannula.  Following this we waited a period of greater than 10 minutes and started liposuction primarily with a 5 mm cannula.  Fat was harvested from all of these areas of the abdomen and collected in the revolve system.  Approximately 350 cc of fat was collected in  the revolve.  This was washed 3 times per typical protocol with warm LR.  Following th that was transferred to a 60 cc Toomey syringe and then to 10 cc syringes.  A total of 140 cc of fat was injected primarily to the right upper pole.  Fat was injected medially superiorly and laterally in the right upper pole.  I did not feel like the right upper pole was larger than the left upper pole so no fat was injected to the left breast.  Following this all injection cannula sites and lipo sites were closed with interrupted chromic sutures followed by Dermabond.  Abdominal binder and breast binder were placed.  The advanced practice practitioner (APP) assisted throughout the case.  The APP was essential in retraction and counter traction when needed to make the case progress smoothly.  This retraction and assistance made it possible to see the tissue planes for the procedure.  The assistance was needed for hemostasis, tissue re-approximation and closure of the incision site.    The patient tolerated the procedure well.  There were no complications. The patient was allowed to wake from anesthesia, extubated and taken to the recovery room in satisfactory condition.

## 2021-12-11 NOTE — Interval H&P Note (Signed)
History and Physical Interval Note:  12/11/2021 10:48 AM  Connie Underwood  has presented today for surgery, with the diagnosis of Malignant neoplasm upper-inner quadrant R breast.  The various methods of treatment have been discussed with the patient and family. After consideration of risks, benefits and other options for treatment, the patient has consented to  Procedure(s) with comments: Fat grafting to right breast from abdomen (Right) - Requesting 1.5 hour as a surgical intervention.  The patient's history has been reviewed, patient examined, no change in status, stable for surgery.  I have reviewed the patient's chart and labs.  Questions were answered to the patient's satisfaction.     [name]

## 2021-12-12 ENCOUNTER — Encounter (HOSPITAL_BASED_OUTPATIENT_CLINIC_OR_DEPARTMENT_OTHER): Payer: Self-pay | Admitting: Plastic Surgery

## 2021-12-17 ENCOUNTER — Ambulatory Visit (INDEPENDENT_AMBULATORY_CARE_PROVIDER_SITE_OTHER): Payer: BC Managed Care – PPO | Admitting: Plastic Surgery

## 2021-12-17 DIAGNOSIS — Z713 Dietary counseling and surveillance: Secondary | ICD-10-CM | POA: Diagnosis not present

## 2021-12-17 DIAGNOSIS — Z170 Estrogen receptor positive status [ER+]: Secondary | ICD-10-CM

## 2021-12-17 DIAGNOSIS — C50211 Malignant neoplasm of upper-inner quadrant of right female breast: Secondary | ICD-10-CM

## 2021-12-17 NOTE — Progress Notes (Signed)
Status post fat grafting to right breast from abdomen.  Patient is doing well and notes significant improvement.  Physical exam Right breast with improved upper pole fullness.  Minimal bruising.  Abdomen with satisfactory contour and minimal bruising.  Assessment and plan Patient is doing well after fat grafting to right breast from abdomen.  We discussed follow-up in approximately 6 months for recheck to determine whether additional fat grafting is needed.

## 2022-01-21 ENCOUNTER — Inpatient Hospital Stay: Payer: BC Managed Care – PPO | Attending: Hematology | Admitting: Hematology

## 2022-01-21 ENCOUNTER — Inpatient Hospital Stay: Payer: BC Managed Care – PPO

## 2022-01-21 ENCOUNTER — Other Ambulatory Visit: Payer: Self-pay

## 2022-01-21 ENCOUNTER — Encounter: Payer: Self-pay | Admitting: Hematology

## 2022-01-21 VITALS — BP 123/75 | HR 85 | Temp 99.1°F | Resp 16 | Ht 64.0 in | Wt 240.3 lb

## 2022-01-21 DIAGNOSIS — R232 Flushing: Secondary | ICD-10-CM | POA: Insufficient documentation

## 2022-01-21 DIAGNOSIS — D509 Iron deficiency anemia, unspecified: Secondary | ICD-10-CM | POA: Insufficient documentation

## 2022-01-21 DIAGNOSIS — Z170 Estrogen receptor positive status [ER+]: Secondary | ICD-10-CM | POA: Diagnosis not present

## 2022-01-21 DIAGNOSIS — N920 Excessive and frequent menstruation with regular cycle: Secondary | ICD-10-CM | POA: Insufficient documentation

## 2022-01-21 DIAGNOSIS — Z853 Personal history of malignant neoplasm of breast: Secondary | ICD-10-CM | POA: Insufficient documentation

## 2022-01-21 DIAGNOSIS — C50211 Malignant neoplasm of upper-inner quadrant of right female breast: Secondary | ICD-10-CM

## 2022-01-21 DIAGNOSIS — D500 Iron deficiency anemia secondary to blood loss (chronic): Secondary | ICD-10-CM | POA: Diagnosis not present

## 2022-01-21 DIAGNOSIS — Z79810 Long term (current) use of selective estrogen receptor modulators (SERMs): Secondary | ICD-10-CM | POA: Insufficient documentation

## 2022-01-21 LAB — FERRITIN: Ferritin: 4 ng/mL — ABNORMAL LOW (ref 11–307)

## 2022-01-21 LAB — IRON AND IRON BINDING CAPACITY (CC-WL,HP ONLY)
Iron: 10 ug/dL — ABNORMAL LOW (ref 28–170)
Saturation Ratios: 2 % — ABNORMAL LOW (ref 10.4–31.8)
TIBC: 424 ug/dL (ref 250–450)
UIBC: 414 ug/dL (ref 148–442)

## 2022-01-21 NOTE — Progress Notes (Signed)
[place]   [phone] [phone]   Clinic Follow up Note   Patient Care Team: Kristen Loader, FNP as PCP - General (Family Medicine) Mauro Kaufmann, RN as Oncology Nurse Navigator Rockwell Germany, RN as Oncology Nurse Navigator Rolm Bookbinder, MD as Consulting Physician (General Surgery) Truitt Merle, MD as Consulting Physician (Hematology) Gery Pray, MD as Consulting Physician (Radiation Oncology)  Date of Service:  01/21/2022  CHIEF COMPLAINT: f/u of right breast cancer, anemia  CURRENT THERAPY:  Tamoxifen, started neoadjuvantly on 10/18/20  ASSESSMENT & PLAN:  Connie Underwood is a 46 y.o. pre-menopausal female with   1. Malignant neoplasm of upper-inner quadrant of right breast, ductal carcinoma, pT2N12mM0, Stage IB, ER+/PR+/HER2-, Grade 2, Oncotype RS 19  -presented with a palpable right breast mass. Oncotype on biopsy RS of 19, low risk. -s/p right mastectomy by Dr. WDonne Hazeland tissue expander placement by Dr. PClaudia Desanctison 12/25/20. Pathology showed: IDC with lobular features, grade 2, spanning 3.1 cm; intermediate grade DCIS; margins and lymph nodes negative except one node containing isolated tumor cells -she started tamoxifen on 10/18/20. Tolerating well with hot flashes.  -most recent left mammogram on 08/30/21 was negative. Given concern for enhancement seen on MRI, biopsy was performed on 09/05/21, path was benign. -we again discussed ovarian suppression, mainly in light of her very heavy periods. She can consider Zoladex injections or BSO. I explained these would put her into early menopause. I noted the benefit of the Zoladex is that it's reversible. I encouraged her to discuss with her PCP/GYN. She will think about it.    2. Iron deficient anemia  -long-standing h/o chronic anemia secondary to iron deficiency from menorrhagia -she has received IV Venofer as needed since 10/2020, last 05/10/21. -will obtain labs today to determine if  she needs additional IV iron.     Plan -lab today -continue tamoxifen -if counts are low, will repeat labs in 3 months -lab and f/u in 6 months   No problem-specific Assessment & Plan notes found for this encounter.   SUMMARY OF ONCOLOGIC HISTORY: Oncology History Overview Note  Cancer Staging Malignant neoplasm of upper-inner quadrant of right breast in female, estrogen receptor positive [place] Staging form: Breast, AJCC 8th Edition - Clinical stage from 10/06/2020: Stage IB (cT2, cN0, cM0, G2, ER+, PR+, HER2-) - Signed by FTruitt Merle MD on 10/17/2020 Stage prefix: Initial diagnosis Histologic grading system: 3 grade system - Pathologic stage from 12/25/2020: No Stage Recommended (ypT2, pN116m cM0, G2, ER+, PR+, HER2-) - Signed by FeTruitt MerleMD on 01/22/2021 Stage prefix: Post-therapy Histologic grading system: 3 grade system Residual tumor (R): R0 - None    Malignant neoplasm of upper-inner quadrant of right breast in female, estrogen receptor positive ([place] 10/06/2020 Mammogram   DIGITAL DIAGNOSTIC BILATERAL MAMMOGRAM WITH TOMOSYNTHESIS AND CAD; ULTRASOUND RIGHT BREAST LIMITED  IMPRESSION: Two contiguous hypoechoic masses in the right breast- an irregular hypoechoic mass in the right breast at 12:30 3 cm from the nipple measuring 2.5 x 2.1 x 3.0 cm, and a hypoechoic heterogeneous mass at 12:30 2 cm from the nipple measuring 3.0 x 1.6 x 2.8 cm. Sonographic evaluation of the right axilla does not show any enlarged adenopathy.   10/06/2020 Pathology Results   Diagnosis Breast, right, needle core biopsy - INVASIVE MAMMARY CARCINOMA WITH LOBULAR FEATURES - SEE COMMENT Microscopic Comment The biopsy material shows an infiltrative proliferation of cells with large vesicular nuclei with inconspicuous nucleoli, arranged linearly and in small  clusters. Based on the biopsy, the carcinoma appears Nottingham grade 2 of 3 and measures 0.9 cm in greatest linear extent  E-cadherin is POSITIVE  supporting a ductal origin.  PROGNOSTIC INDICATORS Results: IMMUNOHISTOCHEMICAL AND MORPHOMETRIC ANALYSIS PERFORMED MANUALLY The tumor cells are NEGATIVE for Her2 (1+). Estrogen Receptor: 80%, POSITIVE, STRONG STAINING INTENSITY Progesterone Receptor: 90%, POSITIVE, STRONG STAINING INTENSITY Proliferation Marker Ki67: 10%   10/06/2020 Cancer Staging   Staging form: Breast, AJCC 8th Edition - Clinical stage from 10/06/2020: Stage IB (cT2, cN0, cM0, G2, ER+, PR+, HER2-) - Signed by Truitt Merle, MD on 10/17/2020 Stage prefix: Initial diagnosis Histologic grading system: 3 grade system   10/06/2020 Oncotype testing   RS of 19, distant recurrence risk of 6%, chemotherapy not recommended   10/13/2020 Initial Diagnosis   Malignant neoplasm of upper-inner quadrant of right breast in female, estrogen receptor positive ([place])   10/24/2020 Genetic Testing   Negative hereditary cancer genetic testing: no pathogenic variants detected in [facility] BRCAPlus Panel and [facility] CancerNext-Expanded +RNAinsight.  Variants of uncertain significance detected in [place] at p.Y2H (c.4T>C) and SUFU at  p.A138T (c.412G>A). The report dates are October 24, 2020 and October 27, 2020, respectively.    FH p.Y2H (B.2W>U) variant of uncertain significance was reclassified to "likely benign". Report date is 03/15/2021. SUFU p.A138T (X.324M>W) variant of uncertain significance was reclassified to "benign". Report date is 10/05/2021.  The BRCAplus panel offered by [employer] and includes sequencing and deletion/duplication analysis for the following 8 genes: ATM, BRCA1, BRCA2, CDH1, CHEK2, PALB2, PTEN, and TP53.  The CancerNext-Expanded gene panel offered by [facility] and includes sequencing, rearrangement, and RNA analysis for the following 77 genes: AIP, ALK, APC, ATM, AXIN2, BAP1, BARD1, BLM, BMPR1A, BRCA1, BRCA2, BRIP1, CDC73, CDH1, CDK4, CDKN1B, CDKN2A, CHEK2, CTNNA1, DICER1, FANCC, FH, FLCN, GALNT12, KIF1B, LZTR1, MAX, MEN1, MET, MLH1,  MSH2, MSH3, MSH6, MUTYH, NBN, NF1, NF2, NTHL1, PALB2, PHOX2B, PMS2, POT1, PRKAR1A, PTCH1, PTEN, RAD51C, RAD51D, RB1, RECQL, RET, SDHA, SDHAF2, SDHB, SDHC, SDHD, SMAD4, SMARCA4, SMARCB1, SMARCE1, STK11, SUFU, TMEM127, TP53, TSC1, TSC2, VHL and XRCC2 (sequencing and deletion/duplication); EGFR, EGLN1, HOXB13, KIT, MITF, PDGFRA, POLD1, and POLE (sequencing only); EPCAM and GREM1 (deletion/duplication only).     11/09/2020 Imaging   Breast MRI  IMPRESSION: 1. Known biopsy-proven invasive carcinoma within the upper inner quadrant of the RIGHT breast, measuring 3.6 cm, containing a biopsy clip. Contiguous linear non-mass enhancement extends superior-lateral to the spiculated mass, increasing overall craniocaudal dimension to 3 cm. 2. Additional irregular enhancing mass within the inner RIGHT breast, 3 o'clock axis region, at anterior depth, measuring 7 mm, with suspicious washout kinetics. MRI-guided biopsy is recommended to exclude multifocal disease. 3. Additional linear clumped non-mass enhancement within the upper inner quadrant of the LEFT breast, at posterior depth, measuring 1.8 cm extent, with suspicious washout kinetics. This may represent an intramammary lymph node. MRI-guided biopsy is recommended to exclude contralateral disease.   12/25/2020 Cancer Staging   Staging form: Breast, AJCC 8th Edition - Pathologic stage from 12/25/2020: No Stage Recommended (ypT2, pN59m, cM0, G2, ER+, PR+, HER2-) - Signed by FTruitt Merle MD on 01/22/2021 Stage prefix: Post-therapy Histologic grading system: 3 grade system Residual tumor (R): R0 - None   12/25/2020 Definitive Surgery   FINAL MICROSCOPIC DIAGNOSIS:   A. BREAST, RIGHT, MASTECTOMY:  - Invasive ductal carcinoma with lobular features, grade 2, spanning 3.1 cm.  - Intermediate grade ductal carcinoma in situ.  - Margins are negative for carcinoma.  - Lymphovascular invasion.  - Biopsy site.  -  Fibroadenomatoid change.  - Pseudoangiomatous  stromal hyperplasia.  - Intraductal papilloma.  - See oncology table.   B. LYMPH NODE, RIGHT AXILLARY, SENTINEL EXCISION:  - Isolated tumor cells in one of one lymph node (0/1).   C. LYMPH NODE, RIGHT AXILLARY, SENTINEL EXCISION:  - One of one lymph nodes negative for carcinoma. (0/1).   04/19/2021 Survivorship   SCP delivered by Cira Rue, NP      INTERVAL HISTORY:  Connie Underwood is here for a follow up of breast cancer, anemia. She was last seen by me on 01/23/21, with survivorship in the interim. She presents to the clinic alone. She reports she is doing well overall. She tells me her periods have been heavier on tamoxifen.   All other systems were reviewed with the patient and are negative.  MEDICAL HISTORY:  Past Medical History:  Diagnosis Date   Anemia    Anxiety    Breast cancer ([place])    right   Depression    Family history of prostate cancer 10/18/2020   Headache    migraines   Hx of migraines    Pre-diabetes    Seasonal allergic reaction     SURGICAL HISTORY: Past Surgical History:  Procedure Laterality Date   BREAST RECONSTRUCTION WITH PLACEMENT OF TISSUE EXPANDER AND FLEX HD (ACELLULAR HYDRATED DERMIS) Right 12/25/2020   Procedure: RIGHT BREAST RECONSTRUCTION WITH PLACEMENT OF TISSUE EXPANDER AND FLEX HD (ACELLULAR HYDRATED DERMIS);  Surgeon: Cindra Presume, MD;  Location: [place];  Service: Plastics;  Laterality: Right;   LIPOSUCTION WITH LIPOFILLING Right 12/11/2021   Procedure: Fat grafting to right breast from abdomen;  Surgeon: [name], MD;  Location: [place];  Service: Plastics;  Laterality: Right;   MASTECTOMY W/ SENTINEL NODE BIOPSY Right 12/25/2020   Procedure: RIGHT MASTECTOMY WITH RIGHT AXILLARY SENTINEL LYMPH NODE BIOPSY;  Surgeon: Rolm Bookbinder, MD;  Location: [place];  Service: General;  Laterality: Right;   MASTOPEXY Left 03/12/2021   Procedure: LEFT MASTOPEXY;  Surgeon: Cindra Presume, MD;  Location: [place];   Service: Plastics;  Laterality: Left;   REMOVAL OF BILATERAL TISSUE EXPANDERS WITH PLACEMENT OF BILATERAL BREAST IMPLANTS Right 03/12/2021   Procedure: REMOVAL OF RIGHT TISSUE EXPANDERS WITH PLACEMENT OF RIGHT  BREAST IMPLANTS;  Surgeon: Cindra Presume, MD;  Location: [place];  Service: Plastics;  Laterality: Right;   TUBAL LIGATION      I have reviewed the social history and family history with the patient and they are unchanged from previous note.  ALLERGIES:  is allergic to tomato and pollen extract.  MEDICATIONS:  Current Outpatient Medications  Medication Sig Dispense Refill   EMGALITY 120 MG/ML SOAJ Inject 1 mL into the skin every 30 (thirty) days.     oxyCODONE-acetaminophen (PERCOCET/ROXICET) 5-325 MG tablet Take 1 tablet by mouth every 6 (six) hours as needed for severe pain. 6 tablet 0   SUMAtriptan (IMITREX) 50 MG tablet Take 50 mg by mouth every 2 (two) hours as needed for migraine.     tamoxifen (NOLVADEX) 20 MG tablet TAKE 1 TABLET BY MOUTH EVERY DAY 90 tablet 1   Vitamin D, Ergocalciferol, (DRISDOL) 1.25 MG (50000 UNIT) CAPS capsule Take 50,000 Units by mouth once a week.     No current facility-administered medications for this visit.    PHYSICAL EXAMINATION: ECOG PERFORMANCE STATUS: 1 - Symptomatic but completely ambulatory  Vitals:   01/21/22 1352  BP: 123/75  Pulse: 85  Resp: 16  Temp: 99.1  F (37.3 C)  SpO2: 100%   Wt Readings from Last 3 Encounters:  01/21/22 240 lb 4.8 oz (109 kg)  12/11/21 236 lb 8.9 oz (107.3 kg)  11/30/21 236 lb (107 kg)     GENERAL:alert, no distress and comfortable SKIN: skin color, texture, turgor are normal, no rashes or significant lesions EYES: normal, Conjunctiva are pink and non-injected, sclera clear  NECK: supple, thyroid normal size, non-tender, without nodularity LYMPH:  no palpable lymphadenopathy in the cervical, axillary LUNGS: clear to auscultation and percussion with normal breathing effort HEART: regular rate &  rhythm and no murmurs and no lower extremity edema ABDOMEN:abdomen soft, non-tender and normal bowel sounds Musculoskeletal:no cyanosis of digits and no clubbing  NEURO: alert & oriented x 3 with fluent speech, no focal motor/sensory deficits BREAST: No palpable mass, nodules or adenopathy bilaterally. Breast exam benign.   LABORATORY DATA:  I have reviewed the data as listed    Latest Ref Rng & Units 05/31/2021    9:05 AM 04/19/2021    9:29 AM 02/15/2021    9:40 AM  CBC  WBC 4.0 - 10.5 K/uL 7.5  6.9  6.3   Hemoglobin 12.0 - 15.0 g/dL 11.5  9.8  11.4   Hematocrit 36.0 - 46.0 % 35.2  31.2  36.3   Platelets 150 - 400 K/uL 321  391  362         Latest Ref Rng & Units 05/31/2021    9:05 AM 04/19/2021    9:29 AM 02/15/2021    9:40 AM  CMP  Glucose 70 - 99 mg/dL 101  108  97   BUN 6 - 20 mg/dL 12  8  7    Creatinine 0.44 - 1.00 mg/dL 0.87  0.89  0.83   Sodium 135 - 145 mmol/L 138  138  141   Potassium 3.5 - 5.1 mmol/L 3.6  3.5  3.6   Chloride 98 - 111 mmol/L 111  110  108   CO2 22 - 32 mmol/L 21  19  24    Calcium 8.9 - 10.3 mg/dL 8.8  8.8  8.6   Total Protein 6.5 - 8.1 g/dL 7.0  7.0  7.0   Total Bilirubin 0.3 - 1.2 mg/dL 0.3  0.5  0.3   Alkaline Phos 38 - 126 U/L 41  40  51   AST 15 - 41 U/L 14  16  14    ALT 0 - 44 U/L 9  12  10        RADIOGRAPHIC STUDIES: I have personally reviewed the radiological images as listed and agreed with the findings in the report. No results found.    Orders Placed This Encounter  Procedures   CBC with Differential/Platelet    Standing Status:   Standing    Number of Occurrences:   50    Standing Expiration Date:   01/22/2023   Comprehensive metabolic panel    Standing Status:   Standing    Number of Occurrences:   50    Standing Expiration Date:   01/22/2023   All questions were answered. The patient knows to call the clinic with any problems, questions or concerns. No barriers to learning was detected. The total time spent in the  appointment was 30 minutes.     Truitt Merle, MD 01/21/2022   I, Wilburn Mylar, am acting as scribe for Truitt Merle, MD.   I have reviewed the above documentation for accuracy and completeness, and I agree with the above.

## 2022-01-29 ENCOUNTER — Inpatient Hospital Stay: Payer: BC Managed Care – PPO

## 2022-01-29 ENCOUNTER — Other Ambulatory Visit: Payer: Self-pay

## 2022-01-29 VITALS — BP 123/62 | HR 87 | Temp 98.6°F | Resp 18

## 2022-01-29 DIAGNOSIS — R232 Flushing: Secondary | ICD-10-CM | POA: Diagnosis not present

## 2022-01-29 DIAGNOSIS — D509 Iron deficiency anemia, unspecified: Secondary | ICD-10-CM | POA: Diagnosis not present

## 2022-01-29 DIAGNOSIS — N920 Excessive and frequent menstruation with regular cycle: Secondary | ICD-10-CM | POA: Diagnosis not present

## 2022-01-29 DIAGNOSIS — Z79810 Long term (current) use of selective estrogen receptor modulators (SERMs): Secondary | ICD-10-CM | POA: Diagnosis not present

## 2022-01-29 DIAGNOSIS — D500 Iron deficiency anemia secondary to blood loss (chronic): Secondary | ICD-10-CM

## 2022-01-29 DIAGNOSIS — Z853 Personal history of malignant neoplasm of breast: Secondary | ICD-10-CM | POA: Diagnosis not present

## 2022-01-29 MED ORDER — LORATADINE 10 MG PO TABS
10.0000 mg | ORAL_TABLET | Freq: Once | ORAL | Status: AC
  Administered 2022-01-29: 10 mg via ORAL
  Filled 2022-01-29: qty 1

## 2022-01-29 MED ORDER — SODIUM CHLORIDE 0.9 % IV SOLN
300.0000 mg | Freq: Once | INTRAVENOUS | Status: AC
  Administered 2022-01-29: 300 mg via INTRAVENOUS
  Filled 2022-01-29: qty 300

## 2022-01-29 NOTE — Patient Instructions (Signed)

## 2022-01-29 NOTE — Progress Notes (Signed)
Patient declined to stay for 30 minute post observation. Patient ambulatory and VSS at discharge.  

## 2022-02-04 ENCOUNTER — Inpatient Hospital Stay: Payer: BC Managed Care – PPO | Attending: Hematology

## 2022-02-04 ENCOUNTER — Other Ambulatory Visit: Payer: Self-pay

## 2022-02-04 VITALS — BP 121/78 | HR 83 | Temp 99.4°F | Resp 16

## 2022-02-04 DIAGNOSIS — Z853 Personal history of malignant neoplasm of breast: Secondary | ICD-10-CM | POA: Diagnosis not present

## 2022-02-04 DIAGNOSIS — D500 Iron deficiency anemia secondary to blood loss (chronic): Secondary | ICD-10-CM | POA: Insufficient documentation

## 2022-02-04 DIAGNOSIS — Z79810 Long term (current) use of selective estrogen receptor modulators (SERMs): Secondary | ICD-10-CM | POA: Diagnosis not present

## 2022-02-04 MED ORDER — SODIUM CHLORIDE 0.9 % IV SOLN
300.0000 mg | Freq: Once | INTRAVENOUS | Status: AC
  Administered 2022-02-04: 300 mg via INTRAVENOUS
  Filled 2022-02-04: qty 300

## 2022-02-04 MED ORDER — LORATADINE 10 MG PO TABS
10.0000 mg | ORAL_TABLET | Freq: Once | ORAL | Status: AC
  Administered 2022-02-04: 10 mg via ORAL
  Filled 2022-02-04: qty 1

## 2022-02-04 MED ORDER — SODIUM CHLORIDE 0.9 % IV SOLN: Freq: Once | INTRAVENOUS | Status: AC

## 2022-02-04 NOTE — Progress Notes (Signed)
Pt declined to be observed for 30 minutes post Venofer infusion. Pt tolerated trtmt well w/out incident. VSS at discharge.  Ambulatory to lobby.   

## 2022-02-04 NOTE — Patient Instructions (Signed)

## 2022-02-05 ENCOUNTER — Ambulatory Visit: Payer: BC Managed Care – PPO

## 2022-02-12 ENCOUNTER — Ambulatory Visit: Payer: BC Managed Care – PPO

## 2022-02-18 ENCOUNTER — Other Ambulatory Visit: Payer: Self-pay

## 2022-02-18 ENCOUNTER — Inpatient Hospital Stay: Payer: BC Managed Care – PPO

## 2022-02-18 VITALS — BP 127/85 | HR 82 | Temp 98.6°F | Resp 16

## 2022-02-18 DIAGNOSIS — D500 Iron deficiency anemia secondary to blood loss (chronic): Secondary | ICD-10-CM | POA: Diagnosis not present

## 2022-02-18 DIAGNOSIS — Z853 Personal history of malignant neoplasm of breast: Secondary | ICD-10-CM | POA: Diagnosis not present

## 2022-02-18 DIAGNOSIS — Z79810 Long term (current) use of selective estrogen receptor modulators (SERMs): Secondary | ICD-10-CM | POA: Diagnosis not present

## 2022-02-18 MED ORDER — LORATADINE 10 MG PO TABS
10.0000 mg | ORAL_TABLET | Freq: Once | ORAL | Status: AC
  Administered 2022-02-18: 10 mg via ORAL
  Filled 2022-02-18: qty 1

## 2022-02-18 MED ORDER — SODIUM CHLORIDE 0.9 % IV SOLN: INTRAVENOUS | Status: DC | PRN

## 2022-02-18 MED ORDER — SODIUM CHLORIDE 0.9 % IV SOLN
300.0000 mg | Freq: Once | INTRAVENOUS | Status: AC
  Administered 2022-02-18: 300 mg via INTRAVENOUS
  Filled 2022-02-18: qty 300

## 2022-02-18 NOTE — Patient Instructions (Signed)

## 2022-02-25 DIAGNOSIS — Z124 Encounter for screening for malignant neoplasm of cervix: Secondary | ICD-10-CM | POA: Diagnosis not present

## 2022-02-25 DIAGNOSIS — G43011 Migraine without aura, intractable, with status migrainosus: Secondary | ICD-10-CM | POA: Diagnosis not present

## 2022-02-25 DIAGNOSIS — Z0000 Encounter for general adult medical examination without abnormal findings: Secondary | ICD-10-CM | POA: Diagnosis not present

## 2022-02-25 DIAGNOSIS — F411 Generalized anxiety disorder: Secondary | ICD-10-CM | POA: Diagnosis not present

## 2022-02-25 DIAGNOSIS — E559 Vitamin D deficiency, unspecified: Secondary | ICD-10-CM | POA: Diagnosis not present

## 2022-02-25 DIAGNOSIS — N898 Other specified noninflammatory disorders of vagina: Secondary | ICD-10-CM | POA: Diagnosis not present

## 2022-02-25 DIAGNOSIS — D508 Other iron deficiency anemias: Secondary | ICD-10-CM | POA: Diagnosis not present

## 2022-02-25 DIAGNOSIS — Z131 Encounter for screening for diabetes mellitus: Secondary | ICD-10-CM | POA: Diagnosis not present

## 2022-02-25 DIAGNOSIS — R7303 Prediabetes: Secondary | ICD-10-CM | POA: Diagnosis not present

## 2022-02-25 DIAGNOSIS — F3341 Major depressive disorder, recurrent, in partial remission: Secondary | ICD-10-CM | POA: Diagnosis not present

## 2022-03-06 NOTE — Telephone Encounter (Signed)
error 

## 2022-03-27 DIAGNOSIS — J029 Acute pharyngitis, unspecified: Secondary | ICD-10-CM | POA: Diagnosis not present

## 2022-03-27 DIAGNOSIS — U071 COVID-19: Secondary | ICD-10-CM | POA: Diagnosis not present

## 2022-03-27 DIAGNOSIS — J101 Influenza due to other identified influenza virus with other respiratory manifestations: Secondary | ICD-10-CM | POA: Diagnosis not present

## 2022-04-16 ENCOUNTER — Encounter: Payer: Self-pay | Admitting: Hematology

## 2022-04-23 ENCOUNTER — Other Ambulatory Visit: Payer: BC Managed Care – PPO

## 2022-04-29 ENCOUNTER — Encounter: Payer: Self-pay | Admitting: Hematology

## 2022-04-29 ENCOUNTER — Inpatient Hospital Stay: Payer: No Typology Code available for payment source | Attending: Hematology

## 2022-04-29 ENCOUNTER — Other Ambulatory Visit: Payer: Self-pay

## 2022-04-29 DIAGNOSIS — D500 Iron deficiency anemia secondary to blood loss (chronic): Secondary | ICD-10-CM | POA: Insufficient documentation

## 2022-04-29 DIAGNOSIS — Z170 Estrogen receptor positive status [ER+]: Secondary | ICD-10-CM | POA: Insufficient documentation

## 2022-04-29 DIAGNOSIS — C50211 Malignant neoplasm of upper-inner quadrant of right female breast: Secondary | ICD-10-CM | POA: Insufficient documentation

## 2022-04-29 LAB — CBC WITH DIFFERENTIAL/PLATELET
Abs Immature Granulocytes: 0.02 10*3/uL (ref 0.00–0.07)
Basophils Absolute: 0 10*3/uL (ref 0.0–0.1)
Basophils Relative: 0 %
Eosinophils Absolute: 0.3 10*3/uL (ref 0.0–0.5)
Eosinophils Relative: 3 %
HCT: 32.8 % — ABNORMAL LOW (ref 36.0–46.0)
Hemoglobin: 10.6 g/dL — ABNORMAL LOW (ref 12.0–15.0)
Immature Granulocytes: 0 %
Lymphocytes Relative: 28 %
Lymphs Abs: 2.6 10*3/uL (ref 0.7–4.0)
MCH: 25.3 pg — ABNORMAL LOW (ref 26.0–34.0)
MCHC: 32.3 g/dL (ref 30.0–36.0)
MCV: 78.3 fL — ABNORMAL LOW (ref 80.0–100.0)
Monocytes Absolute: 0.5 10*3/uL (ref 0.1–1.0)
Monocytes Relative: 6 %
Neutro Abs: 5.9 10*3/uL (ref 1.7–7.7)
Neutrophils Relative %: 63 %
Platelets: 321 10*3/uL (ref 150–400)
RBC: 4.19 MIL/uL (ref 3.87–5.11)
RDW: 19.6 % — ABNORMAL HIGH (ref 11.5–15.5)
WBC: 9.4 10*3/uL (ref 4.0–10.5)
nRBC: 0 % (ref 0.0–0.2)

## 2022-04-29 LAB — FERRITIN: Ferritin: 4 ng/mL — ABNORMAL LOW (ref 11–307)

## 2022-05-08 ENCOUNTER — Other Ambulatory Visit: Payer: Self-pay

## 2022-05-08 ENCOUNTER — Telehealth: Payer: Self-pay

## 2022-05-08 ENCOUNTER — Telehealth: Payer: Self-pay | Admitting: Hematology

## 2022-05-08 NOTE — Telephone Encounter (Signed)
Spoke with pt via telephone to inform pt that Dr. Burr Medico reviewed her recent labs.  Informed pt that her iron is low and Dr. Burr Medico would like for the pt to get 3 doses of IV Iron and a lab redraw 3 wks afterwards.  Pt verbalized understanding and had no further questions or concerns.

## 2022-05-08 NOTE — Telephone Encounter (Signed)
Contacted patient to scheduled appointments. Left message with appointment details and a call back number if patient had any questions or could not accommodate the time we provided.   

## 2022-05-13 ENCOUNTER — Telehealth: Payer: Self-pay | Admitting: Hematology

## 2022-05-13 NOTE — Telephone Encounter (Signed)
Spoke with patient confirming appointment changes  

## 2022-05-14 ENCOUNTER — Inpatient Hospital Stay: Payer: No Typology Code available for payment source

## 2022-05-20 ENCOUNTER — Inpatient Hospital Stay: Payer: No Typology Code available for payment source | Attending: Hematology

## 2022-05-20 ENCOUNTER — Other Ambulatory Visit: Payer: Self-pay

## 2022-05-20 VITALS — BP 129/75 | HR 62 | Temp 98.5°F | Resp 16

## 2022-05-20 DIAGNOSIS — N920 Excessive and frequent menstruation with regular cycle: Secondary | ICD-10-CM | POA: Insufficient documentation

## 2022-05-20 DIAGNOSIS — D500 Iron deficiency anemia secondary to blood loss (chronic): Secondary | ICD-10-CM | POA: Insufficient documentation

## 2022-05-20 MED ORDER — LORATADINE 10 MG PO TABS
10.0000 mg | ORAL_TABLET | Freq: Once | ORAL | Status: AC
  Administered 2022-05-20: 10 mg via ORAL
  Filled 2022-05-20: qty 1

## 2022-05-20 MED ORDER — SODIUM CHLORIDE 0.9 % IV SOLN
300.0000 mg | Freq: Once | INTRAVENOUS | Status: AC
  Administered 2022-05-20: 300 mg via INTRAVENOUS
  Filled 2022-05-20: qty 300

## 2022-05-20 MED ORDER — SODIUM CHLORIDE 0.9 % IV SOLN: Freq: Once | INTRAVENOUS | Status: AC

## 2022-05-20 MED ORDER — ACETAMINOPHEN 325 MG PO TABS: 650.0000 mg | ORAL_TABLET | Freq: Once | ORAL | Status: DC

## 2022-05-20 NOTE — Patient Instructions (Signed)

## 2022-05-20 NOTE — Progress Notes (Signed)
Patient has had quite a bit of iron previously- never had any issues. Declined to stay for her 30 minute observation. Patient has no stayed for 30 minutes upon any recent treatments. VSS- BP 129/75 (BP Location: Left Arm, Patient Position: Sitting)   Pulse 62   Temp 98.5 F (36.9 C) (Oral)   Resp 16   SpO2 100%   Ambulatory to the lobby.

## 2022-05-21 ENCOUNTER — Inpatient Hospital Stay: Payer: No Typology Code available for payment source

## 2022-05-27 ENCOUNTER — Inpatient Hospital Stay: Payer: No Typology Code available for payment source

## 2022-05-27 VITALS — BP 109/65 | HR 82 | Temp 98.9°F | Resp 18

## 2022-05-27 DIAGNOSIS — D500 Iron deficiency anemia secondary to blood loss (chronic): Secondary | ICD-10-CM | POA: Diagnosis not present

## 2022-05-27 MED ORDER — SODIUM CHLORIDE 0.9 % IV SOLN: Freq: Once | INTRAVENOUS | Status: AC

## 2022-05-27 MED ORDER — SODIUM CHLORIDE 0.9 % IV SOLN
300.0000 mg | Freq: Once | INTRAVENOUS | Status: AC
  Administered 2022-05-27: 300 mg via INTRAVENOUS
  Filled 2022-05-27: qty 300

## 2022-05-27 MED ORDER — LORATADINE 10 MG PO TABS
10.0000 mg | ORAL_TABLET | Freq: Once | ORAL | Status: AC
  Administered 2022-05-27: 10 mg via ORAL
  Filled 2022-05-27: qty 1

## 2022-05-27 MED ORDER — ACETAMINOPHEN 325 MG PO TABS
650.0000 mg | ORAL_TABLET | Freq: Once | ORAL | Status: AC
  Administered 2022-05-27: 650 mg via ORAL
  Filled 2022-05-27: qty 2

## 2022-05-28 ENCOUNTER — Ambulatory Visit: Payer: No Typology Code available for payment source

## 2022-05-29 ENCOUNTER — Other Ambulatory Visit: Payer: Self-pay

## 2022-06-03 ENCOUNTER — Ambulatory Visit: Payer: No Typology Code available for payment source

## 2022-06-03 ENCOUNTER — Inpatient Hospital Stay: Payer: No Typology Code available for payment source

## 2022-06-10 ENCOUNTER — Inpatient Hospital Stay: Payer: No Typology Code available for payment source | Attending: Hematology

## 2022-06-13 ENCOUNTER — Encounter: Payer: Self-pay | Admitting: Hematology

## 2022-06-17 ENCOUNTER — Ambulatory Visit: Payer: BC Managed Care – PPO | Admitting: Plastic Surgery

## 2022-07-02 ENCOUNTER — Inpatient Hospital Stay (HOSPITAL_BASED_OUTPATIENT_CLINIC_OR_DEPARTMENT_OTHER)
Admission: EM | Admit: 2022-07-02 | Discharge: 2022-07-08 | DRG: 065 | Disposition: A | Discharge status: Home / Self Care | Payer: No Typology Code available for payment source | Attending: Neurosurgery | Admitting: Neurosurgery

## 2022-07-02 ENCOUNTER — Emergency Department (HOSPITAL_BASED_OUTPATIENT_CLINIC_OR_DEPARTMENT_OTHER): Payer: No Typology Code available for payment source

## 2022-07-02 DIAGNOSIS — Z9011 Acquired absence of right breast and nipple: Secondary | ICD-10-CM

## 2022-07-02 DIAGNOSIS — Z91018 Allergy to other foods: Secondary | ICD-10-CM

## 2022-07-02 DIAGNOSIS — I609 Nontraumatic subarachnoid hemorrhage, unspecified: Secondary | ICD-10-CM | POA: Diagnosis not present

## 2022-07-02 DIAGNOSIS — Z8042 Family history of malignant neoplasm of prostate: Secondary | ICD-10-CM

## 2022-07-02 DIAGNOSIS — Z8679 Personal history of other diseases of the circulatory system: Secondary | ICD-10-CM

## 2022-07-02 DIAGNOSIS — Z6841 Body Mass Index (BMI) 40.0 and over, adult: Secondary | ICD-10-CM

## 2022-07-02 DIAGNOSIS — Z79620 Long term (current) use of immunosuppressive biologic: Secondary | ICD-10-CM

## 2022-07-02 DIAGNOSIS — Z9109 Other allergy status, other than to drugs and biological substances: Secondary | ICD-10-CM

## 2022-07-02 DIAGNOSIS — Z9882 Breast implant status: Secondary | ICD-10-CM

## 2022-07-02 DIAGNOSIS — Z79899 Other long term (current) drug therapy: Secondary | ICD-10-CM

## 2022-07-02 DIAGNOSIS — Z853 Personal history of malignant neoplasm of breast: Secondary | ICD-10-CM

## 2022-07-02 HISTORY — DX: Personal history of other diseases of the circulatory system: Z86.79

## 2022-07-02 LAB — BASIC METABOLIC PANEL
Anion gap: 7 (ref 5–15)
BUN: 10 mg/dL (ref 6–20)
CO2: 23 mmol/L (ref 22–32)
Calcium: 8 mg/dL — ABNORMAL LOW (ref 8.9–10.3)
Chloride: 105 mmol/L (ref 98–111)
Creatinine, Ser: 0.77 mg/dL (ref 0.44–1.00)
GFR, Estimated: >60 mL/min (ref >60)
Glucose, Bld: 129 mg/dL — ABNORMAL HIGH (ref 70–99)
Potassium: 3.2 mmol/L — ABNORMAL LOW (ref 3.5–5.1)
Sodium: 135 mmol/L (ref 135–145)

## 2022-07-02 LAB — CBC WITH DIFFERENTIAL/PLATELET
Abs Immature Granulocytes: 0.06 10*3/uL (ref 0.00–0.07)
Basophils Absolute: 0 10*3/uL (ref 0.0–0.1)
Basophils Relative: 0 %
Eosinophils Absolute: 0.2 10*3/uL (ref 0.0–0.5)
Eosinophils Relative: 1 %
HCT: 30.6 % — ABNORMAL LOW (ref 36.0–46.0)
Hemoglobin: 9.5 g/dL — ABNORMAL LOW (ref 12.0–15.0)
Immature Granulocytes: 1 %
Lymphocytes Relative: 18 %
Lymphs Abs: 2.1 10*3/uL (ref 0.7–4.0)
MCH: 25.7 pg — ABNORMAL LOW (ref 26.0–34.0)
MCHC: 31 g/dL (ref 30.0–36.0)
MCV: 82.9 fL (ref 80.0–100.0)
Monocytes Absolute: 0.6 10*3/uL (ref 0.1–1.0)
Monocytes Relative: 5 %
Neutro Abs: 8.8 10*3/uL — ABNORMAL HIGH (ref 1.7–7.7)
Neutrophils Relative %: 75 %
Platelets: 381 10*3/uL (ref 150–400)
RBC: 3.69 MIL/uL — ABNORMAL LOW (ref 3.87–5.11)
RDW: 18.1 % — ABNORMAL HIGH (ref 11.5–15.5)
WBC: 11.8 10*3/uL — ABNORMAL HIGH (ref 4.0–10.5)
nRBC: 0 % (ref 0.0–0.2)

## 2022-07-02 MED ORDER — IOHEXOL 350 MG/ML SOLN
75.0000 mL | Freq: Once | INTRAVENOUS | Status: AC | PRN
  Administered 2022-07-02: 75 mL via INTRAVENOUS

## 2022-07-02 MED ORDER — ONDANSETRON HCL 4 MG/2ML IJ SOLN
4.0000 mg | Freq: Once | INTRAMUSCULAR | Status: AC
  Administered 2022-07-02: 4 mg via INTRAVENOUS
  Filled 2022-07-02: qty 2

## 2022-07-02 MED ORDER — LACTATED RINGERS IV SOLN: INTRAVENOUS | Status: DC

## 2022-07-02 MED ORDER — HYDROMORPHONE HCL 1 MG/ML IJ SOLN
1.0000 mg | Freq: Once | INTRAMUSCULAR | Status: AC
  Administered 2022-07-02: 1 mg via INTRAVENOUS
  Filled 2022-07-02: qty 1

## 2022-07-02 NOTE — ED Notes (Signed)
Patient was escorted to CT by this Paramedic.

## 2022-07-02 NOTE — ED Triage Notes (Addendum)
Pt via [facility] for eval of NV, s/p "episode" at [facility]. Pt states she "started having a hot flash" followed by NVD. Symptoms resolved upon arrival to ED. Pt states she has "horrible HA," endorses hx migraines "but this is worse." Pt disoriented, states she feels she is going to pass out.   VSS, 18RAC w 4mg  zofran PTA

## 2022-07-02 NOTE — ED Provider Notes (Signed)
[place] [facility] Provider Note   CSN: [id number] Arrival date & time: 07/02/22  2113     History  Chief Complaint  Patient presents with   Nausea   Emesis    Connie Underwood is a 47 y.o. female.  HPI Patient reports she was at a concert.  She reports about 730 she started to feel hot and dizzy.  She had a sudden onset of a severe headache.  She reports she feels nauseated.  No loss of consciousness.  No visual changes.  No focal weakness numbness or tingling.  She reports she generally feels bad and has persistent headache.  She denies any past medical history.  No history of hypertension.  She reports she does have history of migraines.    Home Medications Prior to Admission medications   Medication Sig Start Date End Date Taking? Authorizing Provider  EMGALITY 120 MG/ML SOAJ Inject 1 mL into the skin every 30 (thirty) days. 07/13/20   [provider]  oxyCODONE-acetaminophen (PERCOCET/ROXICET) 5-325 MG tablet Take 1 tablet by mouth every 6 (six) hours as needed for severe pain. 11/30/21   Hedges, Dellis Filbert, PA-C  SUMAtriptan (IMITREX) 50 MG tablet Take 50 mg by mouth every 2 (two) hours as needed for migraine. 05/19/17   [provider]  tamoxifen (NOLVADEX) 20 MG tablet TAKE 1 TABLET BY MOUTH EVERY DAY 08/07/21   Truitt Merle, MD  Vitamin D, Ergocalciferol, (DRISDOL) 1.25 MG (50000 UNIT) CAPS capsule Take 50,000 Units by mouth once a week. 05/16/20   [provider]      Allergies    Tomato and Pollen extract    Review of Systems   Review of Systems  Physical Exam Updated Vital Signs BP 121/72 (BP Location: Left Arm)   Pulse 83   Temp 98 F (36.7 C)   Resp 20   SpO2 96%  Physical Exam Constitutional:      Comments: Patient is alert.  Nontoxic.  Very uncomfortable in appearance.  HENT:     Head: Normocephalic and atraumatic.     Mouth/Throat:     Mouth: Mucous membranes are moist.     Pharynx: Oropharynx  is clear.  Eyes:     Extraocular Movements: Extraocular movements intact.     Pupils: Pupils are equal, round, and reactive to light.  Cardiovascular:     Rate and Rhythm: Normal rate and regular rhythm.  Pulmonary:     Effort: Pulmonary effort is normal.     Breath sounds: Normal breath sounds.  Abdominal:     General: There is no distension.     Palpations: Abdomen is soft.     Tenderness: There is no abdominal tenderness.  Musculoskeletal:        General: Normal range of motion.  Skin:    General: Skin is warm and dry.  Neurological:     General: No focal deficit present.     Mental Status: She is oriented to person, place, and time.     ED Results / Procedures / Treatments   Labs (all labs ordered are listed, but only abnormal results are displayed) Labs Reviewed  BASIC METABOLIC PANEL - Abnormal; Notable for the following components:      Result Value   Potassium 3.2 (*)    Glucose, Bld 129 (*)    Calcium 8.0 (*)    All other components within normal limits  CBC WITH DIFFERENTIAL/PLATELET - Abnormal; Notable for the following components:  WBC 11.8 (*)    RBC 3.69 (*)    Hemoglobin 9.5 (*)    HCT 30.6 (*)    MCH 25.7 (*)    RDW 18.1 (*)    Neutro Abs 8.8 (*)    All other components within normal limits  PROTIME-INR    EKG None  Radiology CT Angio Head W or Wo Contrast  Result Date: 07/02/2022 CLINICAL DATA:  Subarachnoid hemorrhage on CT head EXAM: CT ANGIOGRAPHY HEAD TECHNIQUE: Multidetector CT imaging of the head was performed using the standard protocol during bolus administration of intravenous contrast. Multiplanar CT image reconstructions and MIPs were obtained to evaluate the vascular anatomy. RADIATION DOSE REDUCTION: This exam was performed according to the departmental dose-optimization program which includes automated exposure control, adjustment of the mA and/or kV according to patient size and/or use of iterative reconstruction technique. CONTRAST:   52mL OMNIPAQUE IOHEXOL 350 MG/ML SOLN COMPARISON:  No prior CTA available, correlation is made with CT head 07/02/2022 FINDINGS: Anterior circulation: Both internal carotid arteries are patent to the termini, without significant stenosis. A1 segments patent, hypoplastic on the right. Normal anterior communicating artery. Anterior cerebral arteries are patent to their distal aspects without significant stenosis. No M1 stenosis or occlusion. MCA branches perfused to their distal aspects without significant stenosis. Posterior circulation: Vertebral arteries patent to the vertebrobasilar junction without significant stenosis. Posterior inferior cerebellar arteries patent proximally. Basilar patent to its distal aspect without significant stenosis. Superior cerebellar arteries patent proximally. Patent P1 segments. PCAs perfused to their distal aspects without significant stenosis. The bilateral posterior communicating arteries are not visualized. Venous sinuses: As permitted by contrast timing, patent. Anatomic variants: None significant. Review of the MIP images confirms the above findings No evidence of aneurysm or vascular malformation. IMPRESSION: 1. No intracranial aneurysm identified. 2. No large vessel occlusion or hemodynamically significant stenosis. Electronically Signed   By: Merilyn Baba M.D.   On: 07/02/2022 22:48   CT Head Wo Contrast  Result Date: 07/02/2022 CLINICAL DATA:  Headache, sudden, severe EXAM: CT HEAD WITHOUT CONTRAST TECHNIQUE: Contiguous axial images were obtained from the base of the skull through the vertex without intravenous contrast. RADIATION DOSE REDUCTION: This exam was performed according to the departmental dose-optimization program which includes automated exposure control, adjustment of the mA and/or kV according to patient size and/or use of iterative reconstruction technique. COMPARISON:  07/13/2019 FINDINGS: Brain: There is acute mild subarachnoid hemorrhage within the  left sylvian fissure and, to a lesser extent, the suprasellar cistern. No associated abnormal mass effect or midline shift. No abnormal intra or extra-axial mass lesion. No evidence of acute cortical infarct. Ventricular size is normal. Cerebellum is unremarkable. Vascular: No asymmetric hyperdense vasculature at the skull base. Skull: Normal. Negative for fracture or focal lesion. Sinuses/Orbits: No acute finding. Other: Mastoid air cells and middle ear cavities are clear. IMPRESSION: 1. Acute mild subarachnoid hemorrhage within the left Sylvian fissure and, to a lesser extent, the suprasellar cistern. No associated abnormal mass effect or midline shift. CT arteriography is recommended for further evaluation. Electronically Signed   By: Fidela Salisbury M.D.   On: 07/02/2022 21:46    Procedures Procedures   CRITICAL CARE Performed by: Charlesetta Shanks   Total critical care time: 30 minutes  Critical care time was exclusive of separately billable procedures and treating other patients.  Critical care was necessary to treat or prevent imminent or life-threatening deterioration.  Critical care was time spent personally by me on the following activities: development of treatment  plan with patient and/or surrogate as well as nursing, discussions with consultants, evaluation of patient's response to treatment, examination of patient, obtaining history from patient or surrogate, ordering and performing treatments and interventions, ordering and review of laboratory studies, ordering and review of radiographic studies, pulse oximetry and re-evaluation of patient's condition.  Medications Ordered in ED Medications  lactated ringers infusion ( Intravenous New Bag/Given 07/02/22 2202)  iohexol (OMNIPAQUE) 350 MG/ML injection 75 mL (75 mLs Intravenous Contrast Given 07/02/22 2228)  HYDROmorphone (DILAUDID) injection 1 mg (1 mg Intravenous Given 07/02/22 2220)  ondansetron (ZOFRAN) injection 4 mg (4 mg Intravenous  Given 07/02/22 2220)    ED Course/ Medical Decision Making/ A&P                             Medical Decision Making Amount and/or Complexity of Data Reviewed Labs: ordered. Radiology: ordered.  Risk Prescription drug management. Decision regarding hospitalization.  Patient presents with acute onset of severe headache while at a concert.  Patient does have history of migraines however headache was more sudden and severe.  Differential diagnosis includes subarachnoid hemorrhage vs migraine.  Patient is very uncomfortable in appearance but mental status is clear.  GCS is 15.  No focal motor deficits.  No visual changes.  CT head reviewed by radiology and also visually reviewed by myself positive for subarachnoid hemorrhage in sylvian fissure no mass effect.   Consult: Reviewed with neurosurgery PA-C Tara.  Plan for admission to neurosurgery service.  Call back as soon as CTA complete.  CT angiogram head interpreted by radiology does not show any aneurysm.  I have reviewed with neurosurgery team and plan will be for admission to neurosurgical ICU.  Patient's pain treated with Dilaudid and Zofran.  Upon recheck she reports headache is much improved.  At this time stable.  No somnolence.  Stable for admission and transfer.        Final Clinical Impression(s) / ED Diagnoses Final diagnoses:  SAH (subarachnoid hemorrhage)    Rx / DC Orders ED Discharge Orders     None         Charlesetta Shanks, MD 07/02/22 2312

## 2022-07-03 ENCOUNTER — Inpatient Hospital Stay (HOSPITAL_COMMUNITY): Payer: No Typology Code available for payment source

## 2022-07-03 ENCOUNTER — Encounter (HOSPITAL_COMMUNITY): Payer: Self-pay | Admitting: Neurosurgery

## 2022-07-03 ENCOUNTER — Encounter (HOSPITAL_COMMUNITY)
Admission: EM | Disposition: A | Discharge status: Home / Self Care | Payer: Self-pay | Source: Home / Self Care | Attending: Neurosurgery

## 2022-07-03 ENCOUNTER — Inpatient Hospital Stay (HOSPITAL_COMMUNITY): Payer: No Typology Code available for payment source | Admitting: Certified Registered Nurse Anesthetist

## 2022-07-03 ENCOUNTER — Other Ambulatory Visit: Payer: Self-pay

## 2022-07-03 DIAGNOSIS — Z9109 Other allergy status, other than to drugs and biological substances: Secondary | ICD-10-CM | POA: Diagnosis not present

## 2022-07-03 DIAGNOSIS — D649 Anemia, unspecified: Secondary | ICD-10-CM

## 2022-07-03 DIAGNOSIS — Z9882 Breast implant status: Secondary | ICD-10-CM | POA: Diagnosis not present

## 2022-07-03 DIAGNOSIS — Z853 Personal history of malignant neoplasm of breast: Secondary | ICD-10-CM | POA: Diagnosis not present

## 2022-07-03 DIAGNOSIS — Z6841 Body Mass Index (BMI) 40.0 and over, adult: Secondary | ICD-10-CM | POA: Diagnosis not present

## 2022-07-03 DIAGNOSIS — Z79620 Long term (current) use of immunosuppressive biologic: Secondary | ICD-10-CM | POA: Diagnosis not present

## 2022-07-03 DIAGNOSIS — I609 Nontraumatic subarachnoid hemorrhage, unspecified: Secondary | ICD-10-CM | POA: Diagnosis present

## 2022-07-03 DIAGNOSIS — Z79899 Other long term (current) drug therapy: Secondary | ICD-10-CM | POA: Diagnosis not present

## 2022-07-03 DIAGNOSIS — Z91018 Allergy to other foods: Secondary | ICD-10-CM | POA: Diagnosis not present

## 2022-07-03 DIAGNOSIS — Z8042 Family history of malignant neoplasm of prostate: Secondary | ICD-10-CM | POA: Diagnosis not present

## 2022-07-03 DIAGNOSIS — Z9011 Acquired absence of right breast and nipple: Secondary | ICD-10-CM | POA: Diagnosis not present

## 2022-07-03 HISTORY — PX: RADIOLOGY WITH ANESTHESIA: SHX6223

## 2022-07-03 HISTORY — PX: IR ANGIO EXTERNAL CAROTID SEL EXT CAROTID BILAT MOD SED: IMG5372

## 2022-07-03 HISTORY — PX: IR ANGIO VERTEBRAL SEL VERTEBRAL UNI L MOD SED: IMG5367

## 2022-07-03 HISTORY — PX: IR ANGIO INTRA EXTRACRAN SEL INTERNAL CAROTID BILAT MOD SED: IMG5363

## 2022-07-03 LAB — ABO/RH: ABO/RH(D): O POS

## 2022-07-03 LAB — PROTIME-INR
INR: 1.2 (ref 0.8–1.2)
INR: 1.1 (ref 0.8–1.2)
Prothrombin Time: 14.6 seconds (ref 11.4–15.2)
Prothrombin Time: 14.1 seconds (ref 11.4–15.2)

## 2022-07-03 LAB — TYPE AND SCREEN
ABO/RH(D): O POS
Antibody Screen: NEGATIVE

## 2022-07-03 LAB — HIV ANTIBODY (ROUTINE TESTING W REFLEX): HIV Screen 4th Generation wRfx: NONREACTIVE

## 2022-07-03 LAB — CBC
HCT: 29.9 % — ABNORMAL LOW (ref 36.0–46.0)
Hemoglobin: 9.7 g/dL — ABNORMAL LOW (ref 12.0–15.0)
MCH: 26.4 pg (ref 26.0–34.0)
MCHC: 32.4 g/dL (ref 30.0–36.0)
MCV: 81.5 fL (ref 80.0–100.0)
Platelets: 353 10*3/uL (ref 150–400)
RBC: 3.67 MIL/uL — ABNORMAL LOW (ref 3.87–5.11)
RDW: 18.2 % — ABNORMAL HIGH (ref 11.5–15.5)
WBC: 13.3 10*3/uL — ABNORMAL HIGH (ref 4.0–10.5)
nRBC: 0 % (ref 0.0–0.2)

## 2022-07-03 LAB — APTT: aPTT: 25 seconds (ref 24–36)

## 2022-07-03 LAB — MRSA NEXT GEN BY PCR, NASAL: MRSA by PCR Next Gen: NOT DETECTED

## 2022-07-03 LAB — RAPID URINE DRUG SCREEN, HOSP PERFORMED
Amphetamines: NOT DETECTED
Barbiturates: NOT DETECTED
Benzodiazepines: NOT DETECTED
Cocaine: NOT DETECTED
Opiates: NOT DETECTED
Tetrahydrocannabinol: NOT DETECTED

## 2022-07-03 SURGERY — IR WITH ANESTHESIA
Anesthesia: Monitor Anesthesia Care

## 2022-07-03 MED ORDER — PANTOPRAZOLE SODIUM 40 MG PO TBEC
40.0000 mg | DELAYED_RELEASE_TABLET | Freq: Every day | ORAL | Status: DC
  Administered 2022-07-03 – 2022-07-08 (×6): 40 mg via ORAL
  Filled 2022-07-03 (×6): qty 1

## 2022-07-03 MED ORDER — STROKE: EARLY STAGES OF RECOVERY BOOK
Freq: Once | Status: DC
  Filled 2022-07-03: qty 1

## 2022-07-03 MED ORDER — DOCUSATE SODIUM 100 MG PO CAPS
100.0000 mg | ORAL_CAPSULE | Freq: Two times a day (BID) | ORAL | Status: DC
  Administered 2022-07-05 – 2022-07-06 (×3): 100 mg via ORAL
  Filled 2022-07-03 (×6): qty 1

## 2022-07-03 MED ORDER — PANTOPRAZOLE SODIUM 40 MG IV SOLR: 40.0000 mg | Freq: Every day | INTRAVENOUS | Status: DC

## 2022-07-03 MED ORDER — ONDANSETRON HCL 4 MG/2ML IJ SOLN: 4.0000 mg | Freq: Four times a day (QID) | INTRAMUSCULAR | Status: DC | PRN

## 2022-07-03 MED ORDER — ORAL CARE MOUTH RINSE: 15.0000 mL | Freq: Once | OROMUCOSAL | Status: AC

## 2022-07-03 MED ORDER — PROPOFOL 10 MG/ML IV BOLUS
INTRAVENOUS | Status: DC | PRN
  Administered 2022-07-03: 25 mg via INTRAVENOUS

## 2022-07-03 MED ORDER — CLEVIDIPINE BUTYRATE 0.5 MG/ML IV EMUL: 0.0000 mg/h | INTRAVENOUS | Status: DC

## 2022-07-03 MED ORDER — FENTANYL CITRATE (PF) 100 MCG/2ML IJ SOLN
INTRAMUSCULAR | Status: DC | PRN
  Administered 2022-07-03 (×2): 50 ug via INTRAVENOUS

## 2022-07-03 MED ORDER — CHLORHEXIDINE GLUCONATE CLOTH 2 % EX PADS
6.0000 | MEDICATED_PAD | Freq: Every day | CUTANEOUS | Status: DC
  Administered 2022-07-03 – 2022-07-08 (×5): 6 via TOPICAL

## 2022-07-03 MED ORDER — IOHEXOL 300 MG/ML  SOLN
150.0000 mL | Freq: Once | INTRAMUSCULAR | Status: AC | PRN
  Administered 2022-07-03: 72 mL via INTRA_ARTERIAL

## 2022-07-03 MED ORDER — LIDOCAINE HCL 1 % IJ SOLN
10.0000 mL | Freq: Once | INTRAMUSCULAR | Status: DC
  Filled 2022-07-03: qty 10

## 2022-07-03 MED ORDER — ORAL CARE MOUTH RINSE: 15.0000 mL | OROMUCOSAL | Status: DC | PRN

## 2022-07-03 MED ORDER — ACETAMINOPHEN 325 MG PO TABS
650.0000 mg | ORAL_TABLET | ORAL | Status: DC | PRN
  Administered 2022-07-05 – 2022-07-06 (×2): 650 mg via ORAL
  Filled 2022-07-03 (×3): qty 2

## 2022-07-03 MED ORDER — NIMODIPINE 30 MG PO CAPS
60.0000 mg | ORAL_CAPSULE | ORAL | Status: DC
  Administered 2022-07-03 – 2022-07-04 (×7): 60 mg via ORAL
  Filled 2022-07-03 (×7): qty 2

## 2022-07-03 MED ORDER — ACETAMINOPHEN 160 MG/5ML PO SOLN: 650.0000 mg | ORAL | Status: DC | PRN

## 2022-07-03 MED ORDER — PROPOFOL 500 MG/50ML IV EMUL
INTRAVENOUS | Status: DC | PRN
  Administered 2022-07-03: 75 ug/kg/min via INTRAVENOUS

## 2022-07-03 MED ORDER — LIDOCAINE 2% (20 MG/ML) 5 ML SYRINGE
INTRAMUSCULAR | Status: DC | PRN
  Administered 2022-07-03: 40 mg via INTRAVENOUS

## 2022-07-03 MED ORDER — LEVETIRACETAM 500 MG PO TABS
500.0000 mg | ORAL_TABLET | Freq: Two times a day (BID) | ORAL | Status: AC
  Administered 2022-07-03 – 2022-07-05 (×6): 500 mg via ORAL
  Filled 2022-07-03 (×6): qty 1

## 2022-07-03 MED ORDER — GADOBUTROL 1 MMOL/ML IV SOLN
10.0000 mL | Freq: Once | INTRAVENOUS | Status: AC | PRN
  Administered 2022-07-03: 10 mL via INTRAVENOUS

## 2022-07-03 MED ORDER — HYDROCODONE-ACETAMINOPHEN 5-325 MG PO TABS
1.0000 | ORAL_TABLET | ORAL | Status: DC | PRN
  Administered 2022-07-03 – 2022-07-07 (×16): 1 via ORAL
  Filled 2022-07-03 (×16): qty 1

## 2022-07-03 MED ORDER — TRAMADOL HCL 50 MG PO TABS
50.0000 mg | ORAL_TABLET | Freq: Four times a day (QID) | ORAL | Status: DC | PRN
  Administered 2022-07-03 – 2022-07-08 (×14): 100 mg via ORAL
  Filled 2022-07-03 (×14): qty 2

## 2022-07-03 MED ORDER — LIDOCAINE HCL 1 % IJ SOLN
INTRAMUSCULAR | Status: AC
  Filled 2022-07-03: qty 20

## 2022-07-03 MED ORDER — SODIUM CHLORIDE 0.9 % IV SOLN: INTRAVENOUS | Status: DC

## 2022-07-03 MED ORDER — ONDANSETRON 4 MG PO TBDP: 4.0000 mg | ORAL_TABLET | Freq: Four times a day (QID) | ORAL | Status: DC | PRN

## 2022-07-03 MED ORDER — FENTANYL CITRATE (PF) 100 MCG/2ML IJ SOLN
INTRAMUSCULAR | Status: AC
  Filled 2022-07-03: qty 2

## 2022-07-03 MED ORDER — TAMOXIFEN CITRATE 10 MG PO TABS
20.0000 mg | ORAL_TABLET | Freq: Every day | ORAL | Status: DC
  Administered 2022-07-03 – 2022-07-08 (×6): 20 mg via ORAL
  Filled 2022-07-03 (×6): qty 2

## 2022-07-03 MED ORDER — NIMODIPINE 6 MG/ML PO SOLN: 60.0000 mg | ORAL | Status: DC

## 2022-07-03 MED ORDER — ACETAMINOPHEN 650 MG RE SUPP: 650.0000 mg | RECTAL | Status: DC | PRN

## 2022-07-03 MED ORDER — CHLORHEXIDINE GLUCONATE 0.12 % MT SOLN
15.0000 mL | Freq: Once | OROMUCOSAL | Status: AC
  Administered 2022-07-03: 15 mL via OROMUCOSAL
  Filled 2022-07-03: qty 15

## 2022-07-03 NOTE — ED Notes (Signed)
Stroke swallow screen was performed at 22:40 hours on 07/02/2022

## 2022-07-03 NOTE — Progress Notes (Signed)
  NEUROSURGERY PROGRESS NOTE   No issues overnight. History reviewed with Dr. Marcello Moores and pt at bedside. Briefly, The patient reports sudden onset of lightning type headache starting while she was at [facility] watching a show yesterday. Headache was so severe that she required medical attention and came to the emergency department.  Upon questioning, she does not report any associated blurry or double vision.  No new numbness tingling or weakness of the extremities.  Of note, the patient does not report any history of hypertension or diabetes.  No history of heart attack or stroke.  No known lung, liver, kidney disease.  She is not on any blood thinners or antiplatelet agents.  She is a nonsmoker.  There is no family history of intracranial aneurysms or unexplained intracranial hemorrhage.  EXAM:  BP 124/73   Pulse 85   Temp 98.6 F (37 C) (Oral)   Resp 12   Ht 5\' 4"  (1.626 m)   Wt 106.8 kg   SpO2 99%   BMI 40.42 kg/m   Awake, alert, oriented  Speech fluent, appropriate  CN grossly intact  5/5 BUE/BLE   IMAGING: Noncontrast CT scan of the head was personally reviewed and demonstrates trace amount of subarachnoid hemorrhage in the ambient cistern and the left greater than right sylvian fissure.  No hydrocephalus is noted.  CT angiogram also personally reviewed.  There is some attenuation of the major intracranial vessels including bilateral carotid arteries, vertebral arteries, and basilar artery.  No discrete saccular aneurysm is identified.  There is noticeable opacification of the intracranial dural venous sinuses including bilateral cavernous sinus, superior sagittal sinus, inferior sagittal sinus, and the Galenic system likely related to contrast timing during CT scan, although the presence of a fistula cannot be excluded.  I do not see any evidence of an AVM nidus.  IMPRESSION:  47 y.o. female presenting with low-grade SAH, initial CTA negative for aneurysm. With her young age  and classic history, I think further workup with diagnostic cerebral angiogram is reasonable.  PLAN: - Cont supportive care in ICU - Cont SBP control - Cont Nimotop - Will plan on diagnostic angiogram later this pm   I have reviewed the imaging findings with the patient and her daughter at bedside.  We have discussed my recommendation for further workup with diagnostic cerebral angiogram.  I reviewed with them the details of the procedure and the associated risks.  We also discussed the expected post-procedure course.  All the questions this morning were answered and the patient provided verbal consent to proceed as above.  Consuella Lose, MD [facility] [place]     Consuella Lose, MD [facility]

## 2022-07-03 NOTE — Anesthesia Procedure Notes (Signed)
Procedure Name: MAC Date/Time: 07/03/2022 5:00 PM  Performed by: Reece Agar, CRNAPre-anesthesia Checklist: Patient identified, Emergency Drugs available, Suction available and Patient being monitored Patient Re-evaluated:Patient Re-evaluated prior to induction Oxygen Delivery Method: Simple face mask Preoxygenation: Pre-oxygenation with 100% oxygen

## 2022-07-03 NOTE — ED Notes (Signed)
[facility] called for transport. 

## 2022-07-03 NOTE — Transfer of Care (Signed)
Immediate Anesthesia Transfer of Care Note  Patient: Connie Underwood  Procedure(s) Performed: IR WITH ANESTHESIA  Patient Location: ICU  Anesthesia Type:MAC  Level of Consciousness: awake and alert   Airway & Oxygen Therapy: Patient Spontanous Breathing  Post-op Assessment: Report given to RN and Post -op Vital signs reviewed and stable  Post vital signs: Reviewed and stable  Last Vitals:  Vitals Value Taken Time  BP 100/81 07/03/22 1835  Temp    Pulse 74 07/03/22 1835  Resp 13 07/03/22 1835  SpO2 98 % 07/03/22 1835  Vitals shown include unvalidated device data.  Last Pain:  Vitals:   07/03/22 1544  TempSrc: Oral  PainSc:          Complications: No notable events documented.

## 2022-07-03 NOTE — Progress Notes (Signed)
Cara,floor RN called report stating patient last ate grapes and scrambled eggs at 1030am. Anesthesia made aware and delayed surgery until 1830. Cara RN and OR made aware.

## 2022-07-03 NOTE — ED Notes (Signed)
ED TO INPATIENT HANDOFF REPORT  ED Nurse Name and Phone #: Leo Rod, [place] Paramedic [phone]  S Name/Age/Gender [name] 47 y.o. female Room/Bed: MH09/MH09  Code Status   Code Status: Prior  Home/SNF/Other Home Patient oriented to: self, place, time, and situation Is this baseline? Yes   Triage Complete: Triage complete  Chief Complaint vomiting  Triage Note Pt via [facility] for eval of NV, s/p "episode" at [facility]. Pt states she "started having a hot flash" followed by NVD. Symptoms resolved upon arrival to ED. Pt states she has "horrible HA," endorses hx migraines "but this is worse." Pt disoriented, states she feels she is going to pass out.   VSS, 18RAC w 4mg  zofran PTA   Allergies Allergies  Allergen Reactions   Tomato     Hives   Pollen Extract Other (See Comments)    Seasonal allergies     Level of Care/Admitting Diagnosis ED Disposition     ED Disposition  Admit   Condition  --   Comment  The patient appears reasonably stabilized for admission considering the current resources, flow, and capabilities available in the ED at this time, and I doubt any other [facility] requiring further screening and/or treatment in the ED prior to admission is  present.          B Medical/Surgery History Past Medical History:  Diagnosis Date   Anemia    Anxiety    Breast cancer ([place])    right   Depression    Family history of prostate cancer 10/18/2020   Headache    migraines   Hx of migraines    Pre-diabetes    Seasonal allergic reaction    Past Surgical History:  Procedure Laterality Date   BREAST RECONSTRUCTION WITH PLACEMENT OF TISSUE EXPANDER AND FLEX HD (ACELLULAR HYDRATED DERMIS) Right 12/25/2020   Procedure: RIGHT BREAST RECONSTRUCTION WITH PLACEMENT OF TISSUE EXPANDER AND FLEX HD (ACELLULAR HYDRATED DERMIS);  Surgeon: Cindra Presume, MD;  Location: [place];  Service: Plastics;  Laterality: Right;   LIPOSUCTION WITH LIPOFILLING Right  12/11/2021   Procedure: Fat grafting to right breast from abdomen;  Surgeon: [name], MD;  Location: [place];  Service: Plastics;  Laterality: Right;   MASTECTOMY W/ SENTINEL NODE BIOPSY Right 12/25/2020   Procedure: RIGHT MASTECTOMY WITH RIGHT AXILLARY SENTINEL LYMPH NODE BIOPSY;  Surgeon: Rolm Bookbinder, MD;  Location: [place];  Service: General;  Laterality: Right;   MASTOPEXY Left 03/12/2021   Procedure: LEFT MASTOPEXY;  Surgeon: Cindra Presume, MD;  Location: [place];  Service: Plastics;  Laterality: Left;   REMOVAL OF BILATERAL TISSUE EXPANDERS WITH PLACEMENT OF BILATERAL BREAST IMPLANTS Right 03/12/2021   Procedure: REMOVAL OF RIGHT TISSUE EXPANDERS WITH PLACEMENT OF RIGHT  BREAST IMPLANTS;  Surgeon: Cindra Presume, MD;  Location: [place];  Service: Plastics;  Laterality: Right;   TUBAL LIGATION       A IV Location/Drains/Wounds Patient Lines/Drains/Airways Status     Active Line/Drains/Airways     Name Placement date Placement time Site Days   Peripheral IV 07/02/22 18 G Right Antecubital 07/02/22  --  Antecubital  1   Closed System Drain 1 Right;Lateral Breast Bulb (JP) 15 Fr. 12/25/20  0935  Breast  555   External Urinary Catheter 07/03/22  0026  --  less than 1   Incision (Closed) 12/25/20 Breast Right 12/25/20  1014  -- 555   Incision (Closed) 03/12/21 Breast Left 03/12/21  0928  -- 478  Incision (Closed) 12/11/21 Abdomen Other (Comment) 12/11/21  1304  -- 204   Incision (Closed) 12/11/21 Breast Right 12/11/21  1304  -- 204            Intake/Output Last 24 hours  Intake/Output Summary (Last 24 hours) at 07/03/2022 0036 Last data filed at 07/03/2022 0027 Gross per 24 hour  Intake --  Output 500 ml  Net -500 ml    Labs/Imaging Results for orders placed or performed during the hospital encounter of 07/02/22 (from the past 48 hour(s))  Basic metabolic panel     Status: Abnormal   Collection Time: 07/02/22  9:58 PM  Result Value Ref Range    Sodium 135 135 - 145 mmol/L   Potassium 3.2 (L) 3.5 - 5.1 mmol/L   Chloride 105 98 - 111 mmol/L   CO2 23 22 - 32 mmol/L   Glucose, Bld 129 (H) 70 - 99 mg/dL    Comment: Glucose reference range applies only to samples taken after fasting for at least 8 hours.   BUN 10 6 - 20 mg/dL   Creatinine, Ser 0.77 0.44 - 1.00 mg/dL   Calcium 8.0 (L) 8.9 - 10.3 mg/dL   GFR, Estimated >60 >60 mL/min    Comment: (NOTE) Calculated using the CKD-EPI Creatinine Equation (2021)    Anion gap 7 5 - 15    Comment: Performed at [facility], [address] [place], [place] [address]  CBC with Differential     Status: Abnormal   Collection Time: 07/02/22  9:58 PM  Result Value Ref Range   WBC 11.8 (H) 4.0 - 10.5 K/uL   RBC 3.69 (L) 3.87 - 5.11 MIL/uL   Hemoglobin 9.5 (L) 12.0 - 15.0 g/dL   HCT 30.6 (L) 36.0 - 46.0 %   MCV 82.9 80.0 - 100.0 fL   MCH 25.7 (L) 26.0 - 34.0 pg   MCHC 31.0 30.0 - 36.0 g/dL   RDW 18.1 (H) 11.5 - 15.5 %   Platelets 381 150 - 400 K/uL   nRBC 0.0 0.0 - 0.2 %   Neutrophils Relative % 75 %   Neutro Abs 8.8 (H) 1.7 - 7.7 K/uL   Lymphocytes Relative 18 %   Lymphs Abs 2.1 0.7 - 4.0 K/uL   Monocytes Relative 5 %   Monocytes Absolute 0.6 0.1 - 1.0 K/uL   Eosinophils Relative 1 %   Eosinophils Absolute 0.2 0.0 - 0.5 K/uL   Basophils Relative 0 %   Basophils Absolute 0.0 0.0 - 0.1 K/uL   Immature Granulocytes 1 %   Abs Immature Granulocytes 0.06 0.00 - 0.07 K/uL    Comment: Performed at [facility], [address] [place], [place] [address]   CT Angio Head W or Wo Contrast  Result Date: 07/02/2022 CLINICAL DATA:  Subarachnoid hemorrhage on CT head EXAM: CT ANGIOGRAPHY HEAD TECHNIQUE: Multidetector CT imaging of the head was performed using the standard protocol during bolus administration of intravenous contrast. Multiplanar CT image reconstructions and MIPs were obtained to evaluate the vascular anatomy. RADIATION DOSE REDUCTION: This exam was performed  according to the departmental dose-optimization program which includes automated exposure control, adjustment of the mA and/or kV according to patient size and/or use of iterative reconstruction technique. CONTRAST:  66mL OMNIPAQUE IOHEXOL 350 MG/ML SOLN COMPARISON:  No prior CTA available, correlation is made with CT head 07/02/2022 FINDINGS: Anterior circulation: Both internal carotid arteries are patent to the termini, without significant stenosis. A1 segments patent,  hypoplastic on the right. Normal anterior communicating artery. Anterior cerebral arteries are patent to their distal aspects without significant stenosis. No M1 stenosis or occlusion. MCA branches perfused to their distal aspects without significant stenosis. Posterior circulation: Vertebral arteries patent to the vertebrobasilar junction without significant stenosis. Posterior inferior cerebellar arteries patent proximally. Basilar patent to its distal aspect without significant stenosis. Superior cerebellar arteries patent proximally. Patent P1 segments. PCAs perfused to their distal aspects without significant stenosis. The bilateral posterior communicating arteries are not visualized. Venous sinuses: As permitted by contrast timing, patent. Anatomic variants: None significant. Review of the MIP images confirms the above findings No evidence of aneurysm or vascular malformation. IMPRESSION: 1. No intracranial aneurysm identified. 2. No large vessel occlusion or hemodynamically significant stenosis. Electronically Signed   By: Merilyn Baba M.D.   On: 07/02/2022 22:48   CT Head Wo Contrast  Result Date: 07/02/2022 CLINICAL DATA:  Headache, sudden, severe EXAM: CT HEAD WITHOUT CONTRAST TECHNIQUE: Contiguous axial images were obtained from the base of the skull through the vertex without intravenous contrast. RADIATION DOSE REDUCTION: This exam was performed according to the departmental dose-optimization program which includes automated  exposure control, adjustment of the mA and/or kV according to patient size and/or use of iterative reconstruction technique. COMPARISON:  07/13/2019 FINDINGS: Brain: There is acute mild subarachnoid hemorrhage within the left sylvian fissure and, to a lesser extent, the suprasellar cistern. No associated abnormal mass effect or midline shift. No abnormal intra or extra-axial mass lesion. No evidence of acute cortical infarct. Ventricular size is normal. Cerebellum is unremarkable. Vascular: No asymmetric hyperdense vasculature at the skull base. Skull: Normal. Negative for fracture or focal lesion. Sinuses/Orbits: No acute finding. Other: Mastoid air cells and middle ear cavities are clear. IMPRESSION: 1. Acute mild subarachnoid hemorrhage within the left Sylvian fissure and, to a lesser extent, the suprasellar cistern. No associated abnormal mass effect or midline shift. CT arteriography is recommended for further evaluation. Electronically Signed   By: Fidela Salisbury M.D.   On: 07/02/2022 21:46    Pending Labs Unresulted Labs (From admission, onward)     Start     Ordered   07/03/22 0010  Protime-INR  Once,   STAT        07/03/22 0010   Signed and Held  HIV Antibody (routine testing w rflx)  (HIV Antibody (Routine testing w reflex) panel)  Once,   R        Signed and Held   Signed and Held  CBC  Once,   R        Signed and Held   Signed and Held  Protime-INR  Once,   R        Signed and Held   Signed and Held  APTT  Once,   R        Signed and Held   Signed and Held  Urine rapid drug screen (hosp performed)  Once,   R        Signed and Held            Vitals/Pain Today's Vitals   07/02/22 2117 07/02/22 2253 07/02/22 2359 07/03/22 0000  BP: 121/72   122/80  Pulse: 83   71  Resp: 20   (!) 21  Temp: 98 F (36.7 C)     SpO2: 96%   95%  PainSc:  8  8      Isolation Precautions No active isolations  Medications Medications  lactated ringers infusion ( Intravenous  New Bag/Given  07/02/22 2202)  iohexol (OMNIPAQUE) 350 MG/ML injection 75 mL (75 mLs Intravenous Contrast Given 07/02/22 2228)  HYDROmorphone (DILAUDID) injection 1 mg (1 mg Intravenous Given 07/02/22 2220)  ondansetron (ZOFRAN) injection 4 mg (4 mg Intravenous Given 07/02/22 2220)    Mobility walks     Focused Assessments Neuro Assessment Handoff:  Swallow screen pass? Yes    NIH Stroke Scale  Dizziness Present: No Headache Present: Yes Interval: Shift assessment Level of Consciousness (1a.)   : Alert, keenly responsive LOC Questions (1b. )   : Answers both questions correctly LOC Commands (1c. )   : Performs both tasks correctly Best Gaze (2. )  : Normal Visual (3. )  : No visual loss Facial Palsy (4. )    : Normal symmetrical movements Motor Arm, Left (5a. )   : No drift Motor Arm, Right (5b. ) : No drift Motor Leg, Left (6a. )  : No drift Motor Leg, Right (6b. ) : No drift Limb Ataxia (7. ): Absent Sensory (8. )  : Normal, no sensory loss Best Language (9. )  : No aphasia Dysarthria (10. ): Normal Extinction/Inattention (11.)   : No Abnormality Complete NIHSS TOTAL: 0     Neuro Assessment: Within Defined Limits Neuro Checks:   Shift assessment (07/03/22 0015)  Has TPA been given? No If patient is a Neuro Trauma and patient is going to OR before floor call report to [place] nurse: [phone] or [phone]   R Recommendations: See Admitting Provider Note  Report given to:   Additional Notes:

## 2022-07-03 NOTE — Anesthesia Preprocedure Evaluation (Signed)
Anesthesia Evaluation  Patient identified by MRN, date of birth, ID band Patient awake    Reviewed: Allergy & Precautions, NPO status , Patient's Chart, lab work & pertinent test results  Airway Mallampati: II  TM Distance: >3 FB Neck ROM: Full    Dental  (+) Dental Advisory Given   Pulmonary neg pulmonary ROS   breath sounds clear to auscultation       Cardiovascular negative cardio ROS  Rhythm:Regular Rate:Normal     Neuro/Psych  Headaches SAH    GI/Hepatic negative GI ROS, Neg liver ROS,,,  Endo/Other  negative endocrine ROS    Renal/GU negative Renal ROS     Musculoskeletal   Abdominal   Peds  Hematology  (+) Blood dyscrasia, anemia   Anesthesia Other Findings   Reproductive/Obstetrics                             Anesthesia Physical Anesthesia Plan  ASA: 3  Anesthesia Plan: MAC   Post-op Pain Management:    Induction:   PONV Risk Score and Plan: 2 and Propofol infusion, Ondansetron and Treatment may vary due to age or medical condition  Airway Management Planned: Simple Face Mask and Natural Airway  Additional Equipment:   Intra-op Plan:   Post-operative Plan:   Informed Consent: I have reviewed the patients History and Physical, chart, labs and discussed the procedure including the risks, benefits and alternatives for the proposed anesthesia with the patient or authorized representative who has indicated his/her understanding and acceptance.       Plan Discussed with: CRNA  Anesthesia Plan Comments:        Anesthesia Quick Evaluation

## 2022-07-03 NOTE — Progress Notes (Signed)
Transcranial Doppler  Date POD PCO2 HCT BP  MCA ACA PCA OPHT SIPH VERT Basilar  4/3 RH 0  29.9 102/ 74 Right  Left   40  46   -40  -34   41  22   10  11    44  48   -45  -36   -43           Right  Left                                            Right  Left                                             Right  Left                                             Right  Left                                            Right  Left                                            Right  Left                                        MCA = Middle Cerebral Artery      OPHT = Opthalmic Artery     BASILAR = Basilar Artery   ACA = Anterior Cerebral Artery     SIPH = Carotid Siphon PCA = Posterior Cerebral Artery   VERT = Verterbral Artery                   Normal MCA = 62+\-12 ACA = 50+\-12 PCA = 42+\-23   *= Unable to insonate  RT [place] = 1.67  LT [place] = 1.77  Darlin Coco, RDMS, RVT 4:00 PM 07/03/22

## 2022-07-03 NOTE — Anesthesia Postprocedure Evaluation (Signed)
Anesthesia Post Note  Patient: Connie Underwood  Procedure(s) Performed: IR WITH ANESTHESIA     Patient location during evaluation: ICU Anesthesia Type: MAC Level of consciousness: awake Pain management: pain level controlled Vital Signs Assessment: post-procedure vital signs reviewed and stable Respiratory status: spontaneous breathing, nonlabored ventilation and respiratory function stable Cardiovascular status: blood pressure returned to baseline and stable Postop Assessment: no apparent nausea or vomiting Anesthetic complications: no   No notable events documented.  Last Vitals:  Vitals:   07/03/22 1930 07/03/22 2000  BP: 122/76 130/85  Pulse: 71 72  Resp: 10 10  Temp:  36.9 C  SpO2: 98% 98%    Last Pain:  Vitals:   07/03/22 2000  TempSrc: Oral  PainSc: 0-No pain                 Newman Waren P Yancy Knoble

## 2022-07-03 NOTE — Evaluation (Signed)
Physical Therapy Evaluation Patient Details Name: Connie Underwood MRN: [id number] DOB: 05/08/1975 Today's Date: 07/03/2022  History of Present Illness  Patient is a 47 y/o female admitted 07/02/22 with headache, nausea and hot flash.  Found to have [place].  PMH positive for breast CA s/p B mastectomy and reconstruction, Migraines, depression, pre-diabetes.  Clinical Impression  Patient presents with mobility limited due to headache, nausea, dizziness.  She was able to walk to the bathroom though did not feel well afterward and returned to supine.  She needed only assistance for lines and safety so doubt she will need follow up PT at d/c, though will follow in acute setting to ensure mobilizing well with symptoms improved.  Will have support from daughter and son as needed at d/c.        Recommendations for follow up therapy are one component of a multi-disciplinary discharge planning process, led by the attending physician.  Recommendations may be updated based on patient status, additional functional criteria and insurance authorization.  Follow Up Recommendations       Assistance Recommended at Discharge Intermittent Supervision/Assistance  Patient can return home with the following  Assist for transportation;Assistance with cooking/housework;Help with stairs or ramp for entrance    Equipment Recommendations None recommended by PT  Recommendations for Other Services       Functional Status Assessment Patient has had a recent decline in their functional status and demonstrates the ability to make significant improvements in function in a reasonable and predictable amount of time.     Precautions / Restrictions Precautions Precautions: None      Mobility  Bed Mobility Overal bed mobility: Modified Independent                  Transfers Overall transfer level: Needs assistance   Transfers: Sit to/from Stand Sit to Stand: Min guard           General transfer  comment: assist for safety and lines    Ambulation/Gait Ambulation/Gait assistance: Min guard Gait Distance (Feet): 12 Feet (x 2) Assistive device: None, 1 person hand held assist Gait Pattern/deviations: Step-through pattern, Decreased stride length       General Gait Details: minguard for safety to bathroom then pt walking back to EOB with daughter as PT setting up chair and pt up washing hands in bathroom; noted feeling nauseated, hot and dizzy; BP WNL, pt preferred back to bed.  Stairs            Wheelchair Mobility    Modified Rankin (Stroke Patients Only) Modified Rankin (Stroke Patients Only) Pre-Morbid Rankin Score: No symptoms Modified Rankin: Moderate disability     Balance Overall balance assessment: No apparent balance deficits (not formally assessed)                                           Pertinent Vitals/Pain Pain Assessment Pain Assessment: 0-10 Pain Score: 4  Pain Location: headache Pain Descriptors / Indicators: Headache Pain Intervention(s): Monitored during session, RN gave pain meds during session    Home Living Family/patient expects to be discharged to:: Private residence Living Arrangements: Children Available Help at Discharge: Family Type of Home: Apartment Home Access: Level entry           Additional Comments: currently between residences, had an apartment worked out but fell through, but will be moving into apartment    Prior  Function Prior Level of Function : Independent/Modified Independent             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, newspaper        Extremity/Trunk Assessment   Upper Extremity Assessment Upper Extremity Assessment: Overall WFL for tasks assessed    Lower Extremity Assessment Lower Extremity Assessment: Overall WFL for tasks assessed       Communication   Communication: No difficulties  Cognition Arousal/Alertness: Awake/alert Behavior During  Therapy: WFL for tasks assessed/performed Overall Cognitive Status: Within Functional Limits for tasks assessed                                          General Comments General comments (skin integrity, edema, etc.): daughter entered during session and son in the room; VSS though RN aware pt with symptoms and provided cool cloth and fan with improved symptoms once supine    Exercises     Assessment/Plan    PT Assessment Patient needs continued PT services  PT Problem List Decreased activity tolerance;Decreased mobility;Pain       PT Treatment Interventions DME instruction;Functional mobility training;Balance training;Patient/family education;Gait training;Therapeutic activities    PT Goals (Current goals can be found in the Care Plan section)  Acute Rehab PT Goals Patient Stated Goal: to return to independent PT Goal Formulation: With patient/family Time For Goal Achievement: 07/17/22 Potential to Achieve Goals: Good    Frequency Min 3X/week     Co-evaluation               AM-PAC PT "6 Clicks" Mobility  Outcome Measure Help needed turning from your back to your side while in a flat bed without using bedrails?: A Little Help needed moving from lying on your back to sitting on the side of a flat bed without using bedrails?: A Little Help needed moving to and from a bed to a chair (including a wheelchair)?: A Little Help needed standing up from a chair using your arms (e.g., wheelchair or bedside chair)?: A Little Help needed to walk in hospital room?: Total Help needed climbing 3-5 steps with a railing? : Total 6 Click Score: 14    End of Session Equipment Utilized During Treatment: Gait belt Activity Tolerance: Patient limited by fatigue;Treatment limited secondary to medical complications (Comment) (dizziness, nausea) Patient left: in bed;with call bell/phone within reach;with family/visitor present   PT Visit Diagnosis: Muscle weakness  (generalized) (M62.81);Difficulty in walking, not elsewhere classified (R26.2)    Time: 1440-1505 PT Time Calculation (min) (ACUTE ONLY): 25 min   Charges:   PT Evaluation $PT Eval Moderate Complexity: 1 Mod PT Treatments $Therapeutic Activity: 8-22 mins        Magda Kiel, PT Acute Rehabilitation Services Office:[phone] 07/03/2022   Reginia Naas 07/03/2022, 5:12 PM

## 2022-07-03 NOTE — Op Note (Signed)
  NEUROSURGERY BRIEF OPERATIVE  NOTE   PREOP DX: Subarachnoid Hemorrhage  POSTOP DX: Same  PROCEDURE: Diagnostic cerebral angiogram  SURGEON: Dr. Consuella Lose, MD  ANESTHESIA: IV Sedation with Local  APPROACH: Right trans-femoral  EBL: Minimal  SPECIMENS: None  COMPLICATIONS: None  CONDITION: Stable to ICU  FINDINGS (Full report in [facility]): 1. Normal angiogram, no aneurysm, AVM, or fistula seen.   Consuella Lose, MD [facility]

## 2022-07-03 NOTE — H&P (Signed)
CC: SAH  HPI:     Patient is a 47 y.o. female presents with CC of horrible HA onset last night while at a concert. Ass'd w/ nausea and hot flash. She went to ED and underwent CT which showed SAH. CTA head negative. She was transferred to [facility] overnight. No add'l complaints. Denies PMH HTN.    Patient Active Problem List   Diagnosis Date Noted   SAH (subarachnoid hemorrhage) 07/03/2022   Genetic testing 10/25/2020   Family history of prostate cancer 10/18/2020   Iron deficiency anemia due to chronic blood loss 10/18/2020   Malignant neoplasm of upper-inner quadrant of right breast in female, estrogen receptor positive 10/13/2020   Past Medical History:  Diagnosis Date   Anemia    Anxiety    Breast cancer ([place])    right   Depression    Family history of prostate cancer 10/18/2020   Headache    migraines   Hx of migraines    Pre-diabetes    Seasonal allergic reaction     Past Surgical History:  Procedure Laterality Date   BREAST RECONSTRUCTION WITH PLACEMENT OF TISSUE EXPANDER AND FLEX HD (ACELLULAR HYDRATED DERMIS) Right 12/25/2020   Procedure: RIGHT BREAST RECONSTRUCTION WITH PLACEMENT OF TISSUE EXPANDER AND FLEX HD (ACELLULAR HYDRATED DERMIS);  Surgeon: Cindra Presume, MD;  Location: [place];  Service: Plastics;  Laterality: Right;   LIPOSUCTION WITH LIPOFILLING Right 12/11/2021   Procedure: Fat grafting to right breast from abdomen;  Surgeon: [name], MD;  Location: [place];  Service: Plastics;  Laterality: Right;   MASTECTOMY W/ SENTINEL NODE BIOPSY Right 12/25/2020   Procedure: RIGHT MASTECTOMY WITH RIGHT AXILLARY SENTINEL LYMPH NODE BIOPSY;  Surgeon: Rolm Bookbinder, MD;  Location: [place];  Service: General;  Laterality: Right;   MASTOPEXY Left 03/12/2021   Procedure: LEFT MASTOPEXY;  Surgeon: Cindra Presume, MD;  Location: [place];  Service: Plastics;  Laterality: Left;   REMOVAL OF BILATERAL TISSUE EXPANDERS WITH PLACEMENT OF BILATERAL BREAST  IMPLANTS Right 03/12/2021   Procedure: REMOVAL OF RIGHT TISSUE EXPANDERS WITH PLACEMENT OF RIGHT  BREAST IMPLANTS;  Surgeon: Cindra Presume, MD;  Location: [place];  Service: Plastics;  Laterality: Right;   TUBAL LIGATION      Medications Prior to Admission  Medication Sig Dispense Refill Last Dose   EMGALITY 120 MG/ML SOAJ Inject 1 mL into the skin every 30 (thirty) days.      oxyCODONE-acetaminophen (PERCOCET/ROXICET) 5-325 MG tablet Take 1 tablet by mouth every 6 (six) hours as needed for severe pain. 6 tablet 0    SUMAtriptan (IMITREX) 50 MG tablet Take 50 mg by mouth every 2 (two) hours as needed for migraine.      tamoxifen (NOLVADEX) 20 MG tablet TAKE 1 TABLET BY MOUTH EVERY DAY 90 tablet 1    Vitamin D, Ergocalciferol, (DRISDOL) 1.25 MG (50000 UNIT) CAPS capsule Take 50,000 Units by mouth once a week.      Allergies  Allergen Reactions   Tomato     Hives   Pollen Extract Other (See Comments)    Seasonal allergies     Social History   Tobacco Use   Smoking status: Never   Smokeless tobacco: Never  Substance Use Topics   Alcohol use: No    Family History  Problem Relation Age of Onset   Prostate cancer Maternal Uncle        dx after 3   Cancer Maternal Uncle  unknown type dx after 50; prostate? lung?   Prostate cancer Paternal Uncle        3 paternal uncles with prostate cancer dx after 65   Prostate cancer Paternal Grandfather        dx after 72     Review of Systems Pertinent items are noted in HPI.  Objective:   Patient Vitals for the past 8 hrs:  BP Temp Temp src Pulse Resp SpO2 Height Weight  07/03/22 0800 -- 99.5 F (37.5 C) Axillary -- -- -- -- --  07/03/22 0700 131/67 -- -- 80 12 99 % -- --  07/03/22 0600 125/66 -- -- 79 (!) 21 98 % -- --  07/03/22 0500 131/71 -- -- 84 20 98 % -- --  07/03/22 0400 127/71 99.2 F (37.3 C) Oral 76 (!) 23 97 % -- --  07/03/22 0300 114/72 -- -- 70 19 99 % -- --  07/03/22 0200 128/73 -- -- 73 20 98 % -- --   07/03/22 0146 114/77 98.7 F (37.1 C) Oral 76 13 97 % 5\' 4"  (1.626 m) 106.8 kg   I/O last 3 completed shifts: In: 771.6 [I.V.:771.6] Out: 900 [Urine:900] No intake/output data recorded.    BP 131/67   Pulse 80   Temp 99.5 F (37.5 C) (Axillary)   Resp 12   Ht 5\' 4"  (1.626 m)   Wt 106.8 kg   SpO2 99%   BMI 40.42 kg/m  General appearance: alert, cooperative, and appears stated age Head: Normocephalic, without obvious abnormality, atraumatic Eyes: negative findings: pupils equal, round, reactive to light and accomodation Extremities: extremities normal, atraumatic, no cyanosis or edema Skin: Skin color, texture, turgor normal. No rashes or lesions Neurologic: Alert and oriented X 3, normal strength and tone. Normal symmetric reflexes. Normal coordination and gait Mental status: Alert, oriented, thought content appropriate Sensory: normal Motor: grossly normal Coordination: normal  Data ReviewCBC:  Lab Results  Component Value Date   WBC 13.3 (H) 07/03/2022   RBC 3.67 (L) 07/03/2022   BMP:  Lab Results  Component Value Date   GLUCOSE 129 (H) 07/02/2022   CO2 23 07/02/2022   BUN 10 07/02/2022   CREATININE 0.77 07/02/2022   CREATININE 0.76 10/18/2020   CALCIUM 8.0 (L) 07/02/2022   Coagulation:  Lab Results  Component Value Date   INR 1.1 07/03/2022   APTT 25 07/03/2022    Narrative & Impression  CLINICAL DATA:  47 year old female with worst headache of life, accompanied by hot flash, nausea vomiting diarrhea. Subarachnoid hemorrhage, CTA negative for intracranial aneurysm.   EXAM: CT HEAD WITHOUT CONTRAST   TECHNIQUE: Contiguous axial images were obtained from the base of the skull through the vertex without intravenous contrast.   RADIATION DOSE REDUCTION: This exam was performed according to the departmental dose-optimization program which includes automated exposure control, adjustment of the mA and/or kV according to patient size and/or use of  iterative reconstruction technique.   COMPARISON:  CT head and CTA head yesterday.   FINDINGS: Brain: No intraventricular hemorrhage or ventriculomegaly. No midline shift or significant intracranial mass effect.   Subtle hyperdense subarachnoid hemorrhage now more apparent along the left anterior frontal convexity (series 2, image 16) and less apparent in the sylvian fissure on that side. However, there is evidence of small volume blood in the suprasellar and interpeduncular cisterns.   No intra-axial or other intracranial hemorrhage identified. Gray-white differentiation is stable and within normal limits. No cortically based acute infarct identified.   Vascular: No  suspicious intracranial vascular hyperdensity.   Skull: Stable and intact.   Sinuses/Orbits: Visualized paranasal sinuses and mastoids are stable and well aerated.   Other: Disconjugate gaze. Visualized scalp soft tissues are within normal limits.   IMPRESSION: 1. Ongoing isodense to hyperdense subarachnoid hemorrhage, basilar cistern location and now partially redistributed along the left anterior frontal convexity. Small volume SAH overall. No IVH, ventriculomegaly, or other complicating features. 2. Stable gray-white differentiation and no new intracranial abnormality.   Assessment:   Principal Problem:   SAH (subarachnoid hemorrhage)   Plan:   -Continue supportive care -Will order MRI w/ and w/o contrast for further evaluation. Repeat CT head stable this AM.  -Keppra 500mg  BID x3days.  -Call w/ questions/concerns.   Caroline More, [facility]

## 2022-07-03 NOTE — Progress Notes (Signed)
OT Cancellation Note  Patient Details Name: Connie Underwood MRN: [id number] DOB: 1975/07/22   Cancelled Treatment:    Reason Eval/Treat Not Completed: Other (comment) (Pt was working with PT and became hot and nauseous and had to lay back in bed. May be medication related as pt is NPO for a procedure today. Will hold OT evaluation and see patient tomorrow 4/4 to evaluate.)  Ailene Ravel, OTR/L,CBIS  Supplemental OT - [facility] and [facility] Secure Chat Preferred   07/03/2022, 3:08 PM

## 2022-07-04 ENCOUNTER — Encounter (HOSPITAL_COMMUNITY): Payer: Self-pay | Admitting: Neurosurgery

## 2022-07-04 MED ORDER — SODIUM CHLORIDE 0.9 % IV BOLUS
500.0000 mL | Freq: Once | INTRAVENOUS | Status: AC
  Administered 2022-07-04: 500 mL via INTRAVENOUS

## 2022-07-04 MED ORDER — NIMODIPINE 6 MG/ML PO SOLN: 60.0000 mg | ORAL | Status: DC

## 2022-07-04 MED ORDER — NIMODIPINE 30 MG PO CAPS
30.0000 mg | ORAL_CAPSULE | ORAL | Status: DC
  Filled 2022-07-04: qty 1

## 2022-07-04 MED ORDER — NIMODIPINE 6 MG/ML PO SOLN
30.0000 mg | ORAL | Status: DC
  Filled 2022-07-04 (×3): qty 10

## 2022-07-04 MED ORDER — NIMODIPINE 30 MG PO CAPS
30.0000 mg | ORAL_CAPSULE | ORAL | Status: DC
  Administered 2022-07-04 – 2022-07-08 (×49): 30 mg via ORAL
  Filled 2022-07-04 (×41): qty 1

## 2022-07-04 NOTE — Progress Notes (Signed)
SBP now low 100s after fluid bolus, HR 57, proceed with giving the 0400 60mg  nimotop per Dr. Christella Noa. Will continue to monitor.

## 2022-07-04 NOTE — Evaluation (Signed)
Speech Language Pathology Evaluation Patient Details Name: Connie Underwood MRN: [id number] DOB: Dec 15, 1975 Today's Date: 07/04/2022 Time: 1000-1025 SLP Time Calculation (min) (ACUTE ONLY): 25 min  Problem List:  Patient Active Problem List   Diagnosis Date Noted   SAH (subarachnoid hemorrhage) 07/03/2022   Genetic testing 10/25/2020   Family history of prostate cancer 10/18/2020   Iron deficiency anemia due to chronic blood loss 10/18/2020   Malignant neoplasm of upper-inner quadrant of right breast in female, estrogen receptor positive 10/13/2020   Past Medical History:  Past Medical History:  Diagnosis Date   Anemia    Anxiety    Breast cancer    right   Depression    Family history of prostate cancer 10/18/2020   Headache    migraines   Hx of migraines    Pre-diabetes    Seasonal allergic reaction    Past Surgical History:  Past Surgical History:  Procedure Laterality Date   BREAST RECONSTRUCTION WITH PLACEMENT OF TISSUE EXPANDER AND FLEX HD (ACELLULAR HYDRATED DERMIS) Right 12/25/2020   Procedure: RIGHT BREAST RECONSTRUCTION WITH PLACEMENT OF TISSUE EXPANDER AND FLEX HD (ACELLULAR HYDRATED DERMIS);  Surgeon: Cindra Presume, MD;  Location: [place];  Service: Plastics;  Laterality: Right;   IR ANGIO EXTERNAL CAROTID SEL EXT CAROTID BILAT MOD SED  07/03/2022   IR ANGIO INTRA EXTRACRAN SEL INTERNAL CAROTID BILAT MOD SED  07/03/2022   IR ANGIO VERTEBRAL SEL VERTEBRAL UNI L MOD SED  07/03/2022   LIPOSUCTION WITH LIPOFILLING Right 12/11/2021   Procedure: Fat grafting to right breast from abdomen;  Surgeon: [name], MD;  Location: [place];  Service: Plastics;  Laterality: Right;   MASTECTOMY W/ SENTINEL NODE BIOPSY Right 12/25/2020   Procedure: RIGHT MASTECTOMY WITH RIGHT AXILLARY SENTINEL LYMPH NODE BIOPSY;  Surgeon: Rolm Bookbinder, MD;  Location: [place];  Service: General;  Laterality: Right;   MASTOPEXY Left 03/12/2021   Procedure: LEFT MASTOPEXY;   Surgeon: Cindra Presume, MD;  Location: [place];  Service: Plastics;  Laterality: Left;   RADIOLOGY WITH ANESTHESIA N/A 07/03/2022   Procedure: IR WITH ANESTHESIA;  Surgeon: Consuella Lose, MD;  Location: [place];  Service: Radiology;  Laterality: N/A;   REMOVAL OF BILATERAL TISSUE EXPANDERS WITH PLACEMENT OF BILATERAL BREAST IMPLANTS Right 03/12/2021   Procedure: REMOVAL OF RIGHT TISSUE EXPANDERS WITH PLACEMENT OF RIGHT  BREAST IMPLANTS;  Surgeon: Cindra Presume, MD;  Location: [place];  Service: Plastics;  Laterality: Right;   TUBAL LIGATION     HPI:  Patient is a 47 y/o female admitted 07/02/22 with headache, nausea and hot flash.  Found to have Connie Underwood.  PMH positive for breast CA s/p B mastectomy and reconstruction, Migraines, depression, pre-diabetes.   Assessment / Plan / Recommendation Clinical Impression  Pt appears to be at her cognitive-linguistic baseline, with clear and fluent speech. She also demonstrates good attention and recall of recent events with detail. Performance on SLUMS is mildly impaired (26/30, with 27 or above considered to be [facility]), but with half of her points lost coming from her clock drawing task. She showed appropriate organization and planning for drawing the clock, but with good awareness of difficulty drawing hands. Her hands were close to the appropriate places, and she says that even at baseline she would have had difficulty with this. Overall, given that she has no subjective complaints and she believes that she is also at her baseline, no further SLP f/u is indicated at this time.  SLP Assessment  SLP Recommendation/Assessment: Patient does not need any further Speech [place] [facility] SLP Visit Diagnosis: Cognitive communication deficit (R41.841)    Recommendations for follow up therapy are one component of a multi-disciplinary discharge planning process, led by the attending physician.  Recommendations may be updated based on patient status, additional  functional criteria and insurance authorization.    Follow Up Recommendations  No SLP follow up    Assistance Recommended at Discharge  Intermittent Supervision/Assistance (upon initial return home, fading as able - says she lives with her daughter who can do this)   Functional Status Assessment Patient has not had a recent decline in their functional status  Frequency and Duration           SLP Evaluation Cognition  Overall Cognitive Status: Within Functional Limits for tasks assessed       Comprehension  Auditory Comprehension Overall Auditory Comprehension: Appears within functional limits for tasks assessed    Expression Expression Primary Mode of Expression: Verbal Verbal Expression Overall Verbal Expression: Appears within functional limits for tasks assessed Written Expression Dominant Hand: Right   Oral / Motor  Motor Speech Overall Motor Speech: Appears within functional limits for tasks assessed            [name], M.A. [place] Office [phone]  Secure chat preferred  07/04/2022, 12:33 PM

## 2022-07-04 NOTE — Progress Notes (Signed)
  Transition of Care [facility]) Screening Note   Patient Details  Name: Connie Underwood Date of Birth: [birth year]   Transition of Care [facility]) CM/SW Contact:    Benard Halsted, LCSW Phone Number: 07/04/2022, 5:03 PM    Transition of Care Department [facility]) has reviewed patient. We will continue to monitor patient advancement through interdisciplinary progression rounds. If new patient transition needs arise, please place a TOC consult.

## 2022-07-04 NOTE — Evaluation (Signed)
Occupational Therapy Evaluation Patient Details Name: ONDINE FRANCIS MRN: [id number] DOB: 1976/02/07 Today's Date: 07/04/2022   History of Present Illness Patient is a 47 y/o female admitted 07/02/22 with headache, nausea and hot flash.  Found to have [place].  PMH positive for breast CA s/p B mastectomy and reconstruction, Migraines, depression, pre-diabetes.   Clinical Impression   Patient evaluated by Occupational Therapy with no further acute OT needs identified. All education has been completed and the patient has no further questions. Prior to admit, pt was independent with all ADL tasks and functional mobility living with her son and daughter. No follow up Occupational Therapy or equipment needs. OT is signing off. Thank you for this referral.       Recommendations for follow up therapy are one component of a multi-disciplinary discharge planning process, led by the attending physician.  Recommendations may be updated based on patient status, additional functional criteria and insurance authorization.   Assistance Recommended at Discharge None     Functional Status Assessment  Patient has not had a recent decline in their functional status  Equipment Recommendations  None recommended by OT       Precautions / Restrictions Precautions Precautions: None Restrictions Weight Bearing Restrictions: No      Mobility Bed Mobility Overal bed mobility: Independent   Patient Response: Cooperative  Transfers Overall transfer level: Independent Equipment used: None Transfers: Sit to/from Stand, Bed to chair/wheelchair/BSC Sit to Stand: Independent     Step pivot transfers: Independent            Balance Overall balance assessment: No apparent balance deficits (not formally assessed)        ADL either performed or assessed with clinical judgement   ADL Overall ADL's : Independent         General ADL Comments: Only provided assist with lines. No physical assist needed      Vision Baseline Vision/History: 1 Wears glasses (when at the computer) Ability to See in Adequate Light: 0 Adequate Patient Visual Report: No change from baseline Vision Assessment?: No apparent visual deficits            Pertinent Vitals/Pain Pain Assessment Pain Assessment: 0-10 Pain Score: 8  Pain Location: headache Pain Descriptors / Indicators: Headache Pain Intervention(s): Monitored during session     Hand Dominance Right   Extremity/Trunk Assessment Upper Extremity Assessment Upper Extremity Assessment: Overall WFL for tasks assessed   Lower Extremity Assessment Lower Extremity Assessment: Defer to PT evaluation   Cervical / Trunk Assessment Cervical / Trunk Assessment: Normal   Communication Communication Communication: No difficulties   Cognition Arousal/Alertness: Awake/alert Behavior During Therapy: WFL for tasks assessed/performed Overall Cognitive Status: Within Functional Limits for tasks assessed                       Home Living Family/patient expects to be discharged to:: Private residence Living Arrangements: Children Available Help at Discharge: Family Type of Home: Apartment Home Access: Level entry  Additional Comments: currently between residences, had an apartment worked out but fell through, but will be moving into apartment      Prior Functioning/Environment Prior Level of Function : Independent/Modified Independent    Mobility Comments: working at call center                   OT Goals(Current goals can be found in the care plan section) Acute Rehab OT Goals Patient Stated Goal: to decrease pain  OT Frequency:  1X  visit       AM-PAC OT "6 Clicks" Daily Activity     Outcome Measure Help from another person eating meals?: None Help from another person taking care of personal grooming?: None Help from another person toileting, which includes using toliet, bedpan, or urinal?: None Help from another person bathing  (including washing, rinsing, drying)?: None Help from another person to put on and taking off regular upper body clothing?: None Help from another person to put on and taking off regular lower body clothing?: None 6 Click Score: 24   End of Session    Activity Tolerance: Patient tolerated treatment well Patient left: in bed;with call bell/phone within reach;Other (comment) (handout with SLP)  OT Visit Diagnosis: Muscle weakness (generalized) (M62.81)                Time: [id number] OT Time Calculation (min): 12 min Charges:  OT General Charges $OT Visit: 1 Visit OT Evaluation $OT Eval Low Complexity: 1 Low  Ailene Ravel, OTR/L,CBIS  Supplemental OT - [facility] and [facility] Secure Chat Preferred    Shonette Rhames, Clarene Duke 07/04/2022, 10:07 AM

## 2022-07-04 NOTE — Progress Notes (Signed)
Physical Therapy Treatment Patient Details Name: Connie Underwood MRN: [id number] DOB: Mar 15, 1976 Today's Date: 07/04/2022   History of Present Illness Patient is a 47 y/o female admitted 07/02/22 with headache, nausea and hot flash.  Found to have [place].  PMH positive for breast CA s/p B mastectomy and reconstruction, Migraines, depression, pre-diabetes.    PT Comments    Patient progressing able to ambulate in hallway and noting no LOB even with head turns and simulating stepping over item.  Patient encouraged to continue frequent walks with nursing assist.  No further skilled PT needs as goals met.  PT will sign off.    Recommendations for follow up therapy are one component of a multi-disciplinary discharge planning process, led by the attending physician.  Recommendations may be updated based on patient status, additional functional criteria and insurance authorization.  Follow Up Recommendations       Assistance Recommended at Discharge Intermittent Supervision/Assistance  Patient can return home with the following Assist for transportation;Assistance with cooking/housework;Help with stairs or ramp for entrance   Equipment Recommendations       Recommendations for Other Services       Precautions / Restrictions Precautions Precautions: None     Mobility  Bed Mobility Overal bed mobility: Independent                  Transfers Overall transfer level: Independent Equipment used: None Transfers: Sit to/from Stand Sit to Stand: Independent           General transfer comment: moving unaided up from EOB and from toilet in bathroom    Ambulation/Gait Ambulation/Gait assistance: Supervision Gait Distance (Feet): 400 Feet Assistive device: None Gait Pattern/deviations: WFL(Within Functional Limits)       General Gait Details: performed quick stops, head turns, speed changes and simulated stepping over obstacle   Stairs             Wheelchair  Mobility    Modified Rankin (Stroke Patients Only) Modified Rankin (Stroke Patients Only) Pre-Morbid Rankin Score: No symptoms Modified Rankin: Moderate disability     Balance                                            Cognition Arousal/Alertness: Awake/alert Behavior During Therapy: WFL for tasks assessed/performed Overall Cognitive Status: Within Functional Limits for tasks assessed                                          Exercises      General Comments        Pertinent Vitals/Pain Pain Assessment Pain Score: 7  Pain Location: headache Pain Descriptors / Indicators: Headache Pain Intervention(s): Monitored during session, Premedicated before session    Home Living                          Prior Function            PT Goals (current goals can now be found in the care plan section) Progress towards PT goals: Goals met/education completed, patient discharged from PT    Frequency    Min 3X/week      PT Plan Current plan remains appropriate    Co-evaluation  AM-PAC PT "6 Clicks" Mobility   Outcome Measure  Help needed turning from your back to your side while in a flat bed without using bedrails?: None Help needed moving from lying on your back to sitting on the side of a flat bed without using bedrails?: None Help needed moving to and from a bed to a chair (including a wheelchair)?: None Help needed standing up from a chair using your arms (e.g., wheelchair or bedside chair)?: None Help needed to walk in hospital room?: None Help needed climbing 3-5 steps with a railing? : Total 6 Click Score: 21    End of Session   Activity Tolerance: Patient tolerated treatment well Patient left: in bed;with call bell/phone within reach   PT Visit Diagnosis: Muscle weakness (generalized) (M62.81);Difficulty in walking, not elsewhere classified (R26.2)     Time: [id number] PT Time Calculation  (min) (ACUTE ONLY): 10 min  Charges:  $Gait Training: 8-22 mins                     Magda Kiel, PT Acute Rehabilitation Services [phone] 07/04/2022    Connie Underwood 07/04/2022, 4:39 PM

## 2022-07-04 NOTE — Progress Notes (Signed)
  NEUROSURGERY PROGRESS NOTE   Pt seen and examined. No issues overnight. Some HA this am, otherwise no complaints.  EXAM: Temp:  [98.1 F (36.7 C)-98.6 F (37 C)] 98.1 F (36.7 C) (04/04 0800) Pulse Rate:  [45-85] 57 (04/04 0700) Resp:  [7-23] 14 (04/04 0700) BP: (89-130)/(52-87) 109/69 (04/04 0600) SpO2:  [84 %-100 %] 100 % (04/04 0700) Intake/Output      04/03 0701 04/04 0700 04/04 0701 04/05 0700   I.V. (mL/kg) 1080.1 (10.1)    Total Intake(mL/kg) 1080.1 (10.1)    Urine (mL/kg/hr) 900 (0.4)    Blood 10    Total Output 910    Net +170.1         Urine Occurrence 1 x     Awake, alert, oriented Speech fluent CN intact 5/5 BUE/BLE, no drift Right groin site soft  LABS: Lab Results  Component Value Date   CREATININE 0.77 07/02/2022   BUN 10 07/02/2022   NA 135 07/02/2022   K 3.2 (L) 07/02/2022   CL 105 07/02/2022   CO2 23 07/02/2022   Lab Results  Component Value Date   WBC 13.3 (H) 07/03/2022   HGB 9.7 (L) 07/03/2022   HCT 29.9 (L) 07/03/2022   MCV 81.5 07/03/2022   PLT 353 07/03/2022    IMAGING: Angio yesterday without evidence of cerebral aneurysms, AVM, or fistulas. No spasm.  IMPRESSION: - 47 y.o. female angio negative low-grade SAH d# 2, doing well.  PLAN: - Cont supportive care and observation in ICU - Cont Nimotop - Pt tolerating good PO, minimal SAH so low-risk of spasm, can KVO IVF - Plan on repeat CT/CTA on Sunday or Monday. If negative and clinically stable, likely d/c after that.   Consuella Lose, MD [facility]

## 2022-07-04 NOTE — Progress Notes (Signed)
Patient with SBP low 100s now dropping into the 90s, Dr. Christella Noa aware. New orders received will continue to monitor.

## 2022-07-05 ENCOUNTER — Inpatient Hospital Stay (HOSPITAL_COMMUNITY): Payer: No Typology Code available for payment source

## 2022-07-05 DIAGNOSIS — I609 Nontraumatic subarachnoid hemorrhage, unspecified: Secondary | ICD-10-CM

## 2022-07-05 MED ORDER — POLYETHYLENE GLYCOL 3350 17 G PO PACK: 17.0000 g | PACK | Freq: Every day | ORAL | Status: DC | PRN

## 2022-07-05 NOTE — Progress Notes (Signed)
  NEUROSURGERY PROGRESS NOTE   Pt seen and examined. No issues overnight. Min HA this am.  EXAM: Temp:  [98.2 F (36.8 C)-98.4 F (36.9 C)] 98.3 F (36.8 C) (04/05 1200) Pulse Rate:  [56-107] 58 (04/05 1400) Resp:  [11-28] 13 (04/05 1400) BP: (83-126)/(42-85) 109/66 (04/05 1400) SpO2:  [71 %-100 %] 98 % (04/05 1400) Intake/Output      04/04 0701 04/05 0700 04/05 0701 04/06 0700   I.V. (mL/kg)     Total Intake(mL/kg)     Urine (mL/kg/hr)     Blood     Total Output     Net          Urine Occurrence 4 x 1 x    Awake, alert, oriented Speech fluent CN intact 5/5 BUE/BLE, no drift Right groin site soft  LABS: Lab Results  Component Value Date   CREATININE 0.77 07/02/2022   BUN 10 07/02/2022   NA 135 07/02/2022   K 3.2 (L) 07/02/2022   CL 105 07/02/2022   CO2 23 07/02/2022   Lab Results  Component Value Date   WBC 13.3 (H) 07/03/2022   HGB 9.7 (L) 07/03/2022   HCT 29.9 (L) 07/03/2022   MCV 81.5 07/03/2022   PLT 353 07/03/2022    IMPRESSION: - 47 y.o. female angio negative low-grade SAH d# 3, doing well.  PLAN: - Cont supportive care and observation in ICU - Cont Nimotop - Plan on repeat CT/CTA on Sunday or Monday. If negative and clinically stable, likely d/c after that.   Lisbeth Renshaw, MD [facility]

## 2022-07-05 NOTE — Progress Notes (Signed)
Transcranial Doppler  Date POD PCO2 HCT BP  MCA ACA PCA OPHT SIPH VERT Basilar  4/3 RH 0  29.9 102/ 74 Right  Left   40  46   -40  -34   41  22   10  11    44  48   -45  -36   -43      4/5 MM/JH 2   110/60 Right  Left   65  57   -37  -39   41  34   27  9   32  35   -48  -47   -55           Right  Left                                             Right  Left                                             Right  Left                                            Right  Left                                            Right  Left                                        MCA = Middle Cerebral Artery      OPHT = Opthalmic Artery     BASILAR = Basilar Artery   ACA = Anterior Cerebral Artery     SIPH = Carotid Siphon PCA = Posterior Cerebral Artery   VERT = Verterbral Artery                   Normal MCA = 62+\-12 ACA = 50+\-12 PCA = 42+\-23   *= Unable to insonate  RT Lindegaard = 2.5 LT Lindegaard = 1.63  Results can be found under chart review under CV PROC. 07/05/2022 7:01 PM Connie Underwood RVT, RDMS

## 2022-07-06 NOTE — Progress Notes (Signed)
  NEUROSURGERY PROGRESS NOTE   Pt seen and examined. No issues overnight. Min HA  EXAM: Temp:  [98.3 F (36.8 C)-98.6 F (37 C)] 98.5 F (36.9 C) (04/06 0800) Pulse Rate:  [52-84] 56 (04/06 0800) Resp:  [10-21] 14 (04/06 0800) BP: (83-136)/(38-76) 113/64 (04/06 0800) SpO2:  [92 %-100 %] 96 % (04/06 0800) Intake/Output      04/05 0701 04/06 0700 04/06 0701 04/07 0700   P.O. 1350    Total Intake(mL/kg) 1350 (12.6)    Net +1350         Urine Occurrence 4 x    Stool Occurrence 1 x     Awake, alert, oriented Speech fluent CN intact 5/5 BUE/BLE, no drift Right groin site soft  LABS: Lab Results  Component Value Date   CREATININE 0.77 07/02/2022   BUN 10 07/02/2022   NA 135 07/02/2022   K 3.2 (L) 07/02/2022   CL 105 07/02/2022   CO2 23 07/02/2022   Lab Results  Component Value Date   WBC 13.3 (H) 07/03/2022   HGB 9.7 (L) 07/03/2022   HCT 29.9 (L) 07/03/2022   MCV 81.5 07/03/2022   PLT 353 07/03/2022    IMPRESSION: - 47 y.o. female angio negative low-grade SAH d# 4, doing well.  PLAN: - Cont supportive care and observation in ICU - Cont Nimotop - Plan on repeat CT/CTA tomorrow. If negative and clinically stable, likely d/c after that.   Lisbeth Renshaw, MD [facility]

## 2022-07-07 NOTE — Plan of Care (Signed)
  Problem: Education: Goal: Knowledge of disease or condition will improve Outcome: Progressing Goal: Knowledge of secondary prevention will improve (MUST DOCUMENT ALL) Outcome: Progressing Goal: Knowledge of patient specific risk factors will improve Loraine Leriche N/A or DELETE if not current risk factor) Outcome: Progressing   Problem: Spontaneous Subarachnoid Hemorrhage Tissue Perfusion: Goal: Complications of Spontaneous Subarachnoid Hemorrhage will be minimized Outcome: Progressing   Problem: Coping: Goal: Will verbalize positive feelings about self Outcome: Progressing Goal: Will identify appropriate support needs Outcome: Progressing   Problem: Health Behavior/Discharge Planning: Goal: Ability to manage health-related needs will improve Outcome: Progressing Goal: Goals will be collaboratively established with patient/family Outcome: Progressing   Problem: Self-Care: Goal: Ability to participate in self-care as condition permits will improve Outcome: Progressing Goal: Verbalization of feelings and concerns over difficulty with self-care will improve Outcome: Progressing Goal: Ability to communicate needs accurately will improve Outcome: Progressing   Problem: Nutrition: Goal: Risk of aspiration will decrease Outcome: Progressing Goal: Dietary intake will improve Outcome: Progressing   Problem: Education: Goal: Knowledge of General Education information will improve Description: Including pain rating scale, medication(s)/side effects and non-pharmacologic comfort measures Outcome: Progressing   Problem: Health Behavior/Discharge Planning: Goal: Ability to manage health-related needs will improve Outcome: Progressing   Problem: Clinical Measurements: Goal: Ability to maintain clinical measurements within normal limits will improve Outcome: Progressing Goal: Will remain free from infection Outcome: Progressing Goal: Diagnostic test results will improve Outcome:  Progressing Goal: Respiratory complications will improve Outcome: Progressing Goal: Cardiovascular complication will be avoided Outcome: Progressing   Problem: Activity: Goal: Risk for activity intolerance will decrease Outcome: Progressing   Problem: Nutrition: Goal: Adequate nutrition will be maintained Outcome: Progressing   Problem: Coping: Goal: Level of anxiety will decrease Outcome: Progressing   Problem: Elimination: Goal: Will not experience complications related to bowel motility Outcome: Progressing Goal: Will not experience complications related to urinary retention Outcome: Progressing   Problem: Pain Managment: Goal: General experience of comfort will improve Outcome: Progressing   Problem: Safety: Goal: Ability to remain free from injury will improve Outcome: Progressing   Problem: Skin Integrity: Goal: Risk for impaired skin integrity will decrease Outcome: Progressing   Problem: Education: Goal: Understanding of CV disease, CV risk reduction, and recovery process will improve Outcome: Progressing Goal: Individualized Educational Video(s) Outcome: Progressing   Problem: Activity: Goal: Ability to return to baseline activity level will improve Outcome: Progressing   Problem: Cardiovascular: Goal: Ability to achieve and maintain adequate cardiovascular perfusion will improve Outcome: Progressing Goal: Vascular access site(s) Level 0-1 will be maintained Outcome: Progressing   Problem: Health Behavior/Discharge Planning: Goal: Ability to safely manage health-related needs after discharge will improve Outcome: Progressing

## 2022-07-07 NOTE — Plan of Care (Signed)
  Problem: Education: Goal: Knowledge of disease or condition will improve Outcome: Progressing Goal: Knowledge of secondary prevention will improve (MUST DOCUMENT ALL) Outcome: Progressing Goal: Knowledge of patient specific risk factors will improve Loraine Leriche N/A or DELETE if not current risk factor) Outcome: Progressing   Problem: Spontaneous Subarachnoid Hemorrhage Tissue Perfusion: Goal: Complications of Spontaneous Subarachnoid Hemorrhage will be minimized Outcome: Progressing   Problem: Coping: Goal: Will verbalize positive feelings about self Outcome: Progressing Goal: Will identify appropriate support needs Outcome: Progressing   Problem: Health Behavior/Discharge Planning: Goal: Ability to manage health-related needs will improve Outcome: Progressing Goal: Goals will be collaboratively established with patient/family Outcome: Progressing   Problem: Self-Care: Goal: Ability to participate in self-care as condition permits will improve Outcome: Progressing Goal: Verbalization of feelings and concerns over difficulty with self-care will improve Outcome: Progressing Goal: Ability to communicate needs accurately will improve Outcome: Progressing   Problem: Nutrition: Goal: Risk of aspiration will decrease Outcome: Progressing Goal: Dietary intake will improve Outcome: Progressing   Problem: Education: Goal: Knowledge of General Education information will improve Description: Including pain rating scale, medication(s)/side effects and non-pharmacologic comfort measures Outcome: Progressing   Problem: Health Behavior/Discharge Planning: Goal: Ability to manage health-related needs will improve Outcome: Progressing   Problem: Clinical Measurements: Goal: Ability to maintain clinical measurements within normal limits will improve Outcome: Progressing Goal: Diagnostic test results will improve Outcome: Progressing Goal: Respiratory complications will improve Outcome:  Progressing Goal: Cardiovascular complication will be avoided Outcome: Progressing   Problem: Activity: Goal: Risk for activity intolerance will decrease Outcome: Progressing   Problem: Nutrition: Goal: Adequate nutrition will be maintained Outcome: Progressing   Problem: Coping: Goal: Level of anxiety will decrease Outcome: Progressing   Problem: Elimination: Goal: Will not experience complications related to bowel motility Outcome: Progressing Goal: Will not experience complications related to urinary retention Outcome: Progressing   Problem: Pain Managment: Goal: General experience of comfort will improve Outcome: Progressing   Problem: Safety: Goal: Ability to remain free from injury will improve Outcome: Progressing   Problem: Skin Integrity: Goal: Risk for impaired skin integrity will decrease Outcome: Progressing   Problem: Education: Goal: Understanding of CV disease, CV risk reduction, and recovery process will improve Outcome: Progressing Goal: Individualized Educational Video(s) Outcome: Progressing   Problem: Activity: Goal: Ability to return to baseline activity level will improve Outcome: Progressing   Problem: Cardiovascular: Goal: Ability to achieve and maintain adequate cardiovascular perfusion will improve Outcome: Progressing Goal: Vascular access site(s) Level 0-1 will be maintained Outcome: Progressing   Problem: Health Behavior/Discharge Planning: Goal: Ability to safely manage health-related needs after discharge will improve Outcome: Progressing

## 2022-07-07 NOTE — Progress Notes (Signed)
  NEUROSURGERY PROGRESS NOTE   Pt seen and examined. No issues overnight. Min HA  EXAM: Temp:  [98.2 F (36.8 C)-99 F (37.2 C)] 98.4 F (36.9 C) (04/07 0800) Pulse Rate:  [54-77] 57 (04/07 0700) Resp:  [12-17] 16 (04/06 1600) BP: (92-140)/(46-99) 124/74 (04/07 0700) SpO2:  [91 %-100 %] 99 % (04/07 0700) Intake/Output      04/06 0701 04/07 0700 04/07 0701 04/08 0700   P.O.     Total Intake(mL/kg)     Net          Urine Occurrence 1 x     Awake, alert, oriented Speech fluent CN intact 5/5 BUE/BLE, no drift Right groin site soft  LABS: Lab Results  Component Value Date   CREATININE 0.77 07/02/2022   BUN 10 07/02/2022   NA 135 07/02/2022   K 3.2 (L) 07/02/2022   CL 105 07/02/2022   CO2 23 07/02/2022   Lab Results  Component Value Date   WBC 13.3 (H) 07/03/2022   HGB 9.7 (L) 07/03/2022   HCT 29.9 (L) 07/03/2022   MCV 81.5 07/03/2022   PLT 353 07/03/2022    IMPRESSION: - 47 y.o. female angio negative low-grade SAH d# 5, doing well.  PLAN: - Cont supportive care and observation in ICU - Cont Nimotop - Plan on repeat CT/CTA tomorrow am. If negative and clinically stable, likely d/c after that.   Lisbeth Renshaw, MD [facility]

## 2022-07-08 ENCOUNTER — Other Ambulatory Visit (HOSPITAL_COMMUNITY): Payer: No Typology Code available for payment source

## 2022-07-08 ENCOUNTER — Inpatient Hospital Stay (HOSPITAL_COMMUNITY): Payer: No Typology Code available for payment source

## 2022-07-08 ENCOUNTER — Other Ambulatory Visit (HOSPITAL_COMMUNITY): Payer: Self-pay

## 2022-07-08 ENCOUNTER — Encounter: Payer: Self-pay | Admitting: Hematology

## 2022-07-08 MED ORDER — NIMODIPINE 30 MG PO CAPS
30.0000 mg | ORAL_CAPSULE | ORAL | 0 refills | Status: DC
  Filled 2022-07-08: qty 180, 15d supply, fill #0

## 2022-07-08 MED ORDER — HYDROCODONE-ACETAMINOPHEN 5-325 MG PO TABS: 1.0000 | ORAL_TABLET | Freq: Four times a day (QID) | ORAL | 0 refills | Status: AC | PRN

## 2022-07-08 MED ORDER — IOHEXOL 350 MG/ML SOLN
75.0000 mL | Freq: Once | INTRAVENOUS | Status: AC | PRN
  Administered 2022-07-08: 75 mL via INTRAVENOUS

## 2022-07-08 NOTE — Discharge Summary (Signed)
Physician Discharge Summary  Patient ID: Connie Underwood MRN: [id number] DOB/AGE: November 24, 1975 47 y.o.  Admit date: 07/02/2022 Discharge date: 07/08/2022  Admission Diagnoses:  [facility]  Discharge Diagnoses:  Same Principal Problem:   SAH (subarachnoid hemorrhage)   Discharged Condition: Stable  Hospital Course:  Connie Underwood is a 47 y.o. female admitted after onset of HA with CT demonstrating SAH. Initial CTA and catheter angio negative. She was monitored in the ICU without any neurologic change. Repeat CTA after about 6 days demonstrated resolution of SAH and no aneurysms or vascular malformations. She was then d/ced in stable condition, ambulating well, tolerating diet.  Treatments: Observation, angiogram  Discharge Exam: Blood pressure 119/75, pulse 63, temperature 98.3 F (36.8 C), temperature source Oral, resp. rate 17, height 5\' 4"  (1.626 m), weight 106.8 kg, last menstrual period 06/12/2022, SpO2 94 %. Awake, alert, oriented Speech fluent, appropriate CN grossly intact 5/5 BUE/BLE Wound c/d/i  Disposition: Discharge disposition: 01-Home or Self Care       Discharge Instructions     Call MD for:  redness, tenderness, or signs of infection (pain, swelling, redness, odor or green/yellow discharge around incision site)   Complete by: As directed    Call MD for:  temperature >100.4   Complete by: As directed    Diet - low sodium heart healthy   Complete by: As directed    Discharge instructions   Complete by: As directed    Walk at home as much as possible, at least 4 times / day   Increase activity slowly   Complete by: As directed    Lifting restrictions   Complete by: As directed    No lifting > 10 lbs   May shower / Bathe   Complete by: As directed    48 hours after surgery   May walk up steps   Complete by: As directed    No wound care   Complete by: As directed    Other Restrictions   Complete by: As directed    No bending/twisting at waist       Allergies as of 07/08/2022       Reactions   Tomato    Hives   Pollen Extract Other (See Comments)   Seasonal allergies         Medication List     TAKE these medications    Emgality 120 MG/ML Soaj Generic drug: Galcanezumab-gnlm Inject 1 mL into the skin every 30 (thirty) days.   HYDROcodone-acetaminophen 5-325 MG tablet Commonly known as: NORCO/VICODIN Take 1 tablet by mouth every 6 (six) hours as needed for up to 7 days for severe pain.   niMODipine 30 MG capsule Commonly known as: NIMOTOP Take 1 capsule (30 mg total) by mouth every 2 (two) hours for 15 days.   SUMAtriptan 50 MG tablet Commonly known as: IMITREX Take 50 mg by mouth every 2 (two) hours as needed for migraine.   tamoxifen 20 MG tablet Commonly known as: NOLVADEX TAKE 1 TABLET BY MOUTH EVERY DAY   Vitamin D (Ergocalciferol) 1.25 MG (50000 UNIT) Caps capsule Commonly known as: DRISDOL Take 50,000 Units by mouth once a week.        Follow-up Information     Lisbeth Renshaw, MD Follow up.   Specialty: Neurosurgery Contact information: [address] [place] [address] [phone]                 Signed: Jackelyn Hoehn 07/08/2022, 9:59 AM

## 2022-07-08 NOTE — Progress Notes (Signed)
  NEUROSURGERY PROGRESS NOTE   Pt seen and examined. No issues overnight. Min HA  EXAM: Temp:  [98.2 F (36.8 C)-99.4 F (37.4 C)] 98.3 F (36.8 C) (04/08 0800) Pulse Rate:  [51-83] 63 (04/08 0800) Resp:  [13-20] 17 (04/08 0800) BP: (114-142)/(62-88) 119/75 (04/08 0800) SpO2:  [94 %-100 %] 94 % (04/08 0800) Intake/Output      04/07 0701 04/08 0700 04/08 0701 04/09 0700   P.O. 600    Total Intake(mL/kg) 600 (5.6)    Net +600         Urine Occurrence 1 x     Awake, alert, oriented Speech fluent CN intact 5/5 BUE/BLE, no drift  LABS: Lab Results  Component Value Date   CREATININE 0.77 07/02/2022   BUN 10 07/02/2022   NA 135 07/02/2022   K 3.2 (L) 07/02/2022   CL 105 07/02/2022   CO2 23 07/02/2022   Lab Results  Component Value Date   WBC 13.3 (H) 07/03/2022   HGB 9.7 (L) 07/03/2022   HCT 29.9 (L) 07/03/2022   MCV 81.5 07/03/2022   PLT 353 07/03/2022   IMAGING: CTA this am reviewed and shows resolution of previously seen SAH. No aneurysms identified.  IMPRESSION: - 47 y.o. female angio negative low-grade SAH d# 6, doing well.  PLAN: - Can d/c home today, finish 21d nimodipine   Lisbeth Renshaw, MD [facility]

## 2022-07-08 NOTE — Progress Notes (Signed)
This RN was informed by CT tech that pt must have PIV that is five or fewer days old for CTA. Pt only has one PIV that is six days old.   Per Dr. Otelia Limes (Neuro), pt has permission to use existing PIV. IV appears to be reliable and unlikely to fail during CTA.   Lady Deutscher, RN

## 2022-07-18 ENCOUNTER — Encounter: Payer: Self-pay | Admitting: Hematology

## 2022-07-18 ENCOUNTER — Other Ambulatory Visit: Payer: Self-pay | Admitting: *Deleted

## 2022-07-18 MED ORDER — TAMOXIFEN CITRATE 20 MG PO TABS: 20.0000 mg | ORAL_TABLET | Freq: Every day | ORAL | 2 refills | Status: DC

## 2022-07-22 ENCOUNTER — Other Ambulatory Visit: Payer: BC Managed Care – PPO

## 2022-07-22 ENCOUNTER — Ambulatory Visit: Payer: BC Managed Care – PPO | Admitting: Nurse Practitioner

## 2022-08-11 NOTE — Assessment & Plan Note (Signed)
ductal carcinoma, [id number], Stage IB, ER+/PR+/HER2-, Grade 2, Oncotype RS 19  -presented with a palpable right breast mass. Oncotype on biopsy RS of 19, low risk. -s/p right mastectomy by Dr. Dwain Sarna and tissue expander placement by Dr. Arita Miss on 12/25/20. Pathology showed: IDC with lobular features, grade 2, spanning 3.1 cm; intermediate grade DCIS; margins and lymph nodes negative except one node containing isolated tumor cells -she started tamoxifen on 10/18/20. Tolerating well with hot flashes.  -most recent left mammogram on 08/30/21 was negative. Given concern for enhancement seen on MRI, biopsy was performed on 09/05/21, path was benign. -we again discussed ovarian suppression, mainly in light of her very heavy periods. She can consider Zoladex injections or BSO. I explained these would put her into early menopause. I noted the benefit of the Zoladex is that it's reversible. I encouraged her to discuss with her PCP/GYN. She will think about it.

## 2022-08-11 NOTE — Assessment & Plan Note (Signed)
-  long-standing h/o chronic anemia secondary to iron deficiency from menorrhagia -she has received IV Venofer as needed since 10/2020, last 05/10/21. -will obtain labs today to determine if she needs additional IV iron.

## 2022-08-12 ENCOUNTER — Inpatient Hospital Stay (HOSPITAL_BASED_OUTPATIENT_CLINIC_OR_DEPARTMENT_OTHER): Payer: No Typology Code available for payment source | Admitting: Hematology

## 2022-08-12 ENCOUNTER — Encounter: Payer: Self-pay | Admitting: Hematology

## 2022-08-12 ENCOUNTER — Other Ambulatory Visit: Payer: Self-pay

## 2022-08-12 ENCOUNTER — Inpatient Hospital Stay: Payer: No Typology Code available for payment source | Attending: Hematology

## 2022-08-12 VITALS — BP 122/77 | HR 78 | Temp 99.6°F | Resp 16 | Ht 64.0 in | Wt 233.4 lb

## 2022-08-12 DIAGNOSIS — N920 Excessive and frequent menstruation with regular cycle: Secondary | ICD-10-CM | POA: Insufficient documentation

## 2022-08-12 DIAGNOSIS — D500 Iron deficiency anemia secondary to blood loss (chronic): Secondary | ICD-10-CM

## 2022-08-12 DIAGNOSIS — Z170 Estrogen receptor positive status [ER+]: Secondary | ICD-10-CM | POA: Insufficient documentation

## 2022-08-12 DIAGNOSIS — C50211 Malignant neoplasm of upper-inner quadrant of right female breast: Secondary | ICD-10-CM | POA: Insufficient documentation

## 2022-08-12 DIAGNOSIS — Z8679 Personal history of other diseases of the circulatory system: Secondary | ICD-10-CM | POA: Insufficient documentation

## 2022-08-12 DIAGNOSIS — Z79810 Long term (current) use of selective estrogen receptor modulators (SERMs): Secondary | ICD-10-CM | POA: Diagnosis not present

## 2022-08-12 LAB — CBC WITH DIFFERENTIAL/PLATELET
Abs Immature Granulocytes: 0.01 10*3/uL (ref 0.00–0.07)
Basophils Absolute: 0 10*3/uL (ref 0.0–0.1)
Basophils Relative: 0 %
Eosinophils Absolute: 0.3 10*3/uL (ref 0.0–0.5)
Eosinophils Relative: 4 %
HCT: 31.4 % — ABNORMAL LOW (ref 36.0–46.0)
Hemoglobin: 9.9 g/dL — ABNORMAL LOW (ref 12.0–15.0)
Immature Granulocytes: 0 %
Lymphocytes Relative: 27 %
Lymphs Abs: 2.1 10*3/uL (ref 0.7–4.0)
MCH: 25.2 pg — ABNORMAL LOW (ref 26.0–34.0)
MCHC: 31.5 g/dL (ref 30.0–36.0)
MCV: 79.9 fL — ABNORMAL LOW (ref 80.0–100.0)
Monocytes Absolute: 0.6 10*3/uL (ref 0.1–1.0)
Monocytes Relative: 8 %
Neutro Abs: 4.8 10*3/uL (ref 1.7–7.7)
Neutrophils Relative %: 61 %
Platelets: 322 10*3/uL (ref 150–400)
RBC: 3.93 MIL/uL (ref 3.87–5.11)
RDW: 16.8 % — ABNORMAL HIGH (ref 11.5–15.5)
WBC: 7.9 10*3/uL (ref 4.0–10.5)
nRBC: 0 % (ref 0.0–0.2)

## 2022-08-12 LAB — FERRITIN: Ferritin: 5 ng/mL — ABNORMAL LOW (ref 11–307)

## 2022-08-12 NOTE — Progress Notes (Signed)
[facility]   [phone] Fax:([phone]) [phone]   Clinic Follow up Note   Patient Care Team: Soundra Pilon, FNP as PCP - General (Family Medicine) Pershing Proud, RN as Oncology Nurse Navigator Donnelly Angelica, RN as Oncology Nurse Navigator Emelia Loron, MD as Consulting Physician (General Surgery) Malachy Mood, MD as Consulting Physician (Hematology) Antony Blackbird, MD as Consulting Physician (Radiation Oncology)  Date of Service:  08/12/2022  CHIEF COMPLAINT: f/u of ight breast cancer, anemia    CURRENT THERAPY:   Tamoxifen, started neoadjuvantly on 10/18/20    ASSESSMENT: Connie Underwood is a 47 y.o. female with   Initial exam Malignant neoplasm of upper-inner quadrant of right breast in female, estrogen receptor positive (HCC)  ductal carcinoma, pT2N55miM0, Stage IB, ER+/PR+/HER2-, Grade 2, Oncotype RS 19  -presented with a palpable right breast mass. Oncotype on biopsy RS of 19, low risk. -s/p right mastectomy by Dr. Dwain Sarna and tissue expander placement by Dr. Arita Miss on 12/25/20. Pathology showed: IDC with lobular features, grade 2, spanning 3.1 cm; intermediate grade DCIS; margins and lymph nodes negative except one node containing isolated tumor cells -she started tamoxifen on 10/18/20. Tolerating well with hot flashes.  -most recent left mammogram on 08/30/21 was negative. Given concern for enhancement seen on MRI, biopsy was performed on 09/05/21, path was benign. -She is clinically doing well, exam was unremarkable, there is no clinical concern for recurrence -Will continue tamoxifen.  She plans to have hysterectomy later this year. -We discussed switching to AI if she became postmenopausal.  Iron deficiency anemia due to chronic blood loss -long-standing h/o chronic anemia secondary to iron deficiency from menorrhagia -she has received IV Venofer as needed since 10/2020, last 05/10/21. -will obtain labs today to determine if she needs  additional IV iron.  SAH  -She had subarachnoid hemorrhage and was hospitalized on July 02, 2022, she has recovered well.  She did not require surgical intervention.    PLAN: -lab reviewed -hg 9.9 -Ferretin pending, will give IV iron if needed. -Continue Tamoxifen for 10 years -f/u q2 months for lab  F/u in 6 months  SUMMARY OF ONCOLOGIC HISTORY: Oncology History Overview Note  Cancer Staging Malignant neoplasm of upper-inner quadrant of right breast in female, estrogen receptor positive (HCC) Staging form: Breast, AJCC 8th Edition - Clinical stage from 10/06/2020: Stage IB (cT2, cN0, cM0, G2, ER+, PR+, HER2-) - Signed by Malachy Mood, MD on 10/17/2020 Stage prefix: Initial diagnosis Histologic grading system: 3 grade system - Pathologic stage from 12/25/2020: No Stage Recommended (ypT2, pN17mi, cM0, G2, ER+, PR+, HER2-) - Signed by Malachy Mood, MD on 01/22/2021 Stage prefix: Post-therapy Histologic grading system: 3 grade system Residual tumor (R): R0 - None    Malignant neoplasm of upper-inner quadrant of right breast in female, estrogen receptor positive (HCC)  10/06/2020 Mammogram   DIGITAL DIAGNOSTIC BILATERAL MAMMOGRAM WITH TOMOSYNTHESIS AND CAD; ULTRASOUND RIGHT BREAST LIMITED  IMPRESSION: Two contiguous hypoechoic masses in the right breast- an irregular hypoechoic mass in the right breast at 12:30 3 cm from the nipple measuring 2.5 x 2.1 x 3.0 cm, and a hypoechoic heterogeneous mass at 12:30 2 cm from the nipple measuring 3.0 x 1.6 x 2.8 cm. Sonographic evaluation of the right axilla does not show any enlarged adenopathy.   10/06/2020 Pathology Results   Diagnosis Breast, right, needle core biopsy - INVASIVE MAMMARY CARCINOMA WITH LOBULAR FEATURES - SEE COMMENT Microscopic Comment The biopsy material shows an infiltrative proliferation of cells with  large vesicular nuclei with inconspicuous nucleoli, arranged linearly and in small clusters. Based on the biopsy, the carcinoma  appears Nottingham grade 2 of 3 and measures 0.9 cm in greatest linear extent  E-cadherin is POSITIVE supporting a ductal origin.  PROGNOSTIC INDICATORS Results: IMMUNOHISTOCHEMICAL AND MORPHOMETRIC ANALYSIS PERFORMED MANUALLY The tumor cells are NEGATIVE for Her2 (1+). Estrogen Receptor: 80%, POSITIVE, STRONG STAINING INTENSITY Progesterone Receptor: 90%, POSITIVE, STRONG STAINING INTENSITY Proliferation Marker Ki67: 10%   10/06/2020 Cancer Staging   Staging form: Breast, AJCC 8th Edition - Clinical stage from 10/06/2020: Stage IB (cT2, cN0, cM0, G2, ER+, PR+, HER2-) - Signed by Malachy Mood, MD on 10/17/2020 Stage prefix: Initial diagnosis Histologic grading system: 3 grade system   10/06/2020 Oncotype testing   RS of 19, distant recurrence risk of 6%, chemotherapy not recommended   10/13/2020 Initial Diagnosis   Malignant neoplasm of upper-inner quadrant of right breast in female, estrogen receptor positive (HCC)   10/24/2020 Genetic Testing   Negative hereditary cancer genetic testing: no pathogenic variants detected in [employer] BRCAPlus Panel and [employer] CancerNext-Expanded +RNAinsight.  Variants of uncertain significance detected in FH at p.Y2H (c.4T>C) and SUFU at  p.A138T (c.412G>A). The report dates are October 24, 2020 and October 27, 2020, respectively.    FH p.Y2H (c.4T>C) variant of uncertain significance was reclassified to "likely benign". Report date is 03/15/2021. SUFU p.A138T (c.412G>A) variant of uncertain significance was reclassified to "benign". Report date is 10/05/2021.  The BRCAplus panel offered by [employer] and includes sequencing and deletion/duplication analysis for the following 8 genes: ATM, BRCA1, BRCA2, CDH1, CHEK2, PALB2, PTEN, and TP53.  The CancerNext-Expanded gene panel offered by [employer] [facility] and includes sequencing, rearrangement, and RNA analysis for the following 77 genes: AIP, ALK, APC, ATM, AXIN2, BAP1, BARD1, BLM, BMPR1A, BRCA1, BRCA2, BRIP1, CDC73, CDH1, CDK4,  CDKN1B, CDKN2A, CHEK2, CTNNA1, DICER1, FANCC, FH, FLCN, GALNT12, KIF1B, LZTR1, MAX, MEN1, MET, MLH1, MSH2, MSH3, MSH6, MUTYH, NBN, NF1, NF2, NTHL1, PALB2, PHOX2B, PMS2, POT1, PRKAR1A, PTCH1, PTEN, RAD51C, RAD51D, RB1, RECQL, RET, SDHA, SDHAF2, SDHB, SDHC, SDHD, SMAD4, SMARCA4, SMARCB1, SMARCE1, STK11, SUFU, TMEM127, TP53, TSC1, TSC2, VHL and XRCC2 (sequencing and deletion/duplication); EGFR, EGLN1, HOXB13, KIT, MITF, PDGFRA, POLD1, and POLE (sequencing only); EPCAM and GREM1 (deletion/duplication only).     11/09/2020 Imaging   Breast MRI  IMPRESSION: 1. Known biopsy-proven invasive carcinoma within the upper inner quadrant of the RIGHT breast, measuring 3.6 cm, containing a biopsy clip. Contiguous linear non-mass enhancement extends superior-lateral to the spiculated mass, increasing overall craniocaudal dimension to 3 cm. 2. Additional irregular enhancing mass within the inner RIGHT breast, 3 o'clock axis region, at anterior depth, measuring 7 mm, with suspicious washout kinetics. MRI-guided biopsy is recommended to exclude multifocal disease. 3. Additional linear clumped non-mass enhancement within the upper inner quadrant of the LEFT breast, at posterior depth, measuring 1.8 cm extent, with suspicious washout kinetics. This may represent an intramammary lymph node. MRI-guided biopsy is recommended to exclude contralateral disease.   12/25/2020 Cancer Staging   Staging form: Breast, AJCC 8th Edition - Pathologic stage from 12/25/2020: No Stage Recommended (ypT2, pN66mi, cM0, G2, ER+, PR+, HER2-) - Signed by Malachy Mood, MD on 01/22/2021 Stage prefix: Post-therapy Histologic grading system: 3 grade system Residual tumor (R): R0 - None   12/25/2020 Definitive Surgery   FINAL MICROSCOPIC DIAGNOSIS:   A. BREAST, RIGHT, MASTECTOMY:  - Invasive ductal carcinoma with lobular features, grade 2, spanning 3.1 cm.  - Intermediate grade ductal carcinoma in situ.  - Margins are  negative for carcinoma.   - Lymphovascular invasion.  - Biopsy site.  - Fibroadenomatoid change.  - Pseudoangiomatous stromal hyperplasia.  - Intraductal papilloma.  - See oncology table.   B. LYMPH NODE, RIGHT AXILLARY, SENTINEL EXCISION:  - Isolated tumor cells in one of one lymph node (0/1).   C. LYMPH NODE, RIGHT AXILLARY, SENTINEL EXCISION:  - One of one lymph nodes negative for carcinoma. (0/1).   04/19/2021 Survivorship   SCP delivered by Santiago Glad, NP      INTERVAL HISTORY:  Connie Underwood is here for a follow up of ight breast cancer, anemia. She was last seen by me on 01/21/2022. She presents to the clinic alone. Pt stated that she was hospitalized for hemorrhaging on the brain.Pt stayed in the hospital for a week.she recovered very well. Pt still taking Tamoxifen and she has hot flashes that are tolerable.      All other systems were reviewed with the patient and are negative.  MEDICAL HISTORY:  Past Medical History:  Diagnosis Date   Anemia    Anxiety    Breast cancer (HCC)    right   Depression    Family history of prostate cancer 10/18/2020   Headache    migraines   Hx of migraines    Pre-diabetes    Seasonal allergic reaction     SURGICAL HISTORY: Past Surgical History:  Procedure Laterality Date   BREAST RECONSTRUCTION WITH PLACEMENT OF TISSUE EXPANDER AND FLEX HD (ACELLULAR HYDRATED DERMIS) Right 12/25/2020   Procedure: RIGHT BREAST RECONSTRUCTION WITH PLACEMENT OF TISSUE EXPANDER AND FLEX HD (ACELLULAR HYDRATED DERMIS);  Surgeon: Allena Napoleon, MD;  Location: [facility] OR;  Service: Plastics;  Laterality: Right;   IR ANGIO EXTERNAL CAROTID SEL EXT CAROTID BILAT MOD SED  07/03/2022   IR ANGIO INTRA EXTRACRAN SEL INTERNAL CAROTID BILAT MOD SED  07/03/2022   IR ANGIO VERTEBRAL SEL VERTEBRAL UNI L MOD SED  07/03/2022   LIPOSUCTION WITH LIPOFILLING Right 12/11/2021   Procedure: Fat grafting to right breast from abdomen;  Surgeon: Janne Napoleon, MD;  Location: [facility];  Service: Plastics;  Laterality: Right;   MASTECTOMY W/ SENTINEL NODE BIOPSY Right 12/25/2020   Procedure: RIGHT MASTECTOMY WITH RIGHT AXILLARY SENTINEL LYMPH NODE BIOPSY;  Surgeon: Emelia Loron, MD;  Location: [facility] OR;  Service: General;  Laterality: Right;   MASTOPEXY Left 03/12/2021   Procedure: LEFT MASTOPEXY;  Surgeon: Allena Napoleon, MD;  Location: [facility] OR;  Service: Plastics;  Laterality: Left;   RADIOLOGY WITH ANESTHESIA N/A 07/03/2022   Procedure: IR WITH ANESTHESIA;  Surgeon: Lisbeth Renshaw, MD;  Location: [facility] OR;  Service: Radiology;  Laterality: N/A;   REMOVAL OF BILATERAL TISSUE EXPANDERS WITH PLACEMENT OF BILATERAL BREAST IMPLANTS Right 03/12/2021   Procedure: REMOVAL OF RIGHT TISSUE EXPANDERS WITH PLACEMENT OF RIGHT  BREAST IMPLANTS;  Surgeon: Allena Napoleon, MD;  Location: [facility] OR;  Service: Plastics;  Laterality: Right;   TUBAL LIGATION      I have reviewed the social history and family history with the patient and they are unchanged from previous note.  ALLERGIES:  is allergic to tomato and pollen extract.  MEDICATIONS:  Current Outpatient Medications  Medication Sig Dispense Refill   EMGALITY 120 MG/ML SOAJ Inject 1 mL into the skin every 30 (thirty) days.     niMODipine (NIMOTOP) 30 MG capsule Take 1 capsule (30 mg total) by mouth every 2 (two) hours for 15 days. 180 capsule 0   SUMAtriptan (IMITREX)  50 MG tablet Take 50 mg by mouth every 2 (two) hours as needed for migraine.     tamoxifen (NOLVADEX) 20 MG tablet Take 1 tablet (20 mg total) by mouth daily. 90 tablet 2   Vitamin D, Ergocalciferol, (DRISDOL) 1.25 MG (50000 UNIT) CAPS capsule Take 50,000 Units by mouth once a week.     No current facility-administered medications for this visit.    PHYSICAL EXAMINATION: ECOG PERFORMANCE STATUS: 1 - Symptomatic but completely ambulatory  Vitals:   08/12/22 1347  BP: 122/77  Pulse: 78  Resp: 16  Temp: 99.6 F (37.6 C)  SpO2: 97%   Wt Readings from Last  3 Encounters:  08/12/22 233 lb 6 oz (105.9 kg)  07/03/22 235 lb 7.2 oz (106.8 kg)  01/21/22 240 lb 4.8 oz (109 kg)     GENERAL:alert, no distress and comfortable SKIN: skin color normal, no rashes or significant lesions EYES: normal, Conjunctiva are pink and non-injected, sclera clear  NEURO: alert & oriented x 3 with fluent speech NECK:(-)  supple, thyroid normal size, non-tender, without nodularity LYMPH: (-) no palpable lymphadenopathy in the cervical, axillary  BREAST: Rt breast mastectomy  .and implant no palpable mass breast exam benign. Lt breast no palpable mass breast exam benign.    LABORATORY DATA:  I have reviewed the data as listed    Latest Ref Rng & Units 08/12/2022    1:24 PM 07/03/2022    4:48 AM 07/02/2022    9:58 PM  CBC  WBC 4.0 - 10.5 K/uL 7.9  13.3  11.8   Hemoglobin 12.0 - 15.0 g/dL 9.9  9.7  9.5   Hematocrit 36.0 - 46.0 % 31.4  29.9  30.6   Platelets 150 - 400 K/uL 322  353  381         Latest Ref Rng & Units 07/02/2022    9:58 PM 05/31/2021    9:05 AM 04/19/2021    9:29 AM  CMP  Glucose 70 - 99 mg/dL 161  096  045   BUN 6 - 20 mg/dL 10  12  8    Creatinine 0.44 - 1.00 mg/dL 4.09  8.11  9.14   Sodium 135 - 145 mmol/L 135  138  138   Potassium 3.5 - 5.1 mmol/L 3.2  3.6  3.5   Chloride 98 - 111 mmol/L 105  111  110   CO2 22 - 32 mmol/L 23  21  19    Calcium 8.9 - 10.3 mg/dL 8.0  8.8  8.8   Total Protein 6.5 - 8.1 g/dL  7.0  7.0   Total Bilirubin 0.3 - 1.2 mg/dL  0.3  0.5   Alkaline Phos 38 - 126 U/L  41  40   AST 15 - 41 U/L  14  16   ALT 0 - 44 U/L  9  12       RADIOGRAPHIC STUDIES: I have personally reviewed the radiological images as listed and agreed with the findings in the report. No results found.    No orders of the defined types were placed in this encounter.  All questions were answered. The patient knows to call the clinic with any problems, questions or concerns. No barriers to learning was detected. The total time spent in the  appointment was 25 minutes.     Malachy Mood, MD 08/12/2022   Carolin Coy, CMA, am acting as scribe for Malachy Mood, MD.   I have reviewed the above documentation for accuracy and  completeness, and I agree with the above.     

## 2022-08-13 ENCOUNTER — Telehealth: Payer: Self-pay

## 2022-08-13 NOTE — Telephone Encounter (Addendum)
Called patient and relayed message below as per Dr. Mosetta Putt, patient voiced full understanding. Sent a message to scheduling to set up appointments.   ----- Message from Malachy Mood, MD sent at 08/13/2022 10:44 AM EDT ----- Please let pt know her iron level is very low, and schedule her iv venofer 300mg  [id number] thanks   Malachy Mood

## 2022-08-14 ENCOUNTER — Telehealth: Payer: Self-pay | Admitting: Hematology

## 2022-08-23 MED FILL — Iron Sucrose Inj 20 MG/ML (Fe Equiv): INTRAVENOUS | Qty: 15 | Status: AC

## 2022-08-24 ENCOUNTER — Inpatient Hospital Stay: Payer: No Typology Code available for payment source

## 2022-08-31 ENCOUNTER — Inpatient Hospital Stay: Payer: No Typology Code available for payment source | Attending: Hematology

## 2022-08-31 VITALS — BP 110/79 | HR 85 | Temp 98.5°F | Resp 18

## 2022-08-31 DIAGNOSIS — N920 Excessive and frequent menstruation with regular cycle: Secondary | ICD-10-CM | POA: Insufficient documentation

## 2022-08-31 DIAGNOSIS — Z170 Estrogen receptor positive status [ER+]: Secondary | ICD-10-CM | POA: Diagnosis not present

## 2022-08-31 DIAGNOSIS — C50211 Malignant neoplasm of upper-inner quadrant of right female breast: Secondary | ICD-10-CM | POA: Insufficient documentation

## 2022-08-31 DIAGNOSIS — D500 Iron deficiency anemia secondary to blood loss (chronic): Secondary | ICD-10-CM | POA: Insufficient documentation

## 2022-08-31 MED ORDER — SODIUM CHLORIDE 0.9 % IV SOLN
300.0000 mg | Freq: Once | INTRAVENOUS | Status: AC
  Administered 2022-08-31: 300 mg via INTRAVENOUS
  Filled 2022-08-31: qty 300

## 2022-08-31 MED ORDER — SODIUM CHLORIDE 0.9 % IV SOLN: Freq: Once | INTRAVENOUS | Status: AC

## 2022-08-31 MED ORDER — ACETAMINOPHEN 325 MG PO TABS
650.0000 mg | ORAL_TABLET | Freq: Once | ORAL | Status: AC
  Administered 2022-08-31: 650 mg via ORAL
  Filled 2022-08-31: qty 2

## 2022-08-31 MED ORDER — LORATADINE 10 MG PO TABS
10.0000 mg | ORAL_TABLET | Freq: Once | ORAL | Status: AC
  Administered 2022-08-31: 10 mg via ORAL
  Filled 2022-08-31: qty 1

## 2022-08-31 NOTE — Progress Notes (Unsigned)
Pt refused to stay for ordered 30-minute post observation time following Venofer. VSS. Pt a/o and ambulatory upon d/c.

## 2022-09-04 ENCOUNTER — Other Ambulatory Visit: Payer: Self-pay

## 2022-09-06 MED FILL — Iron Sucrose Inj 20 MG/ML (Fe Equiv): INTRAVENOUS | Qty: 15 | Status: AC

## 2022-09-07 ENCOUNTER — Inpatient Hospital Stay: Payer: No Typology Code available for payment source

## 2022-09-14 ENCOUNTER — Inpatient Hospital Stay: Payer: No Typology Code available for payment source

## 2022-09-14 VITALS — BP 117/76 | HR 72 | Temp 98.9°F | Ht 64.0 in

## 2022-09-14 DIAGNOSIS — D500 Iron deficiency anemia secondary to blood loss (chronic): Secondary | ICD-10-CM

## 2022-09-14 MED ORDER — SODIUM CHLORIDE 0.9 % IV SOLN: Freq: Once | INTRAVENOUS | Status: AC

## 2022-09-14 MED ORDER — LORATADINE 10 MG PO TABS
10.0000 mg | ORAL_TABLET | Freq: Once | ORAL | Status: AC
  Administered 2022-09-14: 10 mg via ORAL
  Filled 2022-09-14: qty 1

## 2022-09-14 MED ORDER — ACETAMINOPHEN 325 MG PO TABS
650.0000 mg | ORAL_TABLET | Freq: Once | ORAL | Status: AC
  Administered 2022-09-14: 650 mg via ORAL
  Filled 2022-09-14: qty 2

## 2022-09-14 MED ORDER — SODIUM CHLORIDE 0.9 % IV SOLN
300.0000 mg | Freq: Once | INTRAVENOUS | Status: AC
  Administered 2022-09-14: 300 mg via INTRAVENOUS
  Filled 2022-09-14: qty 300

## 2022-09-14 NOTE — Progress Notes (Signed)
Patient refused 30 min observation post iron infusion. Discharged in stable condition

## 2022-09-20 MED FILL — Iron Sucrose Inj 20 MG/ML (Fe Equiv): INTRAVENOUS | Qty: 15 | Status: AC

## 2022-09-21 ENCOUNTER — Inpatient Hospital Stay: Payer: No Typology Code available for payment source

## 2022-09-21 VITALS — BP 121/71 | HR 75 | Temp 95.8°F | Resp 18

## 2022-09-21 DIAGNOSIS — D500 Iron deficiency anemia secondary to blood loss (chronic): Secondary | ICD-10-CM

## 2022-09-21 MED ORDER — LORATADINE 10 MG PO TABS
10.0000 mg | ORAL_TABLET | Freq: Once | ORAL | Status: AC
  Administered 2022-09-21: 10 mg via ORAL
  Filled 2022-09-21: qty 1

## 2022-09-21 MED ORDER — SODIUM CHLORIDE 0.9 % IV SOLN
300.0000 mg | Freq: Once | INTRAVENOUS | Status: AC
  Administered 2022-09-21: 300 mg via INTRAVENOUS
  Filled 2022-09-21: qty 300

## 2022-09-21 MED ORDER — ACETAMINOPHEN 325 MG PO TABS
650.0000 mg | ORAL_TABLET | Freq: Once | ORAL | Status: AC
  Administered 2022-09-21: 650 mg via ORAL
  Filled 2022-09-21: qty 2

## 2022-09-21 MED ORDER — SODIUM CHLORIDE 0.9 % IV SOLN: Freq: Once | INTRAVENOUS | Status: AC

## 2022-09-21 NOTE — Progress Notes (Signed)
Pt declined to stay for 30 minute post venofer infusion observation period. Tolerated well with no adverse s/s. VSS at time of discharge. Pt ambulated independently to lobby for discharge.

## 2022-09-21 NOTE — Patient Instructions (Signed)

## 2022-10-11 ENCOUNTER — Encounter: Payer: Self-pay | Admitting: Hematology

## 2022-10-18 ENCOUNTER — Inpatient Hospital Stay: Payer: Self-pay | Attending: Hematology

## 2022-12-13 ENCOUNTER — Encounter: Payer: Self-pay | Admitting: Hematology

## 2022-12-13 ENCOUNTER — Inpatient Hospital Stay: Payer: No Typology Code available for payment source | Attending: Hematology

## 2022-12-31 ENCOUNTER — Other Ambulatory Visit (HOSPITAL_COMMUNITY): Payer: Self-pay

## 2022-12-31 MED ORDER — WEGOVY 0.25 MG/0.5ML ~~LOC~~ SOAJ
0.2500 mg | SUBCUTANEOUS | 0 refills | Status: DC
  Filled 2022-12-31 (×2): qty 2, 28d supply, fill #0

## 2023-01-01 ENCOUNTER — Other Ambulatory Visit (HOSPITAL_COMMUNITY): Payer: Self-pay

## 2023-01-30 ENCOUNTER — Other Ambulatory Visit (HOSPITAL_BASED_OUTPATIENT_CLINIC_OR_DEPARTMENT_OTHER): Payer: Self-pay

## 2023-01-30 ENCOUNTER — Other Ambulatory Visit (HOSPITAL_COMMUNITY): Payer: Self-pay

## 2023-01-30 MED ORDER — WEGOVY 0.5 MG/0.5ML ~~LOC~~ SOAJ
0.5000 mg | SUBCUTANEOUS | 0 refills | Status: DC
  Filled 2023-01-30: qty 2, 28d supply, fill #0

## 2023-02-09 NOTE — Assessment & Plan Note (Signed)
ductal carcinoma, pT2N83miM0, Stage IB, ER+/PR+/HER2-, Grade 2, Oncotype RS 19  -presented with a palpable right breast mass. Oncotype on biopsy RS of 19, low risk. -s/p right mastectomy by Dr. Dwain Sarna and tissue expander placement by Dr. Arita Miss on 12/25/20. Pathology showed: IDC with lobular features, grade 2, spanning 3.1 cm; intermediate grade DCIS; margins and lymph nodes negative except one node containing isolated tumor cells -she started tamoxifen on 10/18/20. Tolerating well with hot flashes.  -most recent left mammogram on 08/30/21 was negative. Given concern for enhancement seen on MRI, biopsy was performed on 09/05/21, path was benign. -I previously discussed ovarian suppression, mainly in light of her very heavy periods. She can consider Zoladex injections or BSO. I explained these would put her into early menopause. I noted the benefit of the Zoladex is that it's reversible. I encouraged her to discuss with her PCP/GYN. She will think about it.

## 2023-02-10 ENCOUNTER — Ambulatory Visit
Admission: RE | Admit: 2023-02-10 | Discharge: 2023-02-10 | Disposition: A | Discharge status: Home / Self Care | Payer: No Typology Code available for payment source | Source: Ambulatory Visit | Attending: Hematology | Admitting: Hematology

## 2023-02-10 ENCOUNTER — Inpatient Hospital Stay (HOSPITAL_BASED_OUTPATIENT_CLINIC_OR_DEPARTMENT_OTHER): Payer: No Typology Code available for payment source | Admitting: Hematology

## 2023-02-10 ENCOUNTER — Other Ambulatory Visit: Payer: Self-pay

## 2023-02-10 ENCOUNTER — Inpatient Hospital Stay: Payer: No Typology Code available for payment source | Attending: Hematology

## 2023-02-10 VITALS — BP 129/80 | HR 80 | Temp 97.9°F | Resp 18 | Wt 220.9 lb

## 2023-02-10 DIAGNOSIS — Z8042 Family history of malignant neoplasm of prostate: Secondary | ICD-10-CM | POA: Diagnosis not present

## 2023-02-10 DIAGNOSIS — Z9011 Acquired absence of right breast and nipple: Secondary | ICD-10-CM | POA: Insufficient documentation

## 2023-02-10 DIAGNOSIS — D500 Iron deficiency anemia secondary to blood loss (chronic): Secondary | ICD-10-CM

## 2023-02-10 DIAGNOSIS — R232 Flushing: Secondary | ICD-10-CM | POA: Insufficient documentation

## 2023-02-10 DIAGNOSIS — N920 Excessive and frequent menstruation with regular cycle: Secondary | ICD-10-CM | POA: Diagnosis not present

## 2023-02-10 DIAGNOSIS — Z1721 Progesterone receptor positive status: Secondary | ICD-10-CM | POA: Insufficient documentation

## 2023-02-10 DIAGNOSIS — D509 Iron deficiency anemia, unspecified: Secondary | ICD-10-CM | POA: Insufficient documentation

## 2023-02-10 DIAGNOSIS — Z1211 Encounter for screening for malignant neoplasm of colon: Secondary | ICD-10-CM

## 2023-02-10 DIAGNOSIS — Z79810 Long term (current) use of selective estrogen receptor modulators (SERMs): Secondary | ICD-10-CM | POA: Diagnosis not present

## 2023-02-10 DIAGNOSIS — Z170 Estrogen receptor positive status [ER+]: Secondary | ICD-10-CM

## 2023-02-10 DIAGNOSIS — C50211 Malignant neoplasm of upper-inner quadrant of right female breast: Secondary | ICD-10-CM

## 2023-02-10 LAB — CBC WITH DIFFERENTIAL (CANCER CENTER ONLY)
Abs Immature Granulocytes: 0.03 10*3/uL (ref 0.00–0.07)
Basophils Absolute: 0 10*3/uL (ref 0.0–0.1)
Basophils Relative: 0 %
Eosinophils Absolute: 0.2 10*3/uL (ref 0.0–0.5)
Eosinophils Relative: 3 %
HCT: 29.3 % — ABNORMAL LOW (ref 36.0–46.0)
Hemoglobin: 8.7 g/dL — ABNORMAL LOW (ref 12.0–15.0)
Immature Granulocytes: 0 %
Lymphocytes Relative: 26 %
Lymphs Abs: 2.5 10*3/uL (ref 0.7–4.0)
MCH: 21.2 pg — ABNORMAL LOW (ref 26.0–34.0)
MCHC: 29.7 g/dL — ABNORMAL LOW (ref 30.0–36.0)
MCV: 71.3 fL — ABNORMAL LOW (ref 80.0–100.0)
Monocytes Absolute: 0.6 10*3/uL (ref 0.1–1.0)
Monocytes Relative: 7 %
Neutro Abs: 6.2 10*3/uL (ref 1.7–7.7)
Neutrophils Relative %: 64 %
Platelet Count: 513 10*3/uL — ABNORMAL HIGH (ref 150–400)
RBC: 4.11 MIL/uL (ref 3.87–5.11)
RDW: 17.2 % — ABNORMAL HIGH (ref 11.5–15.5)
WBC Count: 9.6 10*3/uL (ref 4.0–10.5)
nRBC: 0 % (ref 0.0–0.2)

## 2023-02-10 LAB — FERRITIN: Ferritin: 3 ng/mL — ABNORMAL LOW (ref 11–307)

## 2023-02-10 MED ORDER — ACCRUFER 30 MG PO CAPS: 30.0000 mg | ORAL_CAPSULE | Freq: Every day | ORAL | 5 refills | Status: DC

## 2023-02-10 NOTE — Progress Notes (Signed)
[facility]   Telephone:(336) [phone] Fax:(336) [phone]   Clinic Follow up Note   Patient Care Team: Soundra Pilon, FNP as PCP - General (Family Medicine) Pershing Proud, RN as Oncology Nurse Navigator Donnelly Angelica, RN as Oncology Nurse Navigator Emelia Loron, MD as Consulting Physician (General Surgery) Malachy Mood, MD as Consulting Physician (Hematology) Antony Blackbird, MD as Consulting Physician (Radiation Oncology)  Date of Service:  02/10/2023  CHIEF COMPLAINT: f/u of breast cancer and anemia  CURRENT THERAPY:  Tamoxifen IV iron as needed  Oncology History   Malignant neoplasm of upper-inner quadrant of right breast in female, estrogen receptor positive (HCC)  ductal carcinoma, [id number], Stage IB, ER+/PR+/HER2-, Grade 2, Oncotype RS 19  -presented with a palpable right breast mass. Oncotype on biopsy RS of 19, low risk. -s/p right mastectomy by Dr. Dwain Sarna and tissue expander placement by Dr. Arita Miss on 12/25/20. Pathology showed: IDC with lobular features, grade 2, spanning 3.1 cm; intermediate grade DCIS; margins and lymph nodes negative except one node containing isolated tumor cells -she started tamoxifen on 10/18/20. Tolerating well with hot flashes.  -most recent left mammogram on 08/30/21 was negative. Given concern for enhancement seen on MRI, biopsy was performed on 09/05/21, path was benign. -I previously discussed ovarian suppression, mainly in light of her very heavy periods. She can consider Zoladex injections or BSO. I explained these would put her into early menopause. I noted the benefit of the Zoladex is that it's reversible. I encouraged her to discuss with her PCP/GYN. She will think about it.    Assessment and Plan    Breast Cancer Follow-up 47 year old with breast cancer diagnosed in 2022, status post lumpectomy. Currently on tamoxifen with manageable hot flashes. Overdue for a diagnostic mammogram. Discussed the importance of timely  mammograms for early detection of recurrence. Prefers to continue tamoxifen despite hot flashes. - Order screening mammogram for left breast - Ensure mammogram order is placed at the breast center -Patient will call and schedule mammogram  Anemia Chronic iron deficiency anemia with hemoglobin at 8.7 g/dL. Heavy menstrual bleeding contributing to anemia. Last IV iron infusion earlier this year. Ferritin levels pending. Discussed need for more frequent iron infusions until hysterectomy. Explained iron levels may take 6-12 months to normalize post-surgery. - Schedule Venofer 300 mg IV infusion for three sessions - Follow-up lab work in four months - Check blood counts before planned hysterectomy early next year - Prescribe iron supplement with minimal GI side effects - Provide information on over-the-counter iron supplement if prescription not covered  Heavy Menstrual Bleeding Persistent heavy menstrual bleeding requiring both tampon and pad. Hysterectomy planned but delayed due to work constraints. Discussed impact of heavy bleeding on anemia and importance of surgery for long-term resolution. - Plan for hysterectomy early next year - Monitor blood counts and iron levels closely until surgery  General Health Maintenance On Wegovy and a 1200 calorie diet for weight loss. Engaging in exercise. Discussed benefits of weight loss and exercise on overall health and immune function. - Continue Wegovy and 1200 calorie diet - Encourage regular exercise - Add kidney and liver function tests to next lab work  Follow-up - Schedule follow-up visit in four months -lab every 2 months -Will schedule IV Venofer 300 mg weekly x 3        SUMMARY OF ONCOLOGIC HISTORY: Oncology History Overview Note  Cancer Staging Malignant neoplasm of upper-inner quadrant of right breast in female, estrogen receptor positive (HCC) Staging form: Breast, AJCC  8th Edition - Clinical stage from 10/06/2020: Stage IB (cT2,  cN0, cM0, G2, ER+, PR+, HER2-) - Signed by Malachy Mood, MD on 10/17/2020 Stage prefix: Initial diagnosis Histologic grading system: 3 grade system - Pathologic stage from 12/25/2020: No Stage Recommended (ypT2, pN26mi, cM0, G2, ER+, PR+, HER2-) - Signed by Malachy Mood, MD on 01/22/2021 Stage prefix: Post-therapy Histologic grading system: 3 grade system Residual tumor (R): R0 - None    Malignant neoplasm of upper-inner quadrant of right breast in female, estrogen receptor positive (HCC)  10/06/2020 Mammogram   DIGITAL DIAGNOSTIC BILATERAL MAMMOGRAM WITH TOMOSYNTHESIS AND CAD; ULTRASOUND RIGHT BREAST LIMITED  IMPRESSION: Two contiguous hypoechoic masses in the right breast- an irregular hypoechoic mass in the right breast at 12:30 3 cm from the nipple measuring 2.5 x 2.1 x 3.0 cm, and a hypoechoic heterogeneous mass at 12:30 2 cm from the nipple measuring 3.0 x 1.6 x 2.8 cm. Sonographic evaluation of the right axilla does not show any enlarged adenopathy.   10/06/2020 Pathology Results   Diagnosis Breast, right, needle core biopsy - INVASIVE MAMMARY CARCINOMA WITH LOBULAR FEATURES - SEE COMMENT Microscopic Comment The biopsy material shows an infiltrative proliferation of cells with large vesicular nuclei with inconspicuous nucleoli, arranged linearly and in small clusters. Based on the biopsy, the carcinoma appears Nottingham grade 2 of 3 and measures 0.9 cm in greatest linear extent  E-cadherin is POSITIVE supporting a ductal origin.  PROGNOSTIC INDICATORS Results: IMMUNOHISTOCHEMICAL AND MORPHOMETRIC ANALYSIS PERFORMED MANUALLY The tumor cells are NEGATIVE for Her2 (1+). Estrogen Receptor: 80%, POSITIVE, STRONG STAINING INTENSITY Progesterone Receptor: 90%, POSITIVE, STRONG STAINING INTENSITY Proliferation Marker Ki67: 10%   10/06/2020 Cancer Staging   Staging form: Breast, AJCC 8th Edition - Clinical stage from 10/06/2020: Stage IB (cT2, cN0, cM0, G2, ER+, PR+, HER2-) - Signed by Malachy Mood,  MD on 10/17/2020 Stage prefix: Initial diagnosis Histologic grading system: 3 grade system   10/06/2020 Oncotype testing   RS of 19, distant recurrence risk of 6%, chemotherapy not recommended   10/13/2020 Initial Diagnosis   Malignant neoplasm of upper-inner quadrant of right breast in female, estrogen receptor positive (HCC)   10/24/2020 Genetic Testing   Negative hereditary cancer genetic testing: no pathogenic variants detected in [facility] BRCAPlus Panel and [employer] CancerNext-Expanded +RNAinsight.  Variants of uncertain significance detected in FH at p.Y2H (c.4T>C) and SUFU at  p.A138T (c.412G>A). The report dates are October 24, 2020 and October 27, 2020, respectively.    FH p.Y2H (c.4T>C) variant of uncertain significance was reclassified to "likely benign". Report date is 03/15/2021. SUFU p.A138T (c.412G>A) variant of uncertain significance was reclassified to "benign". Report date is 10/05/2021.  The BRCAplus panel offered by [employer] and includes sequencing and deletion/duplication analysis for the following 8 genes: ATM, BRCA1, BRCA2, CDH1, CHEK2, PALB2, PTEN, and TP53.  The CancerNext-Expanded gene panel offered by [facility] and includes sequencing, rearrangement, and RNA analysis for the following 77 genes: AIP, ALK, APC, ATM, AXIN2, BAP1, BARD1, BLM, BMPR1A, BRCA1, BRCA2, BRIP1, CDC73, CDH1, CDK4, CDKN1B, CDKN2A, CHEK2, CTNNA1, DICER1, FANCC, FH, FLCN, GALNT12, KIF1B, LZTR1, MAX, MEN1, MET, MLH1, MSH2, MSH3, MSH6, MUTYH, NBN, NF1, NF2, NTHL1, PALB2, PHOX2B, PMS2, POT1, PRKAR1A, PTCH1, PTEN, RAD51C, RAD51D, RB1, RECQL, RET, SDHA, SDHAF2, SDHB, SDHC, SDHD, SMAD4, SMARCA4, SMARCB1, SMARCE1, STK11, SUFU, TMEM127, TP53, TSC1, TSC2, VHL and XRCC2 (sequencing and deletion/duplication); EGFR, EGLN1, HOXB13, KIT, MITF, PDGFRA, POLD1, and POLE (sequencing only); EPCAM and GREM1 (deletion/duplication only).     11/09/2020 Imaging   Breast MRI  IMPRESSION: 1. Known biopsy-proven invasive carcinoma  within the upper inner quadrant of the RIGHT breast, measuring 3.6 cm, containing a biopsy clip. Contiguous linear non-mass enhancement extends superior-lateral to the spiculated mass, increasing overall craniocaudal dimension to 3 cm. 2. Additional irregular enhancing mass within the inner RIGHT breast, 3 o'clock axis region, at anterior depth, measuring 7 mm, with suspicious washout kinetics. MRI-guided biopsy is recommended to exclude multifocal disease. 3. Additional linear clumped non-mass enhancement within the upper inner quadrant of the LEFT breast, at posterior depth, measuring 1.8 cm extent, with suspicious washout kinetics. This may represent an intramammary lymph node. MRI-guided biopsy is recommended to exclude contralateral disease.   12/25/2020 Cancer Staging   Staging form: Breast, AJCC 8th Edition - Pathologic stage from 12/25/2020: No Stage Recommended (ypT2, pN3mi, cM0, G2, ER+, PR+, HER2-) - Signed by Malachy Mood, MD on 01/22/2021 Stage prefix: Post-therapy Histologic grading system: 3 grade system Residual tumor (R): R0 - None   12/25/2020 Definitive Surgery   FINAL MICROSCOPIC DIAGNOSIS:   A. BREAST, RIGHT, MASTECTOMY:  - Invasive ductal carcinoma with lobular features, grade 2, spanning 3.1 cm.  - Intermediate grade ductal carcinoma in situ.  - Margins are negative for carcinoma.  - Lymphovascular invasion.  - Biopsy site.  - Fibroadenomatoid change.  - Pseudoangiomatous stromal hyperplasia.  - Intraductal papilloma.  - See oncology table.   B. LYMPH NODE, RIGHT AXILLARY, SENTINEL EXCISION:  - Isolated tumor cells in one of one lymph node (0/1).   C. LYMPH NODE, RIGHT AXILLARY, SENTINEL EXCISION:  - One of one lymph nodes negative for carcinoma. (0/1).   04/19/2021 Survivorship   SCP delivered by Santiago Glad, NP      Discussed the use of AI scribe software for clinical note transcription with the patient, who gave verbal consent to proceed.  History of  Present Illness   The patient, a 47 year old female with a history of breast cancer and anemia, presents for a routine follow-up. She reports no new health issues and is generally feeling well. She is currently trying to lose weight and has been prescribed Wegovy along with a 1200 calorie diet. She reports some weight loss and a change in how her clothes fit, despite the scale reading remaining the same. She is also incorporating exercise into her routine.  The patient is currently on tamoxifen for her breast cancer and reports experiencing hot flashes, which she has learned to manage. She had her last mammogram last year and is overdue for her current year mammogram. She also reports heavy menstrual periods, requiring both a tampon and a pad to prevent staining her clothes. She was scheduled for a hysterectomy this month but had to postpone it due to work commitments. She plans to reschedule it for early next year.  The patient also reports a history of a brain bleed earlier this year, which has affected her work schedule. Her current blood count is low at 8.7, and she has previously received IV iron infusions, which she reports helped. She has not received an infusion in a while, and her doctor recommends more frequent infusions until her hysterectomy.         All other systems were reviewed with the patient and are negative.  MEDICAL HISTORY:  Past Medical History:  Diagnosis Date   Anemia    Anxiety    Breast cancer (HCC)    right   Depression    Family history of prostate cancer 10/18/2020   Headache    migraines  Hx of migraines    Pre-diabetes    Seasonal allergic reaction     SURGICAL HISTORY: Past Surgical History:  Procedure Laterality Date   BREAST RECONSTRUCTION WITH PLACEMENT OF TISSUE EXPANDER AND FLEX HD (ACELLULAR HYDRATED DERMIS) Right 12/25/2020   Procedure: RIGHT BREAST RECONSTRUCTION WITH PLACEMENT OF TISSUE EXPANDER AND FLEX HD (ACELLULAR HYDRATED DERMIS);   Surgeon: Allena Napoleon, MD;  Location: [facility] OR;  Service: Plastics;  Laterality: Right;   IR ANGIO EXTERNAL CAROTID SEL EXT CAROTID BILAT MOD SED  07/03/2022   IR ANGIO INTRA EXTRACRAN SEL INTERNAL CAROTID BILAT MOD SED  07/03/2022   IR ANGIO VERTEBRAL SEL VERTEBRAL UNI L MOD SED  07/03/2022   LIPOSUCTION WITH LIPOFILLING Right 12/11/2021   Procedure: Fat grafting to right breast from abdomen;  Surgeon: Janne Napoleon, MD;  Location: [facility];  Service: Plastics;  Laterality: Right;   MASTECTOMY W/ SENTINEL NODE BIOPSY Right 12/25/2020   Procedure: RIGHT MASTECTOMY WITH RIGHT AXILLARY SENTINEL LYMPH NODE BIOPSY;  Surgeon: Emelia Loron, MD;  Location: [facility] OR;  Service: General;  Laterality: Right;   MASTOPEXY Left 03/12/2021   Procedure: LEFT MASTOPEXY;  Surgeon: Allena Napoleon, MD;  Location: [facility] OR;  Service: Plastics;  Laterality: Left;   RADIOLOGY WITH ANESTHESIA N/A 07/03/2022   Procedure: IR WITH ANESTHESIA;  Surgeon: Lisbeth Renshaw, MD;  Location: [facility] OR;  Service: Radiology;  Laterality: N/A;   REMOVAL OF BILATERAL TISSUE EXPANDERS WITH PLACEMENT OF BILATERAL BREAST IMPLANTS Right 03/12/2021   Procedure: REMOVAL OF RIGHT TISSUE EXPANDERS WITH PLACEMENT OF RIGHT  BREAST IMPLANTS;  Surgeon: Allena Napoleon, MD;  Location: [facility] OR;  Service: Plastics;  Laterality: Right;   TUBAL LIGATION      I have reviewed the social history and family history with the patient and they are unchanged from previous note.  ALLERGIES:  is allergic to tomato and pollen extract.  MEDICATIONS:  Current Outpatient Medications  Medication Sig Dispense Refill   Ferric Maltol (ACCRUFER) 30 MG CAPS Take 1 capsule (30 mg total) by mouth daily at 2 PM. 30 capsule 5   EMGALITY 120 MG/ML SOAJ Inject 1 mL into the skin every 30 (thirty) days.     fluconazole (DIFLUCAN) 150 MG tablet SMARTSIG:1 Tablet(s) By Mouth     Semaglutide-Weight Management (WEGOVY) 0.25 MG/0.5ML SOAJ Inject 0.25 mg into the skin  once a week. 2 mL 0   Semaglutide-Weight Management (WEGOVY) 0.5 MG/0.5ML SOAJ Inject 0.5 mg into the skin once a week. 2 mL 0   SUMAtriptan (IMITREX) 50 MG tablet Take 50 mg by mouth every 2 (two) hours as needed for migraine.     tamoxifen (NOLVADEX) 20 MG tablet Take 1 tablet (20 mg total) by mouth daily. 90 tablet 2   topiramate (TOPAMAX) 25 MG tablet Take 25 mg by mouth 2 (two) times daily.     Vitamin D, Ergocalciferol, (DRISDOL) 1.25 MG (50000 UNIT) CAPS capsule Take 50,000 Units by mouth once a week.     No current facility-administered medications for this visit.    PHYSICAL EXAMINATION: ECOG PERFORMANCE STATUS: 0 - Asymptomatic  Vitals:   02/10/23 1356  BP: 129/80  Pulse: 80  Resp: 18  Temp: 97.9 F (36.6 C)  SpO2: 100%   Wt Readings from Last 3 Encounters:  02/10/23 220 lb 14.4 oz (100.2 kg)  08/12/22 233 lb 6 oz (105.9 kg)  07/03/22 235 lb 7.2 oz (106.8 kg)     GENERAL:alert, no distress and comfortable SKIN: skin  color, texture, turgor are normal, no rashes or significant lesions EYES: normal, Conjunctiva are pink and non-injected, sclera clear NECK: supple, thyroid normal size, non-tender, without nodularity LYMPH:  no palpable lymphadenopathy in the cervical, axillary  LUNGS: clear to auscultation and percussion with normal breathing effort HEART: regular rate & rhythm and no murmurs and no lower extremity edema ABDOMEN:abdomen soft, non-tender and normal bowel sounds Musculoskeletal:no cyanosis of digits and no clubbing  NEURO: alert & oriented x 3 with fluent speech, no focal motor/sensory deficits Breasts: Breast inspection showed status post right mastectomy with implant reconstruction, no palpable mass around the implant.  Palpation of the left breast and axilla revealed no obvious mass that I could appreciate.   LABORATORY DATA:  I have reviewed the data as listed    Latest Ref Rng & Units 02/10/2023    1:40 PM 08/12/2022    1:24 PM 07/03/2022    4:48  AM  CBC  WBC 4.0 - 10.5 K/uL 9.6  7.9  13.3   Hemoglobin 12.0 - 15.0 g/dL 8.7  9.9  9.7   Hematocrit 36.0 - 46.0 % 29.3  31.4  29.9   Platelets 150 - 400 K/uL 513  322  353         Latest Ref Rng & Units 07/02/2022    9:58 PM 05/31/2021    9:05 AM 04/19/2021    9:29 AM  CMP  Glucose 70 - 99 mg/dL 425  956  387   BUN 6 - 20 mg/dL 10  12  8    Creatinine 0.44 - 1.00 mg/dL 5.64  3.32  9.51   Sodium 135 - 145 mmol/L 135  138  138   Potassium 3.5 - 5.1 mmol/L 3.2  3.6  3.5   Chloride 98 - 111 mmol/L 105  111  110   CO2 22 - 32 mmol/L 23  21  19    Calcium 8.9 - 10.3 mg/dL 8.0  8.8  8.8   Total Protein 6.5 - 8.1 g/dL  7.0  7.0   Total Bilirubin 0.3 - 1.2 mg/dL  0.3  0.5   Alkaline Phos 38 - 126 U/L  41  40   AST 15 - 41 U/L  14  16   ALT 0 - 44 U/L  9  12       RADIOGRAPHIC STUDIES: I have personally reviewed the radiological images as listed and agreed with the findings in the report. No results found.    Orders Placed This Encounter  Procedures   MM 3D SCREENING MAMMOGRAM UNILATERAL LEFT BREAST    INS//AET[employer]  ANNUAL// 08/30/2021 BCG  PF? NO PROBLEMS; NO NEEDS; MASTECTOMY AND IMPLANT ON R SIDE,REDUCTION ON THE L BREAST ; PT HAS PERSONAL HX OF BR CA PT AWARE OF $75 NO SHOW/CX FEE    Standing Status:   Future    Number of Occurrences:   1    Standing Expiration Date:   02/10/2024    Order Specific Question:   Reason for Exam (SYMPTOM  OR DIAGNOSIS REQUIRED)    Answer:   screening    Order Specific Question:   Is the patient pregnant?    Answer:   No    Order Specific Question:   Preferred imaging location?    Answer:   [facility]   Comprehensive metabolic panel    Standing Status:   Standing    Number of Occurrences:   50    Standing Expiration Date:   02/10/2024  All questions were answered. The patient knows to call the clinic with any problems, questions or concerns. No barriers to learning was detected. The total time spent in the appointment was 25 minutes.      Malachy Mood, MD 02/10/2023

## 2023-02-11 ENCOUNTER — Other Ambulatory Visit: Payer: Self-pay

## 2023-02-11 ENCOUNTER — Telehealth: Payer: Self-pay

## 2023-02-11 NOTE — Progress Notes (Signed)
Verbal order w/readback from Dr. Mosetta Putt for IV Venofer 200mg  IVP d/t NS shortage to be done at [facility].

## 2023-02-11 NOTE — Telephone Encounter (Signed)
Hello, Patient will be scheduled as soon as possible.  Auth Submission: NO AUTH NEEDED Site of care: Site of care: [facility] Payer: [employer] Medication & CPT/J Code(s) submitted: Venofer (Iron Sucrose) J1756 Route of submission (phone, fax, portal): portal Phone # Fax # Auth type: Buy/Bill PB Units/visits requested: 5 doses, 200mg  Reference number:  Approval from: 02/11/23 to 04/01/23

## 2023-02-12 ENCOUNTER — Other Ambulatory Visit: Payer: Self-pay | Admitting: Hematology

## 2023-02-18 ENCOUNTER — Ambulatory Visit: Payer: No Typology Code available for payment source | Admitting: *Deleted

## 2023-02-18 VITALS — BP 108/74 | HR 86 | Temp 98.9°F | Resp 16 | Ht 64.0 in | Wt 222.8 lb

## 2023-02-18 DIAGNOSIS — D500 Iron deficiency anemia secondary to blood loss (chronic): Secondary | ICD-10-CM | POA: Diagnosis not present

## 2023-02-18 DIAGNOSIS — N920 Excessive and frequent menstruation with regular cycle: Secondary | ICD-10-CM

## 2023-02-18 DIAGNOSIS — Z170 Estrogen receptor positive status [ER+]: Secondary | ICD-10-CM

## 2023-02-18 MED ORDER — IRON SUCROSE 20 MG/ML IV SOLN
200.0000 mg | Freq: Once | INTRAVENOUS | Status: AC
  Administered 2023-02-18: 200 mg via INTRAVENOUS
  Filled 2023-02-18: qty 10

## 2023-02-18 MED ORDER — ACETAMINOPHEN 325 MG PO TABS
650.0000 mg | ORAL_TABLET | Freq: Once | ORAL | Status: AC
Start: 2023-02-18 — End: 2023-02-18
  Administered 2023-02-18: 650 mg via ORAL
  Filled 2023-02-18: qty 2

## 2023-02-18 MED ORDER — DIPHENHYDRAMINE HCL 25 MG PO CAPS
25.0000 mg | ORAL_CAPSULE | Freq: Once | ORAL | Status: AC
  Administered 2023-02-18: 25 mg via ORAL
  Filled 2023-02-18: qty 1

## 2023-02-18 NOTE — Progress Notes (Signed)
 Diagnosis: Iron Deficiency Anemia  Provider:  Chilton Greathouse MD  Procedure: IV Push  IV Type: Peripheral, IV Location: L Antecubital  Venofer (Iron Sucrose), Dose: 200 mg  Post Infusion IV Care: Observation period completed and Peripheral IV Discontinued  Discharge: Condition: Good, Destination: Home . AVS Declined  Performed by:  Forrest Moron, RN

## 2023-02-19 ENCOUNTER — Inpatient Hospital Stay (HOSPITAL_COMMUNITY): Admit: 2023-02-19 | Payer: No Typology Code available for payment source | Admitting: Obstetrics and Gynecology

## 2023-02-19 ENCOUNTER — Other Ambulatory Visit (HOSPITAL_COMMUNITY): Payer: Self-pay

## 2023-02-19 SURGERY — HYSTERECTOMY, TOTAL, ABDOMINAL, WITH SALPINGECTOMY
Anesthesia: Choice | Laterality: Bilateral

## 2023-02-19 MED ORDER — WEGOVY 1 MG/0.5ML ~~LOC~~ SOAJ
1.0000 mg | SUBCUTANEOUS | 0 refills | Status: DC
  Filled 2023-02-19: qty 2, 28d supply, fill #0

## 2023-02-20 ENCOUNTER — Ambulatory Visit: Payer: No Typology Code available for payment source

## 2023-02-20 VITALS — BP 102/70 | HR 87 | Temp 99.0°F | Resp 18 | Ht 64.0 in | Wt 220.2 lb

## 2023-02-20 DIAGNOSIS — N920 Excessive and frequent menstruation with regular cycle: Secondary | ICD-10-CM | POA: Diagnosis not present

## 2023-02-20 DIAGNOSIS — C50211 Malignant neoplasm of upper-inner quadrant of right female breast: Secondary | ICD-10-CM

## 2023-02-20 DIAGNOSIS — D500 Iron deficiency anemia secondary to blood loss (chronic): Secondary | ICD-10-CM | POA: Diagnosis not present

## 2023-02-20 MED ORDER — ACETAMINOPHEN 325 MG PO TABS: 650.0000 mg | ORAL_TABLET | Freq: Once | ORAL | Status: DC

## 2023-02-20 MED ORDER — IRON SUCROSE 20 MG/ML IV SOLN
200.0000 mg | Freq: Once | INTRAVENOUS | Status: AC
Start: 2023-02-20 — End: 2023-02-20
  Administered 2023-02-20: 200 mg via INTRAVENOUS
  Filled 2023-02-20: qty 10

## 2023-02-20 MED ORDER — DIPHENHYDRAMINE HCL 25 MG PO CAPS: 25.0000 mg | ORAL_CAPSULE | Freq: Once | ORAL | Status: DC

## 2023-02-20 NOTE — Progress Notes (Signed)
 Diagnosis: Iron Deficiency Anemia  Provider:  Chilton Greathouse MD  Procedure: IV Push  IV Type: Peripheral, IV Location: L Antecubital  Venofer (Iron Sucrose), Dose: 200 mg  Post Infusion IV Care: Patient declined observation and Peripheral IV Discontinued  Discharge: Condition: Good, Destination: Home . AVS Declined  Performed by:  Loney Hering, LPN

## 2023-02-24 ENCOUNTER — Ambulatory Visit: Payer: No Typology Code available for payment source

## 2023-02-24 VITALS — BP 107/72 | HR 86 | Temp 98.5°F | Resp 18 | Ht 64.0 in | Wt 227.6 lb

## 2023-02-24 DIAGNOSIS — N920 Excessive and frequent menstruation with regular cycle: Secondary | ICD-10-CM | POA: Diagnosis not present

## 2023-02-24 DIAGNOSIS — D500 Iron deficiency anemia secondary to blood loss (chronic): Secondary | ICD-10-CM | POA: Diagnosis not present

## 2023-02-24 DIAGNOSIS — Z170 Estrogen receptor positive status [ER+]: Secondary | ICD-10-CM

## 2023-02-24 MED ORDER — DIPHENHYDRAMINE HCL 25 MG PO CAPS: 25.0000 mg | ORAL_CAPSULE | Freq: Once | ORAL | Status: DC

## 2023-02-24 MED ORDER — ACETAMINOPHEN 325 MG PO TABS: 650.0000 mg | ORAL_TABLET | Freq: Once | ORAL | Status: DC

## 2023-02-24 MED ORDER — IRON SUCROSE 20 MG/ML IV SOLN
200.0000 mg | Freq: Once | INTRAVENOUS | Status: AC
  Administered 2023-02-24: 200 mg via INTRAVENOUS
  Filled 2023-02-24: qty 10

## 2023-02-24 NOTE — Progress Notes (Signed)
Diagnosis: Iron Deficiency Anemia  Provider:  Chilton Greathouse MD  Procedure: IV Push  IV Type: Peripheral, IV Location: L Antecubital  Venofer (Iron Sucrose), Dose: 200 mg  Infusion Start Time: 1540    Post Infusion IV Care: Patient declined observation and Peripheral IV Discontinued  Discharge: Condition: Good, Destination: Home . AVS Declined  Performed by:  Garnette Czech, RN

## 2023-02-26 ENCOUNTER — Ambulatory Visit: Payer: No Typology Code available for payment source

## 2023-02-26 MED ORDER — DIPHENHYDRAMINE HCL 25 MG PO CAPS: 25.0000 mg | ORAL_CAPSULE | Freq: Once | ORAL | Status: DC

## 2023-02-26 MED ORDER — IRON SUCROSE 20 MG/ML IV SOLN: 200.0000 mg | Freq: Once | INTRAVENOUS | Status: DC

## 2023-02-26 MED ORDER — ACETAMINOPHEN 325 MG PO TABS: 650.0000 mg | ORAL_TABLET | Freq: Once | ORAL | Status: DC

## 2023-03-04 ENCOUNTER — Ambulatory Visit: Payer: No Typology Code available for payment source

## 2023-03-04 MED ORDER — IRON SUCROSE 20 MG/ML IV SOLN: 200.0000 mg | Freq: Once | INTRAVENOUS | Status: DC

## 2023-03-04 MED ORDER — ACETAMINOPHEN 325 MG PO TABS: 650.0000 mg | ORAL_TABLET | Freq: Once | ORAL | Status: DC

## 2023-03-04 MED ORDER — DIPHENHYDRAMINE HCL 25 MG PO CAPS: 25.0000 mg | ORAL_CAPSULE | Freq: Once | ORAL | Status: DC

## 2023-03-05 ENCOUNTER — Other Ambulatory Visit: Payer: Self-pay

## 2023-03-31 ENCOUNTER — Other Ambulatory Visit (HOSPITAL_COMMUNITY): Payer: Self-pay

## 2023-03-31 MED ORDER — WEGOVY 1.7 MG/0.75ML ~~LOC~~ SOAJ
1.7000 mg | SUBCUTANEOUS | 0 refills | Status: DC
  Filled 2023-03-31: qty 3, 28d supply, fill #0

## 2023-04-07 ENCOUNTER — Inpatient Hospital Stay: Payer: No Typology Code available for payment source | Attending: Hematology

## 2023-04-07 ENCOUNTER — Other Ambulatory Visit: Payer: Self-pay

## 2023-04-07 DIAGNOSIS — Z170 Estrogen receptor positive status [ER+]: Secondary | ICD-10-CM | POA: Insufficient documentation

## 2023-04-07 DIAGNOSIS — Z1732 Human epidermal growth factor receptor 2 negative status: Secondary | ICD-10-CM | POA: Diagnosis not present

## 2023-04-07 DIAGNOSIS — Z79810 Long term (current) use of selective estrogen receptor modulators (SERMs): Secondary | ICD-10-CM | POA: Insufficient documentation

## 2023-04-07 DIAGNOSIS — D500 Iron deficiency anemia secondary to blood loss (chronic): Secondary | ICD-10-CM | POA: Diagnosis not present

## 2023-04-07 DIAGNOSIS — Z1721 Progesterone receptor positive status: Secondary | ICD-10-CM | POA: Insufficient documentation

## 2023-04-07 DIAGNOSIS — N920 Excessive and frequent menstruation with regular cycle: Secondary | ICD-10-CM | POA: Insufficient documentation

## 2023-04-07 DIAGNOSIS — C50211 Malignant neoplasm of upper-inner quadrant of right female breast: Secondary | ICD-10-CM | POA: Diagnosis present

## 2023-04-07 LAB — CBC WITH DIFFERENTIAL (CANCER CENTER ONLY)
Abs Immature Granulocytes: 0.04 10*3/uL (ref 0.00–0.07)
Basophils Absolute: 0.1 10*3/uL (ref 0.0–0.1)
Basophils Relative: 1 %
Eosinophils Absolute: 0.6 10*3/uL — ABNORMAL HIGH (ref 0.0–0.5)
Eosinophils Relative: 7 %
HCT: 31.4 % — ABNORMAL LOW (ref 36.0–46.0)
Hemoglobin: 9.4 g/dL — ABNORMAL LOW (ref 12.0–15.0)
Immature Granulocytes: 1 %
Lymphocytes Relative: 29 %
Lymphs Abs: 2.4 10*3/uL (ref 0.7–4.0)
MCH: 22.9 pg — ABNORMAL LOW (ref 26.0–34.0)
MCHC: 29.9 g/dL — ABNORMAL LOW (ref 30.0–36.0)
MCV: 76.4 fL — ABNORMAL LOW (ref 80.0–100.0)
Monocytes Absolute: 0.5 10*3/uL (ref 0.1–1.0)
Monocytes Relative: 7 %
Neutro Abs: 4.6 10*3/uL (ref 1.7–7.7)
Neutrophils Relative %: 55 %
Platelet Count: 432 10*3/uL — ABNORMAL HIGH (ref 150–400)
RBC: 4.11 MIL/uL (ref 3.87–5.11)
RDW: 22.5 % — ABNORMAL HIGH (ref 11.5–15.5)
WBC Count: 8.2 10*3/uL (ref 4.0–10.5)
nRBC: 0 % (ref 0.0–0.2)

## 2023-04-07 LAB — COMPREHENSIVE METABOLIC PANEL
ALT: 7 U/L (ref 0–44)
AST: 14 U/L — ABNORMAL LOW (ref 15–41)
Albumin: 3.8 g/dL (ref 3.5–5.0)
Alkaline Phosphatase: 46 U/L (ref 38–126)
Anion gap: 8 (ref 5–15)
BUN: 6 mg/dL (ref 6–20)
CO2: 24 mmol/L (ref 22–32)
Calcium: 8.4 mg/dL — ABNORMAL LOW (ref 8.9–10.3)
Chloride: 108 mmol/L (ref 98–111)
Creatinine, Ser: 0.78 mg/dL (ref 0.44–1.00)
GFR, Estimated: >60 mL/min (ref >60)
Glucose, Bld: 95 mg/dL (ref 70–99)
Potassium: 3.5 mmol/L (ref 3.5–5.1)
Sodium: 140 mmol/L (ref 135–145)
Total Bilirubin: 0.3 mg/dL (ref 0.0–1.2)
Total Protein: 6.6 g/dL (ref 6.5–8.1)

## 2023-04-07 LAB — FERRITIN: Ferritin: 5 ng/mL — ABNORMAL LOW (ref 11–307)

## 2023-04-09 ENCOUNTER — Encounter: Payer: Self-pay | Admitting: Hematology

## 2023-04-11 ENCOUNTER — Telehealth: Payer: Self-pay

## 2023-04-11 NOTE — Telephone Encounter (Signed)
 Dr. Lanny, patient will be scheduled as soon as possible.  Auth Submission: NO AUTH NEEDED Site of care: Site of care: [facility] Payer: [employer]na Medication & CPT/J Code(s) submitted: Venofer  (Iron  Sucrose) J1756 Route of submission (phone, fax, portal):  Phone # Fax # Auth type: Buy/Bill PB Units/visits requested: 200mg  x 5 doses Reference number:  Approval from: 04/11/23 to 09/29/23

## 2023-04-14 ENCOUNTER — Other Ambulatory Visit (HOSPITAL_COMMUNITY): Payer: Self-pay

## 2023-04-14 MED ORDER — WEGOVY 2.4 MG/0.75ML ~~LOC~~ SOAJ
2.4000 mg | SUBCUTANEOUS | 0 refills | Status: DC
  Filled 2023-04-14: qty 3, 28d supply, fill #0

## 2023-04-24 ENCOUNTER — Ambulatory Visit: Payer: No Typology Code available for payment source

## 2023-04-24 VITALS — BP 105/70 | HR 88 | Temp 99.2°F | Resp 16 | Ht 64.5 in | Wt 219.6 lb

## 2023-04-24 DIAGNOSIS — D500 Iron deficiency anemia secondary to blood loss (chronic): Secondary | ICD-10-CM | POA: Diagnosis not present

## 2023-04-24 DIAGNOSIS — N920 Excessive and frequent menstruation with regular cycle: Secondary | ICD-10-CM

## 2023-04-24 MED ORDER — ACETAMINOPHEN 325 MG PO TABS: 650.0000 mg | ORAL_TABLET | Freq: Once | ORAL | Status: DC

## 2023-04-24 MED ORDER — IRON SUCROSE 20 MG/ML IV SOLN
200.0000 mg | Freq: Once | INTRAVENOUS | Status: AC
  Administered 2023-04-24: 200 mg via INTRAVENOUS
  Filled 2023-04-24: qty 10

## 2023-04-24 MED ORDER — DIPHENHYDRAMINE HCL 25 MG PO CAPS: 25.0000 mg | ORAL_CAPSULE | Freq: Once | ORAL | Status: DC

## 2023-04-24 NOTE — Progress Notes (Signed)
Diagnosis: Acute Anemia  Provider:  Chilton Greathouse MD  Procedure: IV Push  IV Type: Peripheral, IV Location: L Antecubital  Venofer (Iron Sucrose), Dose: 200 mg  Post Infusion IV Care: Patient declined observation and Peripheral IV Discontinued  Discharge: Condition: Good, Destination: Home . AVS Provided  Performed by:  Nat Math, RN

## 2023-04-30 ENCOUNTER — Other Ambulatory Visit: Payer: Self-pay

## 2023-04-30 ENCOUNTER — Telehealth: Payer: Self-pay | Admitting: *Deleted

## 2023-04-30 NOTE — Telephone Encounter (Signed)
Connected with [place], [phone] (home) upon receipt of intermittent FMLA form from receptionist to request [facility] Authorization and [facility] forms cover sheet.  "I will return to complete these.  Leaving work to Psychologist, counselling for treatments.  Receiving points effective 03/17/2023 and was advised to obtain FMLA to not lose my job."  Receptionist provided needed forms for [employer] to complete, sign and has returned them to this nurse.

## 2023-05-01 ENCOUNTER — Ambulatory Visit: Payer: No Typology Code available for payment source

## 2023-05-01 VITALS — BP 104/70 | HR 93 | Temp 98.7°F | Resp 18 | Ht 64.0 in | Wt 215.5 lb

## 2023-05-01 DIAGNOSIS — N920 Excessive and frequent menstruation with regular cycle: Secondary | ICD-10-CM

## 2023-05-01 DIAGNOSIS — D500 Iron deficiency anemia secondary to blood loss (chronic): Secondary | ICD-10-CM | POA: Diagnosis not present

## 2023-05-01 MED ORDER — IRON SUCROSE 20 MG/ML IV SOLN
200.0000 mg | Freq: Once | INTRAVENOUS | Status: AC
Start: 2023-05-01 — End: 2023-05-01
  Administered 2023-05-01: 200 mg via INTRAVENOUS
  Filled 2023-05-01: qty 10

## 2023-05-01 MED ORDER — DIPHENHYDRAMINE HCL 25 MG PO CAPS: 25.0000 mg | ORAL_CAPSULE | Freq: Once | ORAL | Status: DC

## 2023-05-01 MED ORDER — ACETAMINOPHEN 325 MG PO TABS: 650.0000 mg | ORAL_TABLET | Freq: Once | ORAL | Status: DC

## 2023-05-01 NOTE — Progress Notes (Signed)
Diagnosis: Iron Deficiency Anemia  Provider:  Chilton Greathouse MD  Procedure: IV Push  IV Type: Peripheral, IV Location: L Antecubital  Venofer (Iron Sucrose), Dose: 200 mg  Post Infusion IV Care: Patient declined observation and Peripheral IV Discontinued  Discharge: Condition: Good, Destination: Home . AVS Declined  Performed by:  Marlow Baars [place]-Burchett, RN

## 2023-05-05 NOTE — Telephone Encounter (Signed)
05/02/2023 Late Entry Completed form to collaborative pick up bin for provider.  Today received [employer] FMLA form, signed by provider.  Outbound fax returned to [employer] of [employer] designer, theatre/television/film sent via email due to [employer] transmission failure.  Copy to [facility] bin for items to be scanned.  Mailed form to [employer] address on file. [address] [address] [facility] [id number] Process completed with no further instructions received, actions performed or required by this nurse.Marland Kitchen

## 2023-05-06 ENCOUNTER — Encounter: Payer: Self-pay | Admitting: Hematology

## 2023-05-07 ENCOUNTER — Encounter: Payer: Self-pay | Admitting: Hematology

## 2023-05-07 NOTE — Telephone Encounter (Signed)
 Connected with [place] CHRISTELLA Gaunt, [phone] (home) regarding request to update FMLA leave paperwork to include December appointments for Venofer .  Advised unable to modify form.  All appointments were listed as noted in EPIC appointments desk.  The one appointment seen by this nurse scheduled December 3rd, 2024 status reads, No show.  Your EPIC EMR Clinical Support entries read Venofer  series was received 02/20/2023, 02/24/2023, 02/26/2023.  No show for appointment scheduled 03/04/2023.  Next Clinical Support care was received 04/24/2023 and 05/01/2023 thus far. December is when I started going to [employer].  [place] on a Mon./Wed./Fri. around March 17, 2023.  After receiving those three, I couldn't continue further IVs in my arm is why I didn't return.  I'll reach out to them because I was seen in December. This nurse reached out to [facility] [phone]).  Erin conformed this nurse findings described above.  We didn't hear back from her after she cancelled the December appointment.  Started new series in January. Victoriano Longs for clarifying to assist.  Conveyed to expect call from patient.  No further actions performed by this nurse.

## 2023-05-08 ENCOUNTER — Ambulatory Visit: Payer: No Typology Code available for payment source

## 2023-05-08 VITALS — BP 116/79 | HR 104 | Temp 97.9°F | Resp 20 | Ht 64.0 in | Wt 219.6 lb

## 2023-05-08 DIAGNOSIS — D500 Iron deficiency anemia secondary to blood loss (chronic): Secondary | ICD-10-CM

## 2023-05-08 DIAGNOSIS — N920 Excessive and frequent menstruation with regular cycle: Secondary | ICD-10-CM

## 2023-05-08 MED ORDER — DIPHENHYDRAMINE HCL 25 MG PO CAPS: 25.0000 mg | ORAL_CAPSULE | Freq: Once | ORAL | Status: DC

## 2023-05-08 MED ORDER — ACETAMINOPHEN 325 MG PO TABS
650.0000 mg | ORAL_TABLET | Freq: Once | ORAL | Status: DC
Start: 2023-05-08 — End: 2023-05-08

## 2023-05-08 MED ORDER — IRON SUCROSE 20 MG/ML IV SOLN
200.0000 mg | Freq: Once | INTRAVENOUS | Status: AC
  Administered 2023-05-08: 200 mg via INTRAVENOUS
  Filled 2023-05-08: qty 10

## 2023-05-08 NOTE — Progress Notes (Signed)
 Diagnosis: Iron Deficiency Anemia  Provider:  Chilton Greathouse MD  Procedure: IV Push  IV Type: Peripheral, IV Location: L Antecubital  Venofer (Iron Sucrose), Dose: 200 mg  Post Infusion IV Care: Patient declined observation and Peripheral IV Discontinued  Discharge: Condition: Good, Destination: Home . AVS Declined  Performed by:  Adriana Mccallum, RN

## 2023-05-14 ENCOUNTER — Other Ambulatory Visit (HOSPITAL_COMMUNITY): Payer: Self-pay

## 2023-05-14 MED ORDER — WEGOVY 2.4 MG/0.75ML ~~LOC~~ SOAJ
2.4000 mg | SUBCUTANEOUS | 0 refills | Status: DC
Start: 2023-05-14 — End: 2023-06-06
  Filled 2023-05-14: qty 3, 28d supply, fill #0

## 2023-05-15 ENCOUNTER — Ambulatory Visit: Payer: No Typology Code available for payment source

## 2023-05-15 VITALS — BP 107/73 | HR 75 | Temp 98.1°F | Resp 16

## 2023-05-15 DIAGNOSIS — D500 Iron deficiency anemia secondary to blood loss (chronic): Secondary | ICD-10-CM | POA: Diagnosis not present

## 2023-05-15 DIAGNOSIS — N920 Excessive and frequent menstruation with regular cycle: Secondary | ICD-10-CM | POA: Diagnosis not present

## 2023-05-15 MED ORDER — DIPHENHYDRAMINE HCL 25 MG PO CAPS: 25.0000 mg | ORAL_CAPSULE | Freq: Once | ORAL | Status: DC

## 2023-05-15 MED ORDER — IRON SUCROSE 20 MG/ML IV SOLN
200.0000 mg | Freq: Once | INTRAVENOUS | Status: AC
  Administered 2023-05-15: 200 mg via INTRAVENOUS
  Filled 2023-05-15: qty 10

## 2023-05-15 MED ORDER — ACETAMINOPHEN 325 MG PO TABS
650.0000 mg | ORAL_TABLET | Freq: Once | ORAL | Status: DC
Start: 2023-05-15 — End: 2023-05-15

## 2023-05-15 NOTE — Progress Notes (Signed)
Diagnosis: Iron Deficiency Anemia  Provider:  Chilton Greathouse MD  Procedure: IV Push  IV Type: Peripheral, IV Location: L Antecubital  Venofer (Iron Sucrose), Dose: 200 mg  Patient refused pre-medications. Nurse educated patient and stressed the importance of taking pre-medications as a precaution in the event of a medication reaction. Patient verbalized understanding.   Post Infusion IV Care: Patient declined observation and Peripheral IV Discontinued  Discharge: Condition: Good, Destination: Home . AVS Declined  Performed by:  Loney Hering, LPN

## 2023-05-22 ENCOUNTER — Ambulatory Visit: Payer: No Typology Code available for payment source

## 2023-05-22 MED ORDER — DIPHENHYDRAMINE HCL 25 MG PO CAPS: 25.0000 mg | ORAL_CAPSULE | Freq: Once | ORAL | Status: DC

## 2023-05-22 MED ORDER — IRON SUCROSE 20 MG/ML IV SOLN: 200.0000 mg | Freq: Once | INTRAVENOUS | Status: DC

## 2023-05-22 MED ORDER — ACETAMINOPHEN 325 MG PO TABS: 650.0000 mg | ORAL_TABLET | Freq: Once | ORAL | Status: DC

## 2023-06-04 ENCOUNTER — Other Ambulatory Visit: Payer: Self-pay | Admitting: Hematology

## 2023-06-06 ENCOUNTER — Other Ambulatory Visit (HOSPITAL_COMMUNITY): Payer: Self-pay

## 2023-06-06 MED ORDER — WEGOVY 2.4 MG/0.75ML ~~LOC~~ SOAJ
2.4000 mg | SUBCUTANEOUS | 0 refills | Status: DC
Start: 2023-06-05 — End: 2023-07-04
  Filled 2023-06-06: qty 3, 28d supply, fill #0

## 2023-06-07 NOTE — Assessment & Plan Note (Signed)
 ductal carcinoma, pT2N9miM0, Stage IB, ER+/PR+/HER2-, Grade 2, Oncotype RS 19  -presented with a palpable right breast mass. Oncotype on biopsy RS of 19, low risk. -s/p right mastectomy by Dr. Dwain Sarna and tissue expander placement by Dr. Arita Miss on 12/25/20. Pathology showed: IDC with lobular features, grade 2, spanning 3.1 cm; intermediate grade DCIS; margins and lymph nodes negative except one node containing isolated tumor cells -she started tamoxifen on 10/18/20. Tolerating well with hot flashes.  -most recent left mammogram in 01/2023 was negative. Given concern for enhancement seen on MRI, biopsy was performed on 09/05/21, path was benign. -I previously discussed ovarian suppression, mainly in light of her very heavy periods. She can consider Zoladex injections or BSO. I explained these would put her into early menopause. I noted the benefit of the Zoladex is that it's reversible. I encouraged her to discuss with her PCP/GYN. She will think about it.

## 2023-06-09 ENCOUNTER — Telehealth: Payer: Self-pay | Admitting: Pharmacy Technician

## 2023-06-09 ENCOUNTER — Inpatient Hospital Stay (HOSPITAL_BASED_OUTPATIENT_CLINIC_OR_DEPARTMENT_OTHER): Payer: No Typology Code available for payment source | Admitting: Hematology

## 2023-06-09 ENCOUNTER — Encounter: Payer: Self-pay | Admitting: Hematology

## 2023-06-09 ENCOUNTER — Inpatient Hospital Stay: Payer: No Typology Code available for payment source | Attending: Hematology

## 2023-06-09 VITALS — BP 138/88 | HR 95 | Temp 97.8°F | Resp 17 | Wt 212.4 lb

## 2023-06-09 DIAGNOSIS — Z170 Estrogen receptor positive status [ER+]: Secondary | ICD-10-CM | POA: Insufficient documentation

## 2023-06-09 DIAGNOSIS — Z1721 Progesterone receptor positive status: Secondary | ICD-10-CM | POA: Insufficient documentation

## 2023-06-09 DIAGNOSIS — D649 Anemia, unspecified: Secondary | ICD-10-CM | POA: Diagnosis not present

## 2023-06-09 DIAGNOSIS — C50211 Malignant neoplasm of upper-inner quadrant of right female breast: Secondary | ICD-10-CM | POA: Insufficient documentation

## 2023-06-09 DIAGNOSIS — Z1732 Human epidermal growth factor receptor 2 negative status: Secondary | ICD-10-CM | POA: Diagnosis not present

## 2023-06-09 DIAGNOSIS — Z9011 Acquired absence of right breast and nipple: Secondary | ICD-10-CM | POA: Diagnosis not present

## 2023-06-09 DIAGNOSIS — Z8042 Family history of malignant neoplasm of prostate: Secondary | ICD-10-CM | POA: Insufficient documentation

## 2023-06-09 DIAGNOSIS — E119 Type 2 diabetes mellitus without complications: Secondary | ICD-10-CM | POA: Insufficient documentation

## 2023-06-09 DIAGNOSIS — Z79810 Long term (current) use of selective estrogen receptor modulators (SERMs): Secondary | ICD-10-CM | POA: Insufficient documentation

## 2023-06-09 DIAGNOSIS — D500 Iron deficiency anemia secondary to blood loss (chronic): Secondary | ICD-10-CM

## 2023-06-09 DIAGNOSIS — N920 Excessive and frequent menstruation with regular cycle: Secondary | ICD-10-CM | POA: Diagnosis not present

## 2023-06-09 LAB — CBC WITH DIFFERENTIAL (CANCER CENTER ONLY)
Abs Immature Granulocytes: 0.02 10*3/uL (ref 0.00–0.07)
Basophils Absolute: 0 10*3/uL (ref 0.0–0.1)
Basophils Relative: 0 %
Eosinophils Absolute: 0.4 10*3/uL (ref 0.0–0.5)
Eosinophils Relative: 5 %
HCT: 30.9 % — ABNORMAL LOW (ref 36.0–46.0)
Hemoglobin: 9.3 g/dL — ABNORMAL LOW (ref 12.0–15.0)
Immature Granulocytes: 0 %
Lymphocytes Relative: 26 %
Lymphs Abs: 2 10*3/uL (ref 0.7–4.0)
MCH: 23.7 pg — ABNORMAL LOW (ref 26.0–34.0)
MCHC: 30.1 g/dL (ref 30.0–36.0)
MCV: 78.8 fL — ABNORMAL LOW (ref 80.0–100.0)
Monocytes Absolute: 0.6 10*3/uL (ref 0.1–1.0)
Monocytes Relative: 8 %
Neutro Abs: 4.8 10*3/uL (ref 1.7–7.7)
Neutrophils Relative %: 61 %
Platelet Count: 392 10*3/uL (ref 150–400)
RBC: 3.92 MIL/uL (ref 3.87–5.11)
RDW: 19.7 % — ABNORMAL HIGH (ref 11.5–15.5)
WBC Count: 7.9 10*3/uL (ref 4.0–10.5)
nRBC: 0 % (ref 0.0–0.2)

## 2023-06-09 LAB — COMPREHENSIVE METABOLIC PANEL
ALT: 6 U/L (ref 0–44)
AST: 12 U/L — ABNORMAL LOW (ref 15–41)
Albumin: 3.9 g/dL (ref 3.5–5.0)
Alkaline Phosphatase: 42 U/L (ref 38–126)
Anion gap: 5 (ref 5–15)
BUN: 10 mg/dL (ref 6–20)
CO2: 25 mmol/L (ref 22–32)
Calcium: 8.5 mg/dL — ABNORMAL LOW (ref 8.9–10.3)
Chloride: 109 mmol/L (ref 98–111)
Creatinine, Ser: 0.75 mg/dL (ref 0.44–1.00)
GFR, Estimated: >60 mL/min (ref >60)
Glucose, Bld: 88 mg/dL (ref 70–99)
Potassium: 3.6 mmol/L (ref 3.5–5.1)
Sodium: 139 mmol/L (ref 135–145)
Total Bilirubin: 0.3 mg/dL (ref 0.0–1.2)
Total Protein: 6.5 g/dL (ref 6.5–8.1)

## 2023-06-09 LAB — FERRITIN: Ferritin: 25 ng/mL (ref 11–307)

## 2023-06-09 NOTE — Progress Notes (Signed)
 [facility]   [phone] [phone]   Clinic Follow up Note   Patient Care Team: Soundra Pilon, FNP as PCP - General (Family Medicine) Pershing Proud, RN as Oncology Nurse Navigator Donnelly Angelica, RN as Oncology Nurse Navigator Emelia Loron, MD as Consulting Physician (General Surgery) Malachy Mood, MD as Consulting Physician (Hematology) Antony Blackbird, MD as Consulting Physician (Radiation Oncology)  Date of Service:  06/09/2023  CHIEF COMPLAINT: f/u of breast cancer and anemia  CURRENT THERAPY:  IV iron as needed -Adjuvant tamoxifen  Oncology History   Malignant neoplasm of upper-inner quadrant of right breast in female, estrogen receptor positive (HCC)  ductal carcinoma, pT2N56miM0, Stage IB, ER+/PR+/HER2-, Grade 2, Oncotype RS 19  -presented with a palpable right breast mass. Oncotype on biopsy RS of 19, low risk. -s/p right mastectomy by Dr. Dwain Sarna and tissue expander placement by Dr. Arita Miss on 12/25/20. Pathology showed: IDC with lobular features, grade 2, spanning 3.1 cm; intermediate grade DCIS; margins and lymph nodes negative except one node containing isolated tumor cells -she started tamoxifen on 10/18/20. Tolerating well with hot flashes.  -most recent left mammogram in 01/2023 was negative. Given concern for enhancement seen on MRI, biopsy was performed on 09/05/21, path was benign. -I previously discussed ovarian suppression, mainly in light of her very heavy periods. She can consider Zoladex injections or BSO. I explained these would put her into early menopause. I noted the benefit of the Zoladex is that it's reversible. I encouraged her to discuss with her PCP/GYN. She will think about it.    Assessment and Plan    Breast Cancer Post-mastectomy and implant on the right side. No pain from surgery or radiation. Last mammogram (November) was negative but showed dense breast tissue (density C). Discussed contrast-enhanced  mammogram for improved screening, noting potential increased cost. - Order contrast-enhanced mammogram at next visit for 01/2024 - Continue tamoxifen  Anemia Chronic anemia with hemoglobin at 9.3 g/dL, slightly improved. Heavy menstrual periods contributing. Currently on IV iron infusions. Insurance did not cover prescribed medication. Discussed hysterectomy with gynecologist as a potential resolution. Prefers longer IV iron infusions to reduce frequency and bruising. - Schedule weekly IV iron infusions for five weeks - Monthly blood counts - Discuss hysterectomy with gynecologist  Diabetes Mellitus Diabetes with elevated A1c. On medication for blood sugar control and weight management. - Continue current diabetes medication  Follow-up - Follow-up in four months for anemia status - Monthly blood count checks.     Plan -Lab reviewed, hemoglobin 9.3, ferritin 25.  She received Venofer 200 mg last week -Will continue Venofer 200 mg weekly for additional 4 weeks, or 400 mg weekly for 2 treatments -Lab monthly -Will up in 4 months    SUMMARY OF ONCOLOGIC HISTORY: Oncology History Overview Note  Cancer Staging Malignant neoplasm of upper-inner quadrant of right breast in female, estrogen receptor positive (HCC) Staging form: Breast, AJCC 8th Edition - Clinical stage from 10/06/2020: Stage IB (cT2, cN0, cM0, G2, ER+, PR+, HER2-) - Signed by Malachy Mood, MD on 10/17/2020 Stage prefix: Initial diagnosis Histologic grading system: 3 grade system - Pathologic stage from 12/25/2020: No Stage Recommended (ypT2, pN66mi, cM0, G2, ER+, PR+, HER2-) - Signed by Malachy Mood, MD on 01/22/2021 Stage prefix: Post-therapy Histologic grading system: 3 grade system Residual tumor (R): R0 - None    Malignant neoplasm of upper-inner quadrant of right breast in female, estrogen receptor positive (HCC)  10/06/2020 Mammogram   DIGITAL DIAGNOSTIC BILATERAL MAMMOGRAM  WITH TOMOSYNTHESIS AND CAD; ULTRASOUND RIGHT  BREAST LIMITED  IMPRESSION: Two contiguous hypoechoic masses in the right breast- an irregular hypoechoic mass in the right breast at 12:30 3 cm from the nipple measuring 2.5 x 2.1 x 3.0 cm, and a hypoechoic heterogeneous mass at 12:30 2 cm from the nipple measuring 3.0 x 1.6 x 2.8 cm. Sonographic evaluation of the right axilla does not show any enlarged adenopathy.   10/06/2020 Pathology Results   Diagnosis Breast, right, needle core biopsy - INVASIVE MAMMARY CARCINOMA WITH LOBULAR FEATURES - SEE COMMENT Microscopic Comment The biopsy material shows an infiltrative proliferation of cells with large vesicular nuclei with inconspicuous nucleoli, arranged linearly and in small clusters. Based on the biopsy, the carcinoma appears Nottingham grade 2 of 3 and measures 0.9 cm in greatest linear extent  E-cadherin is POSITIVE supporting a ductal origin.  PROGNOSTIC INDICATORS Results: IMMUNOHISTOCHEMICAL AND MORPHOMETRIC ANALYSIS PERFORMED MANUALLY The tumor cells are NEGATIVE for Her2 (1+). Estrogen Receptor: 80%, POSITIVE, STRONG STAINING INTENSITY Progesterone Receptor: 90%, POSITIVE, STRONG STAINING INTENSITY Proliferation Marker Ki67: 10%   10/06/2020 Cancer Staging   Staging form: Breast, AJCC 8th Edition - Clinical stage from 10/06/2020: Stage IB (cT2, cN0, cM0, G2, ER+, PR+, HER2-) - Signed by Malachy Mood, MD on 10/17/2020 Stage prefix: Initial diagnosis Histologic grading system: 3 grade system   10/06/2020 Oncotype testing   RS of 19, distant recurrence risk of 6%, chemotherapy not recommended   10/13/2020 Initial Diagnosis   Malignant neoplasm of upper-inner quadrant of right breast in female, estrogen receptor positive (HCC)   10/24/2020 Genetic Testing   Negative hereditary cancer genetic testing: no pathogenic variants detected in [facility] BRCAPlus Panel and [facility] CancerNext-Expanded +RNAinsight.  Variants of uncertain significance detected in FH at p.Y2H (c.4T>C) and SUFU at  p.A138T  (c.412G>A). The report dates are October 24, 2020 and October 27, 2020, respectively.    FH p.Y2H (c.4T>C) variant of uncertain significance was reclassified to "likely benign". Report date is 03/15/2021. SUFU p.A138T (c.412G>A) variant of uncertain significance was reclassified to "benign". Report date is 10/05/2021.  The BRCAplus panel offered by [employer] and includes sequencing and deletion/duplication analysis for the following 8 genes: ATM, BRCA1, BRCA2, CDH1, CHEK2, PALB2, PTEN, and TP53.  The CancerNext-Expanded gene panel offered by [facility] and includes sequencing, rearrangement, and RNA analysis for the following 77 genes: AIP, ALK, APC, ATM, AXIN2, BAP1, BARD1, BLM, BMPR1A, BRCA1, BRCA2, BRIP1, CDC73, CDH1, CDK4, CDKN1B, CDKN2A, CHEK2, CTNNA1, DICER1, FANCC, FH, FLCN, GALNT12, KIF1B, LZTR1, MAX, MEN1, MET, MLH1, MSH2, MSH3, MSH6, MUTYH, NBN, NF1, NF2, NTHL1, PALB2, PHOX2B, PMS2, POT1, PRKAR1A, PTCH1, PTEN, RAD51C, RAD51D, RB1, RECQL, RET, SDHA, SDHAF2, SDHB, SDHC, SDHD, SMAD4, SMARCA4, SMARCB1, SMARCE1, STK11, SUFU, TMEM127, TP53, TSC1, TSC2, VHL and XRCC2 (sequencing and deletion/duplication); EGFR, EGLN1, HOXB13, KIT, MITF, PDGFRA, POLD1, and POLE (sequencing only); EPCAM and GREM1 (deletion/duplication only).     11/09/2020 Imaging   Breast MRI  IMPRESSION: 1. Known biopsy-proven invasive carcinoma within the upper inner quadrant of the RIGHT breast, measuring 3.6 cm, containing a biopsy clip. Contiguous linear non-mass enhancement extends superior-lateral to the spiculated mass, increasing overall craniocaudal dimension to 3 cm. 2. Additional irregular enhancing mass within the inner RIGHT breast, 3 o'clock axis region, at anterior depth, measuring 7 mm, with suspicious washout kinetics. MRI-guided biopsy is recommended to exclude multifocal disease. 3. Additional linear clumped non-mass enhancement within the upper inner quadrant of the LEFT breast, at posterior depth, measuring  1.8 cm extent, with suspicious washout kinetics.  This may represent an intramammary lymph node. MRI-guided biopsy is recommended to exclude contralateral disease.   12/25/2020 Cancer Staging   Staging form: Breast, AJCC 8th Edition - Pathologic stage from 12/25/2020: No Stage Recommended (ypT2, pN42mi, cM0, G2, ER+, PR+, HER2-) - Signed by Malachy Mood, MD on 01/22/2021 Stage prefix: Post-therapy Histologic grading system: 3 grade system Residual tumor (R): R0 - None   12/25/2020 Definitive Surgery   FINAL MICROSCOPIC DIAGNOSIS:   A. BREAST, RIGHT, MASTECTOMY:  - Invasive ductal carcinoma with lobular features, grade 2, spanning 3.1 cm.  - Intermediate grade ductal carcinoma in situ.  - Margins are negative for carcinoma.  - Lymphovascular invasion.  - Biopsy site.  - Fibroadenomatoid change.  - Pseudoangiomatous stromal hyperplasia.  - Intraductal papilloma.  - See oncology table.   B. LYMPH NODE, RIGHT AXILLARY, SENTINEL EXCISION:  - Isolated tumor cells in one of one lymph node (0/1).   C. LYMPH NODE, RIGHT AXILLARY, SENTINEL EXCISION:  - One of one lymph nodes negative for carcinoma. (0/1).   04/19/2021 Survivorship   SCP delivered by Santiago Glad, NP      Discussed the use of AI scribe software for clinical note transcription with the patient, who gave verbal consent to proceed.  History of Present Illness   A 48 year old patient with a history of breast cancer and anemia presents for a follow-up visit. She reports an improvement in her energy levels, indicating a possible improvement in her anemia. However, she continues to experience heavy menstrual periods, which are likely contributing to her anemia. Her hemoglobin level today is 9.3, which is slightly improved from last year but still indicative of anemia.  In addition to her anemia, the patient is being treated for breast cancer with tamoxifen. Her last mammogram in November was negative, but her breast tissue is dense,  which can make cancer detection more difficult.         All other systems were reviewed with the patient and are negative.  MEDICAL HISTORY:  Past Medical History:  Diagnosis Date   Anemia    Anxiety    Breast cancer (HCC)    right   Depression    Family history of prostate cancer 10/18/2020   Headache    migraines   Hx of migraines    Pre-diabetes    Seasonal allergic reaction     SURGICAL HISTORY: Past Surgical History:  Procedure Laterality Date   BREAST RECONSTRUCTION WITH PLACEMENT OF TISSUE EXPANDER AND FLEX HD (ACELLULAR HYDRATED DERMIS) Right 12/25/2020   Procedure: RIGHT BREAST RECONSTRUCTION WITH PLACEMENT OF TISSUE EXPANDER AND FLEX HD (ACELLULAR HYDRATED DERMIS);  Surgeon: Allena Napoleon, MD;  Location: [facility] OR;  Service: Plastics;  Laterality: Right;   IR ANGIO EXTERNAL CAROTID SEL EXT CAROTID BILAT MOD SED  07/03/2022   IR ANGIO INTRA EXTRACRAN SEL INTERNAL CAROTID BILAT MOD SED  07/03/2022   IR ANGIO VERTEBRAL SEL VERTEBRAL UNI L MOD SED  07/03/2022   LIPOSUCTION WITH LIPOFILLING Right 12/11/2021   Procedure: Fat grafting to right breast from abdomen;  Surgeon: Janne Napoleon, MD;  Location: [facility];  Service: Plastics;  Laterality: Right;   MASTECTOMY Right 2022   MASTECTOMY W/ SENTINEL NODE BIOPSY Right 12/25/2020   Procedure: RIGHT MASTECTOMY WITH RIGHT AXILLARY SENTINEL LYMPH NODE BIOPSY;  Surgeon: Emelia Loron, MD;  Location: [facility] OR;  Service: General;  Laterality: Right;   MASTOPEXY Left 03/12/2021   Procedure: LEFT MASTOPEXY;  Surgeon: Allena Napoleon, MD;  Location: [facility]  OR;  Service: Government social research officer;  Laterality: Left;   RADIOLOGY WITH ANESTHESIA N/A 07/03/2022   Procedure: IR WITH ANESTHESIA;  Surgeon: Lisbeth Renshaw, MD;  Location: [facility] OR;  Service: Radiology;  Laterality: N/A;   REDUCTION MAMMAPLASTY Left 2022   REMOVAL OF BILATERAL TISSUE EXPANDERS WITH PLACEMENT OF BILATERAL BREAST IMPLANTS Right 03/12/2021   Procedure: REMOVAL  OF RIGHT TISSUE EXPANDERS WITH PLACEMENT OF RIGHT  BREAST IMPLANTS;  Surgeon: Allena Napoleon, MD;  Location: [facility] OR;  Service: Plastics;  Laterality: Right;   TUBAL LIGATION      I have reviewed the social history and family history with the patient and they are unchanged from previous note.  ALLERGIES:  is allergic to tomato and pollen extract.  MEDICATIONS:  Current Outpatient Medications  Medication Sig Dispense Refill   ACCRUFER 30 MG CAPS TAKE 1 CAPSULE (30 MG TOTAL) BY MOUTH DAILY AT 2 PM. 30 capsule 5   EMGALITY 120 MG/ML SOAJ Inject 1 mL into the skin every 30 (thirty) days.     fluconazole (DIFLUCAN) 150 MG tablet SMARTSIG:1 Tablet(s) By Mouth     Semaglutide-Weight Management (WEGOVY) 0.25 MG/0.5ML SOAJ Inject 0.25 mg into the skin once a week. 2 mL 0   Semaglutide-Weight Management (WEGOVY) 0.5 MG/0.5ML SOAJ Inject 0.5 mg into the skin once a week. 2 mL 0   Semaglutide-Weight Management (WEGOVY) 1 MG/0.5ML SOAJ Inject 1 mg into the skin once a week. 2 mL 0   Semaglutide-Weight Management (WEGOVY) 1.7 MG/0.75ML SOAJ Inject 1.7 mg into the skin once a week. 3 mL 0   Semaglutide-Weight Management (WEGOVY) 2.4 MG/0.75ML SOAJ Inject 2.4 mg into the skin once a week. 3 mL 0   SUMAtriptan (IMITREX) 50 MG tablet Take 50 mg by mouth every 2 (two) hours as needed for migraine.     tamoxifen (NOLVADEX) 20 MG tablet TAKE 1 TABLET BY MOUTH EVERY DAY 90 tablet 2   topiramate (TOPAMAX) 25 MG tablet Take 25 mg by mouth 2 (two) times daily.     Vitamin D, Ergocalciferol, (DRISDOL) 1.25 MG (50000 UNIT) CAPS capsule Take 50,000 Units by mouth once a week.     No current facility-administered medications for this visit.    PHYSICAL EXAMINATION: ECOG PERFORMANCE STATUS: 1 - Symptomatic but completely ambulatory  Vitals:   06/09/23 0805  BP: 138/88  Pulse: 95  Resp: 17  Temp: 97.8 F (36.6 C)  SpO2: 99%   Wt Readings from Last 3 Encounters:  06/09/23 212 lb 6.4 oz (96.3 kg)  05/08/23  219 lb 9.6 oz (99.6 kg)  05/01/23 215 lb 8 oz (97.8 kg)     GENERAL:alert, no distress and comfortable SKIN: skin color, texture, turgor are normal, no rashes or significant lesions EYES: normal, Conjunctiva are pink and non-injected, sclera clear NECK: supple, thyroid normal size, non-tender, without nodularity LYMPH:  no palpable lymphadenopathy in the cervical, axillary  LUNGS: clear to auscultation and percussion with normal breathing effort HEART: regular rate & rhythm and no murmurs and no lower extremity edema ABDOMEN:abdomen soft, non-tender and normal bowel sounds Musculoskeletal:no cyanosis of digits and no clubbing  NEURO: alert & oriented x 3 with fluent speech, no focal motor/sensory deficits BREAST: Right breast mastectomy with implant, appears well.  No palpable mass in the right and left breast.     LABORATORY DATA:  I have reviewed the data as listed    Latest Ref Rng & Units 06/09/2023    7:55 AM 04/07/2023    7:47 AM 02/10/2023  1:40 PM  CBC  WBC 4.0 - 10.5 K/uL 7.9  8.2  9.6   Hemoglobin 12.0 - 15.0 g/dL 9.3  9.4  8.7   Hematocrit 36.0 - 46.0 % 30.9  31.4  29.3   Platelets 150 - 400 K/uL 392  432  513         Latest Ref Rng & Units 06/09/2023    7:55 AM 04/07/2023    7:47 AM 07/02/2022    9:58 PM  CMP  Glucose 70 - 99 mg/dL 88  95  696   BUN 6 - 20 mg/dL 10  6  10    Creatinine 0.44 - 1.00 mg/dL 2.95  2.84  1.32   Sodium 135 - 145 mmol/L 139  140  135   Potassium 3.5 - 5.1 mmol/L 3.6  3.5  3.2   Chloride 98 - 111 mmol/L 109  108  105   CO2 22 - 32 mmol/L 25  24  23    Calcium 8.9 - 10.3 mg/dL 8.5  8.4  8.0   Total Protein 6.5 - 8.1 g/dL 6.5  6.6    Total Bilirubin 0.0 - 1.2 mg/dL 0.3  0.3    Alkaline Phos 38 - 126 U/L 42  46    AST 15 - 41 U/L 12  14    ALT 0 - 44 U/L 6  7        RADIOGRAPHIC STUDIES: I have personally reviewed the radiological images as listed and agreed with the findings in the report. No results found.    No orders of the  defined types were placed in this encounter.  All questions were answered. The patient knows to call the clinic with any problems, questions or concerns. No barriers to learning was detected. The total time spent in the appointment was 25 minutes.     Malachy Mood, MD 06/09/2023

## 2023-06-09 NOTE — Telephone Encounter (Signed)
 Auth Submission: NO AUTH NEEDED Site of care: Site of care: [facility] Payer:  [employer] Medication & CPT/J Code(s) submitted: Venofer (Iron Sucrose) J1756 Route of submission (phone, fax, portal):  Phone # Fax # Auth type: Buy/Bill PB Units/visits requested: 3 Reference number:  Approval from: 06/09/23 to 11/09/23

## 2023-06-12 ENCOUNTER — Ambulatory Visit

## 2023-06-12 ENCOUNTER — Telehealth: Payer: Self-pay

## 2023-06-12 VITALS — BP 119/77 | HR 82 | Temp 99.5°F | Resp 16 | Ht 64.0 in | Wt 208.0 lb

## 2023-06-12 DIAGNOSIS — N920 Excessive and frequent menstruation with regular cycle: Secondary | ICD-10-CM

## 2023-06-12 DIAGNOSIS — D500 Iron deficiency anemia secondary to blood loss (chronic): Secondary | ICD-10-CM

## 2023-06-12 MED ORDER — IRON SUCROSE 20 MG/ML IV SOLN
200.0000 mg | Freq: Once | INTRAVENOUS | Status: AC
  Administered 2023-06-12: 200 mg via INTRAVENOUS
  Filled 2023-06-12: qty 10

## 2023-06-12 MED ORDER — SODIUM CHLORIDE 0.9 % IV BOLUS
250.0000 mL | Freq: Once | INTRAVENOUS | Status: DC
Start: 2023-06-12 — End: 2023-06-12
  Filled 2023-06-12: qty 250

## 2023-06-12 MED ORDER — DIPHENHYDRAMINE HCL 25 MG PO CAPS: 25.0000 mg | ORAL_CAPSULE | Freq: Once | ORAL | Status: DC

## 2023-06-12 MED ORDER — ACETAMINOPHEN 325 MG PO TABS: 650.0000 mg | ORAL_TABLET | Freq: Once | ORAL | Status: DC

## 2023-06-12 NOTE — Progress Notes (Signed)
 Diagnosis: Acute Anemia  Provider:  Chilton Greathouse MD  Procedure: IV Push  IV Type: Peripheral, IV Location: R Antecubital  Venofer (Iron Sucrose), Dose: 200 mg  Post Infusion IV Care: Patient declined observation and Peripheral IV Discontinued  Discharge: Condition: Good, Destination: Home . AVS Declined  Performed by:  Nat Math, RN

## 2023-06-12 NOTE — Telephone Encounter (Signed)
 Hello,  Auth Submission: NO AUTH NEEDED Site of care: Site of care: [facility] Payer: [employer] Medication & CPT/J Code(s) submitted: Venofer (Iron Sucrose) J1756 Route of submission (phone, fax, portal): portal Phone # Fax # Auth type: Buy/Bill PB Units/visits requested: 200mg  Reference number:  Approval from: 06/12/23 to 9/31/25   Per Dr. Mosetta Putt, pt to receive 3 more additional IV Irons d/t anemia

## 2023-07-04 ENCOUNTER — Other Ambulatory Visit (HOSPITAL_COMMUNITY): Payer: Self-pay

## 2023-07-04 MED ORDER — WEGOVY 2.4 MG/0.75ML ~~LOC~~ SOAJ
2.4000 mg | SUBCUTANEOUS | 0 refills | Status: DC
  Filled 2023-07-04: qty 3, 28d supply, fill #0

## 2023-08-01 ENCOUNTER — Other Ambulatory Visit (HOSPITAL_COMMUNITY): Payer: Self-pay

## 2023-08-01 MED ORDER — WEGOVY 2.4 MG/0.75ML ~~LOC~~ SOAJ
2.4000 mg | SUBCUTANEOUS | 0 refills | Status: DC
  Filled 2023-08-01 – 2023-08-18 (×2): qty 3, 28d supply, fill #0

## 2023-08-07 ENCOUNTER — Other Ambulatory Visit (HOSPITAL_COMMUNITY): Payer: Self-pay

## 2023-08-18 ENCOUNTER — Other Ambulatory Visit (HOSPITAL_COMMUNITY): Payer: Self-pay

## 2023-09-08 ENCOUNTER — Other Ambulatory Visit: Payer: Self-pay | Admitting: Family Medicine

## 2023-09-08 DIAGNOSIS — N63 Unspecified lump in unspecified breast: Secondary | ICD-10-CM

## 2023-09-09 ENCOUNTER — Other Ambulatory Visit: Payer: Self-pay | Admitting: Family Medicine

## 2023-09-09 DIAGNOSIS — N63 Unspecified lump in unspecified breast: Secondary | ICD-10-CM

## 2023-09-10 ENCOUNTER — Other Ambulatory Visit (HOSPITAL_COMMUNITY): Payer: Self-pay

## 2023-09-12 ENCOUNTER — Ambulatory Visit
Admission: RE | Admit: 2023-09-12 | Discharge: 2023-09-12 | Disposition: A | Discharge status: Home / Self Care | Source: Ambulatory Visit | Attending: Family Medicine | Admitting: Family Medicine

## 2023-09-12 DIAGNOSIS — N63 Unspecified lump in unspecified breast: Secondary | ICD-10-CM

## 2023-09-17 ENCOUNTER — Other Ambulatory Visit (HOSPITAL_COMMUNITY): Payer: Self-pay

## 2023-09-17 MED ORDER — WEGOVY 2.4 MG/0.75ML ~~LOC~~ SOAJ
2.4000 mg | SUBCUTANEOUS | 0 refills | Status: DC
  Filled 2023-09-17 – 2023-10-12 (×2): qty 3, 28d supply, fill #0

## 2023-09-29 ENCOUNTER — Other Ambulatory Visit (HOSPITAL_COMMUNITY): Payer: Self-pay

## 2023-10-06 ENCOUNTER — Other Ambulatory Visit: Payer: Self-pay | Admitting: Obstetrics and Gynecology

## 2023-10-06 ENCOUNTER — Other Ambulatory Visit (HOSPITAL_COMMUNITY)
Admission: RE | Admit: 2023-10-06 | Discharge: 2023-10-06 | Disposition: A | Discharge status: Home / Self Care | Source: Ambulatory Visit | Attending: Obstetrics and Gynecology | Admitting: Obstetrics and Gynecology

## 2023-10-06 DIAGNOSIS — Z124 Encounter for screening for malignant neoplasm of cervix: Secondary | ICD-10-CM | POA: Insufficient documentation

## 2023-10-10 LAB — CYTOLOGY - PAP
Adequacy: ABSENT
Comment: NEGATIVE
Diagnosis: NEGATIVE
High risk HPV: NEGATIVE

## 2023-10-12 ENCOUNTER — Other Ambulatory Visit (HOSPITAL_COMMUNITY): Payer: Self-pay

## 2023-10-13 ENCOUNTER — Other Ambulatory Visit (HOSPITAL_COMMUNITY): Payer: Self-pay

## 2023-10-13 MED ORDER — WEGOVY 2.4 MG/0.75ML ~~LOC~~ SOAJ
2.4000 mg | SUBCUTANEOUS | 0 refills | Status: DC
  Filled 2023-10-13: qty 3, 28d supply, fill #0

## 2023-11-03 ENCOUNTER — Other Ambulatory Visit (HOSPITAL_COMMUNITY): Payer: Self-pay

## 2023-11-03 MED ORDER — WEGOVY 2.4 MG/0.75ML ~~LOC~~ SOAJ
2.4000 mg | SUBCUTANEOUS | 0 refills | Status: AC
  Filled 2023-11-03 – 2023-11-14 (×2): qty 3, 28d supply, fill #0

## 2023-11-04 ENCOUNTER — Other Ambulatory Visit: Payer: Self-pay

## 2023-11-04 ENCOUNTER — Encounter: Payer: Self-pay | Admitting: Hematology

## 2023-11-04 ENCOUNTER — Other Ambulatory Visit: Payer: Self-pay | Admitting: Nurse Practitioner

## 2023-11-04 ENCOUNTER — Inpatient Hospital Stay: Attending: Hematology | Admitting: Hematology

## 2023-11-04 ENCOUNTER — Inpatient Hospital Stay: Admitting: Nurse Practitioner

## 2023-11-04 VITALS — BP 120/58 | HR 103 | Temp 98.7°F | Resp 15 | Ht 64.0 in | Wt 191.4 lb

## 2023-11-04 DIAGNOSIS — Z79810 Long term (current) use of selective estrogen receptor modulators (SERMs): Secondary | ICD-10-CM | POA: Diagnosis not present

## 2023-11-04 DIAGNOSIS — Z170 Estrogen receptor positive status [ER+]: Secondary | ICD-10-CM

## 2023-11-04 DIAGNOSIS — C50211 Malignant neoplasm of upper-inner quadrant of right female breast: Secondary | ICD-10-CM | POA: Insufficient documentation

## 2023-11-04 DIAGNOSIS — D500 Iron deficiency anemia secondary to blood loss (chronic): Secondary | ICD-10-CM

## 2023-11-04 DIAGNOSIS — Z1732 Human epidermal growth factor receptor 2 negative status: Secondary | ICD-10-CM | POA: Diagnosis not present

## 2023-11-04 DIAGNOSIS — D649 Anemia, unspecified: Secondary | ICD-10-CM

## 2023-11-04 DIAGNOSIS — N920 Excessive and frequent menstruation with regular cycle: Secondary | ICD-10-CM | POA: Diagnosis not present

## 2023-11-04 DIAGNOSIS — Z1721 Progesterone receptor positive status: Secondary | ICD-10-CM | POA: Insufficient documentation

## 2023-11-04 LAB — COMPREHENSIVE METABOLIC PANEL WITH GFR
ALT: 5 U/L (ref 0–44)
AST: 11 U/L — ABNORMAL LOW (ref 15–41)
Albumin: 3.8 g/dL (ref 3.5–5.0)
Alkaline Phosphatase: 38 U/L (ref 38–126)
Anion gap: 4 — ABNORMAL LOW (ref 5–15)
BUN: 9 mg/dL (ref 6–20)
CO2: 25 mmol/L (ref 22–32)
Calcium: 8.3 mg/dL — ABNORMAL LOW (ref 8.9–10.3)
Chloride: 112 mmol/L — ABNORMAL HIGH (ref 98–111)
Creatinine, Ser: 0.78 mg/dL (ref 0.44–1.00)
GFR, Estimated: >60 mL/min (ref >60)
Glucose, Bld: 95 mg/dL (ref 70–99)
Potassium: 3.3 mmol/L — ABNORMAL LOW (ref 3.5–5.1)
Sodium: 141 mmol/L (ref 135–145)
Total Bilirubin: 0.4 mg/dL (ref 0.0–1.2)
Total Protein: 6.5 g/dL (ref 6.5–8.1)

## 2023-11-04 LAB — CBC WITH DIFFERENTIAL (CANCER CENTER ONLY)
Abs Immature Granulocytes: 0.01 K/uL (ref 0.00–0.07)
Basophils Absolute: 0 K/uL (ref 0.0–0.1)
Basophils Relative: 1 %
Eosinophils Absolute: 0.4 K/uL (ref 0.0–0.5)
Eosinophils Relative: 4 %
HCT: 20.4 % — ABNORMAL LOW (ref 36.0–46.0)
Hemoglobin: 5.7 g/dL — CL (ref 12.0–15.0)
Immature Granulocytes: 0 %
Lymphocytes Relative: 23 %
Lymphs Abs: 1.8 K/uL (ref 0.7–4.0)
MCH: 18.3 pg — ABNORMAL LOW (ref 26.0–34.0)
MCHC: 27.9 g/dL — ABNORMAL LOW (ref 30.0–36.0)
MCV: 65.4 fL — ABNORMAL LOW (ref 80.0–100.0)
Monocytes Absolute: 0.5 K/uL (ref 0.1–1.0)
Monocytes Relative: 7 %
Neutro Abs: 5.2 K/uL (ref 1.7–7.7)
Neutrophils Relative %: 65 %
Platelet Count: 417 K/uL — ABNORMAL HIGH (ref 150–400)
RBC: 3.12 MIL/uL — ABNORMAL LOW (ref 3.87–5.11)
RDW: 19.7 % — ABNORMAL HIGH (ref 11.5–15.5)
WBC Count: 7.9 K/uL (ref 4.0–10.5)
nRBC: 0 % (ref 0.0–0.2)

## 2023-11-04 LAB — IRON AND IRON BINDING CAPACITY (CC-WL,HP ONLY)
Iron: 9 ug/dL — ABNORMAL LOW (ref 28–170)
Saturation Ratios: 2 % — ABNORMAL LOW (ref 10.4–31.8)
TIBC: 423 ug/dL (ref 250–450)
UIBC: 414 ug/dL (ref 148–442)

## 2023-11-04 LAB — FERRITIN: Ferritin: 3 ng/mL — ABNORMAL LOW (ref 11–307)

## 2023-11-04 LAB — PREPARE RBC (CROSSMATCH)

## 2023-11-04 LAB — SAMPLE TO BLOOD BANK

## 2023-11-04 NOTE — Assessment & Plan Note (Signed)
 ductal carcinoma, pT2N9miM0, Stage IB, ER+/PR+/HER2-, Grade 2, Oncotype RS 19  -presented with a palpable right breast mass. Oncotype on biopsy RS of 19, low risk. -s/p right mastectomy by Dr. Dwain Sarna and tissue expander placement by Dr. Arita Miss on 12/25/20. Pathology showed: IDC with lobular features, grade 2, spanning 3.1 cm; intermediate grade DCIS; margins and lymph nodes negative except one node containing isolated tumor cells -she started tamoxifen on 10/18/20. Tolerating well with hot flashes.  -most recent left mammogram in 01/2023 was negative. Given concern for enhancement seen on MRI, biopsy was performed on 09/05/21, path was benign. -I previously discussed ovarian suppression, mainly in light of her very heavy periods. She can consider Zoladex injections or BSO. I explained these would put her into early menopause. I noted the benefit of the Zoladex is that it's reversible. I encouraged her to discuss with her PCP/GYN. She will think about it.

## 2023-11-04 NOTE — Progress Notes (Signed)
 [facility]   [phone] [phone]   Clinic Follow up Note   Patient Care Team: [name], FNP as PCP - General (Family Medicine) Glean Stephane BROCKS, RN (Inactive) as Oncology Nurse Navigator [name], RN as Oncology Nurse Navigator [name], MD as Consulting Physician (General Surgery) [name], MD as Consulting Physician (Hematology) Shannon Agent, MD as Consulting Physician (Radiation Oncology)  Date of Service:  11/04/2023  CHIEF COMPLAINT: f/u of anemia  CURRENT THERAPY:  Blood transfusion and IV iron  as needed   Assessment & Plan Iron  deficiency anemia secondary to heavy menstrual bleeding Severe iron  deficiency anemia with hemoglobin at 5.7, likely exacerbated by heavy menstrual bleeding. Scheduled for a blood transfusion and IV iron  therapy to address acute anemia. Upcoming hysterectomy in September to address underlying cause of anemia. Discussed the need to resolve anemia before surgery to ensure faster recovery and prevent emergency department visits. - Administer blood transfusion on November 05, 2023. - Administer low dose IV iron  on November 05, 2023. - Schedule IV iron  infusions twice a week for the next two weeks, totaling five treatments. - Order blood count check in four weeks to assess recovery before surgery.  Breast cancer on tamoxifen  therapy Currently on tamoxifen  therapy since July 2022 for breast cancer. Discussion about potential change in medication post-hysterectomy if ovaries are removed. Current plan is to continue tamoxifen  as ovaries are not being removed. Confirmed that tamoxifen  is the appropriate therapy as she is not postmenopausal and ovaries will remain post-surgery. - Continue tamoxifen  therapy. - Hold tamoxifen  one week before hysterectomy. - Restart tamoxifen  one month after surgery if ovaries are not removed.  Plan - Lab reviewed, hemoglobin 5.7 today, she is scheduled for 2 unit of blood  transfusion and IV Venofer  200 mg tomorrow in our office - Will schedule IV Venofer  200 mg twice a week for 2 next 2 weeks at [facility] Office - He she is scheduled for hysterectomy on December 24, 2023, cleared to stop tamoxifen  2 weeks before surgery, and restart months after - Follow-up in 3 months. - Repeat lab in a month, to make sure anemia resolves before surgery.   SUMMARY OF ONCOLOGIC HISTORY: Oncology History Overview Note  Cancer Staging Malignant neoplasm of upper-inner quadrant of right breast in female, estrogen receptor positive (HCC) Staging form: Breast, AJCC 8th Edition - Clinical stage from 10/06/2020: Stage IB (cT2, cN0, cM0, G2, ER+, PR+, HER2-) - Signed by [name], MD on 10/17/2020 Stage prefix: Initial diagnosis Histologic grading system: 3 grade system - Pathologic stage from 12/25/2020: No Stage Recommended (ypT2, pN27mi, cM0, G2, ER+, PR+, HER2-) - Signed by [name], MD on 01/22/2021 Stage prefix: Post-therapy Histologic grading system: 3 grade system Residual tumor (R): R0 - None    Malignant neoplasm of upper-inner quadrant of right breast in female, estrogen receptor positive (HCC)  10/06/2020 Mammogram   DIGITAL DIAGNOSTIC BILATERAL MAMMOGRAM WITH TOMOSYNTHESIS AND CAD; ULTRASOUND RIGHT BREAST LIMITED  IMPRESSION: Two contiguous hypoechoic masses in the right breast- an irregular hypoechoic mass in the right breast at 12:30 3 cm from the nipple measuring 2.5 x 2.1 x 3.0 cm, and a hypoechoic heterogeneous mass at 12:30 2 cm from the nipple measuring 3.0 x 1.6 x 2.8 cm. Sonographic evaluation of the right axilla does not show any enlarged adenopathy.   10/06/2020 Pathology Results   Diagnosis Breast, right, needle core biopsy - INVASIVE MAMMARY CARCINOMA WITH LOBULAR FEATURES - SEE COMMENT Microscopic Comment  The biopsy material shows an infiltrative proliferation of cells with large vesicular nuclei with inconspicuous nucleoli, arranged linearly and in  small clusters. Based on the biopsy, the carcinoma appears Nottingham grade 2 of 3 and measures 0.9 cm in greatest linear extent  E-cadherin is POSITIVE supporting a ductal origin.  PROGNOSTIC INDICATORS Results: IMMUNOHISTOCHEMICAL AND MORPHOMETRIC ANALYSIS PERFORMED MANUALLY The tumor cells are NEGATIVE for Her2 (1+). Estrogen Receptor: 80%, POSITIVE, STRONG STAINING INTENSITY Progesterone Receptor: 90%, POSITIVE, STRONG STAINING INTENSITY Proliferation Marker Ki67: 10%   10/06/2020 Cancer Staging   Staging form: Breast, AJCC 8th Edition - Clinical stage from 10/06/2020: Stage IB (cT2, cN0, cM0, G2, ER+, PR+, HER2-) - Signed by [name], MD on 10/17/2020 Stage prefix: Initial diagnosis Histologic grading system: 3 grade system   10/06/2020 Oncotype testing   RS of 19, distant recurrence risk of 6%, chemotherapy not recommended   10/13/2020 Initial Diagnosis   Malignant neoplasm of upper-inner quadrant of right breast in female, estrogen receptor positive (HCC)   10/24/2020 Genetic Testing   Negative hereditary cancer genetic testing: no pathogenic variants detected in [facility] BRCAPlus Panel and [facility] CancerNext-Expanded +RNAinsight.  Variants of uncertain significance detected in FH at p.Y2H (c.4T>C) and SUFU at  p.A138T (c.412G>A). The report dates are October 24, 2020 and October 27, 2020, respectively.    FH p.Y2H (c.4T>C) variant of uncertain significance was reclassified to likely benign. Report date is 03/15/2021. SUFU p.A138T (c.412G>A) variant of uncertain significance was reclassified to benign. Report date is 10/05/2021.  The BRCAplus panel offered by [employer] and includes sequencing and deletion/duplication analysis for the following 8 genes: ATM, BRCA1, BRCA2, CDH1, CHEK2, PALB2, PTEN, and TP53.  The CancerNext-Expanded gene panel offered by [facility] and includes sequencing, rearrangement, and RNA analysis for the following 77 genes: AIP, ALK, APC, ATM, AXIN2, BAP1, BARD1,  BLM, BMPR1A, BRCA1, BRCA2, BRIP1, CDC73, CDH1, CDK4, CDKN1B, CDKN2A, CHEK2, CTNNA1, DICER1, FANCC, FH, FLCN, GALNT12, KIF1B, LZTR1, MAX, MEN1, MET, MLH1, MSH2, MSH3, MSH6, MUTYH, NBN, NF1, NF2, NTHL1, PALB2, PHOX2B, PMS2, POT1, PRKAR1A, PTCH1, PTEN, RAD51C, RAD51D, RB1, RECQL, RET, SDHA, SDHAF2, SDHB, SDHC, SDHD, SMAD4, SMARCA4, SMARCB1, SMARCE1, STK11, SUFU, TMEM127, TP53, TSC1, TSC2, VHL and XRCC2 (sequencing and deletion/duplication); EGFR, EGLN1, HOXB13, KIT, MITF, PDGFRA, POLD1, and POLE (sequencing only); EPCAM and GREM1 (deletion/duplication only).     11/09/2020 Imaging   Breast MRI  IMPRESSION: 1. Known biopsy-proven invasive carcinoma within the upper inner quadrant of the RIGHT breast, measuring 3.6 cm, containing a biopsy clip. Contiguous linear non-mass enhancement extends superior-lateral to the spiculated mass, increasing overall craniocaudal dimension to 3 cm. 2. Additional irregular enhancing mass within the inner RIGHT breast, 3 o'clock axis region, at anterior depth, measuring 7 mm, with suspicious washout kinetics. MRI-guided biopsy is recommended to exclude multifocal disease. 3. Additional linear clumped non-mass enhancement within the upper inner quadrant of the LEFT breast, at posterior depth, measuring 1.8 cm extent, with suspicious washout kinetics. This may represent an intramammary lymph node. MRI-guided biopsy is recommended to exclude contralateral disease.   12/25/2020 Cancer Staging   Staging form: Breast, AJCC 8th Edition - Pathologic stage from 12/25/2020: No Stage Recommended (ypT2, pN39mi, cM0, G2, ER+, PR+, HER2-) - Signed by [name], MD on 01/22/2021 Stage prefix: Post-therapy Histologic grading system: 3 grade system Residual tumor (R): R0 - None   12/25/2020 Definitive Surgery   FINAL MICROSCOPIC DIAGNOSIS:   A. BREAST, RIGHT, MASTECTOMY:  - Invasive ductal carcinoma with lobular features, grade 2, spanning 3.1 cm.  -  Intermediate grade ductal  carcinoma in situ.  - Margins are negative for carcinoma.  - Lymphovascular invasion.  - Biopsy site.  - Fibroadenomatoid change.  - Pseudoangiomatous stromal hyperplasia.  - Intraductal papilloma.  - See oncology table.   B. LYMPH NODE, RIGHT AXILLARY, SENTINEL EXCISION:  - Isolated tumor cells in one of one lymph node (0/1).   C. LYMPH NODE, RIGHT AXILLARY, SENTINEL EXCISION:  - One of one lymph nodes negative for carcinoma. (0/1).   04/19/2021 Survivorship   SCP delivered by Lacie Burton, NP      Discussed the use of AI scribe software for clinical note transcription with the patient, who gave verbal consent to proceed.  History of Present Illness Connie Underwood is a 48 year old female with iron  deficiency anemia who presents for a follow-up visit.  She experiences significant fatigue, likely due to her low hemoglobin level of 5.7 g/dL. There is no chest pain or discomfort with exertion.  She has heavy menstrual periods, necessitating frequent clothing changes, and is scheduled for a hysterectomy in September.  There was a lapse in her IV iron  treatments since March due to a miscommunication, interrupting her monthly treatment schedule.  She is currently on tamoxifen  for breast cancer, initiated in July 2022.     All other systems were reviewed with the patient and are negative.  MEDICAL HISTORY:  Past Medical History:  Diagnosis Date   Anemia    Anxiety    Breast cancer (HCC)    right   Depression    Family history of prostate cancer 10/18/2020   Headache    migraines   Hx of migraines    Pre-diabetes    Seasonal allergic reaction     SURGICAL HISTORY: Past Surgical History:  Procedure Laterality Date   BREAST RECONSTRUCTION WITH PLACEMENT OF TISSUE EXPANDER AND FLEX HD (ACELLULAR HYDRATED DERMIS) Right 12/25/2020   Procedure: RIGHT BREAST RECONSTRUCTION WITH PLACEMENT OF TISSUE EXPANDER AND FLEX HD (ACELLULAR HYDRATED DERMIS);  Surgeon: Elisabeth Craig RAMAN, MD;  Location: [facility] OR;  Service: Plastics;  Laterality: Right;   IR ANGIO EXTERNAL CAROTID SEL EXT CAROTID BILAT MOD SED  07/03/2022   IR ANGIO INTRA EXTRACRAN SEL INTERNAL CAROTID BILAT MOD SED  07/03/2022   IR ANGIO VERTEBRAL SEL VERTEBRAL UNI L MOD SED  07/03/2022   LIPOSUCTION WITH LIPOFILLING Right 12/11/2021   Procedure: Fat grafting to right breast from abdomen;  Surgeon: Marene Sieving, MD;  Location: [facility];  Service: Plastics;  Laterality: Right;   MASTECTOMY Right 2022   MASTECTOMY W/ SENTINEL NODE BIOPSY Right 12/25/2020   Procedure: RIGHT MASTECTOMY WITH RIGHT AXILLARY SENTINEL LYMPH NODE BIOPSY;  Surgeon: [name], MD;  Location: [facility] OR;  Service: General;  Laterality: Right;   MASTOPEXY Left 03/12/2021   Procedure: LEFT MASTOPEXY;  Surgeon: Elisabeth Craig RAMAN, MD;  Location: [facility] OR;  Service: Plastics;  Laterality: Left;   RADIOLOGY WITH ANESTHESIA N/A 07/03/2022   Procedure: IR WITH ANESTHESIA;  Surgeon: Lanis Pupa, MD;  Location: [facility] OR;  Service: Radiology;  Laterality: N/A;   REDUCTION MAMMAPLASTY Left 2022   REMOVAL OF BILATERAL TISSUE EXPANDERS WITH PLACEMENT OF BILATERAL BREAST IMPLANTS Right 03/12/2021   Procedure: REMOVAL OF RIGHT TISSUE EXPANDERS WITH PLACEMENT OF RIGHT  BREAST IMPLANTS;  Surgeon: Elisabeth Craig RAMAN, MD;  Location: [facility] OR;  Service: Plastics;  Laterality: Right;   TUBAL LIGATION      I have reviewed the social history and family history with  the patient and they are unchanged from previous note.  ALLERGIES:  is allergic to tomato and pollen extract.  MEDICATIONS:  Current Outpatient Medications  Medication Sig Dispense Refill   ACCRUFER  30 MG CAPS TAKE 1 CAPSULE (30 MG TOTAL) BY MOUTH DAILY AT 2 PM. 30 capsule 5   EMGALITY  120 MG/ML SOAJ Inject 1 mL into the skin every 30 (thirty) days.     fluconazole  (DIFLUCAN ) 150 MG tablet SMARTSIG:1 Tablet(s) By Mouth     Semaglutide -Weight Management (WEGOVY ) 0.25  MG/0.5ML SOAJ Inject 0.25 mg into the skin once a week. 2 mL 0   Semaglutide -Weight Management (WEGOVY ) 0.5 MG/0.5ML SOAJ Inject 0.5 mg into the skin once a week. 2 mL 0   Semaglutide -Weight Management (WEGOVY ) 1 MG/0.5ML SOAJ Inject 1 mg into the skin once a week. 2 mL 0   Semaglutide -Weight Management (WEGOVY ) 1.7 MG/0.75ML SOAJ Inject 1.7 mg into the skin once a week. 3 mL 0   Semaglutide -Weight Management (WEGOVY ) 2.4 MG/0.75ML SOAJ Inject 2.4 mg into the skin once a week. 3 mL 0   Semaglutide -Weight Management (WEGOVY ) 2.4 MG/0.75ML SOAJ Inject 2.4 mg into the skin once a week. 3 mL 0   SUMAtriptan  (IMITREX ) 50 MG tablet Take 50 mg by mouth every 2 (two) hours as needed for migraine.     tamoxifen  (NOLVADEX ) 20 MG tablet TAKE 1 TABLET BY MOUTH EVERY DAY 90 tablet 2   topiramate (TOPAMAX) 25 MG tablet Take 25 mg by mouth 2 (two) times daily.     Vitamin D , Ergocalciferol , (DRISDOL ) 1.25 MG (50000 UNIT) CAPS capsule Take 50,000 Units by mouth once a week.     WEGOVY  2.4 MG/0.75ML SOAJ Inject 2.4 mg into the skin once a week. 3 mL 0   No current facility-administered medications for this visit.    PHYSICAL EXAMINATION: ECOG PERFORMANCE STATUS: 1 - Symptomatic but completely ambulatory  Vitals:   11/04/23 1440  BP: (!) 120/58  Pulse: (!) 103  Resp: 15  Temp: 98.7 F (37.1 C)  SpO2: 98%   Wt Readings from Last 3 Encounters:  11/04/23 191 lb 6.4 oz (86.8 kg)  06/12/23 208 lb (94.3 kg)  06/09/23 212 lb 6.4 oz (96.3 kg)     GENERAL:alert, no distress and comfortable SKIN: skin color, texture, turgor are normal, no rashes or significant lesions EYES: normal, Conjunctiva are pink and non-injected, sclera clear NECK: supple, thyroid normal size, non-tender, without nodularity LYMPH:  no palpable lymphadenopathy in the cervical, axillary  LUNGS: clear to auscultation and percussion with normal breathing effort HEART: regular rate & rhythm and no murmurs and no lower extremity  edema ABDOMEN:abdomen soft, non-tender and normal bowel sounds Musculoskeletal:no cyanosis of digits and no clubbing  NEURO: alert & oriented x 3 with fluent speech, no focal motor/sensory deficits  Physical Exam    LABORATORY DATA:  I have reviewed the data as listed    Latest Ref Rng & Units 11/04/2023    2:13 PM 06/09/2023    7:55 AM 04/07/2023    7:47 AM  CBC  WBC 4.0 - 10.5 K/uL 7.9  7.9  8.2   Hemoglobin 12.0 - 15.0 g/dL 5.7  9.3  9.4   Hematocrit 36.0 - 46.0 % 20.4  30.9  31.4   Platelets 150 - 400 K/uL 417  392  432         Latest Ref Rng & Units 11/04/2023    2:13 PM 06/09/2023    7:55 AM 04/07/2023    7:47  AM  CMP  Glucose 70 - 99 mg/dL 95  88  95   BUN 6 - 20 mg/dL 9  10  6    Creatinine 0.44 - 1.00 mg/dL 9.21  9.24  9.21   Sodium 135 - 145 mmol/L 141  139  140   Potassium 3.5 - 5.1 mmol/L 3.3  3.6  3.5   Chloride 98 - 111 mmol/L 112  109  108   CO2 22 - 32 mmol/L 25  25  24    Calcium 8.9 - 10.3 mg/dL 8.3  8.5  8.4   Total Protein 6.5 - 8.1 g/dL 6.5  6.5  6.6   Total Bilirubin 0.0 - 1.2 mg/dL 0.4  0.3  0.3   Alkaline Phos 38 - 126 U/L 38  42  46   AST 15 - 41 U/L 11  12  14    ALT 0 - 44 U/L 5  6  7        RADIOGRAPHIC STUDIES: I have personally reviewed the radiological images as listed and agreed with the findings in the report. No results found.    No orders of the defined types were placed in this encounter.  All questions were answered. The patient knows to call the clinic with any problems, questions or concerns. No barriers to learning was detected. The total time spent in the appointment was 30 minutes, including review of chart and various tests results, discussions about plan of care and coordination of care plan     Onita Mattock, MD 11/04/2023

## 2023-11-04 NOTE — Progress Notes (Signed)
 Critical lab value reported: Hbg 5.7 BB Hold on hand and sent to Blood Bank.  Dr Lanny verbally notified of critical lab value.  Pt scheduled to get 2 units of PRBCs on 11/05/2023.

## 2023-11-04 NOTE — Assessment & Plan Note (Signed)
-  long-standing h/o chronic anemia secondary to iron  deficiency from menorrhagia -she has received IV Venofer  as needed since 10/2020

## 2023-11-05 ENCOUNTER — Telehealth: Payer: Self-pay

## 2023-11-05 ENCOUNTER — Ambulatory Visit

## 2023-11-05 ENCOUNTER — Other Ambulatory Visit: Payer: Self-pay

## 2023-11-05 ENCOUNTER — Ambulatory Visit: Payer: Self-pay | Admitting: Nurse Practitioner

## 2023-11-05 VITALS — BP 115/68 | HR 67 | Temp 98.8°F | Resp 14

## 2023-11-05 DIAGNOSIS — D500 Iron deficiency anemia secondary to blood loss (chronic): Secondary | ICD-10-CM | POA: Diagnosis not present

## 2023-11-05 DIAGNOSIS — Z170 Estrogen receptor positive status [ER+]: Secondary | ICD-10-CM

## 2023-11-05 DIAGNOSIS — D649 Anemia, unspecified: Secondary | ICD-10-CM

## 2023-11-05 MED ORDER — SODIUM CHLORIDE 0.9 % IV SOLN
INTRAVENOUS | Status: DC
Start: 2023-11-05 — End: 2023-11-05

## 2023-11-05 MED ORDER — SODIUM CHLORIDE 0.9% IV SOLUTION
250.0000 mL | INTRAVENOUS | Status: DC
  Administered 2023-11-05: 100 mL via INTRAVENOUS

## 2023-11-05 MED ORDER — ACETAMINOPHEN 325 MG PO TABS
650.0000 mg | ORAL_TABLET | Freq: Once | ORAL | Status: AC
  Administered 2023-11-05: 650 mg via ORAL
  Filled 2023-11-05: qty 2

## 2023-11-05 MED ORDER — IRON SUCROSE 20 MG/ML IV SOLN
200.0000 mg | Freq: Once | INTRAVENOUS | Status: AC
  Administered 2023-11-05: 200 mg via INTRAVENOUS
  Filled 2023-11-05: qty 10

## 2023-11-05 MED ORDER — CETIRIZINE HCL 10 MG/ML IV SOLN
10.0000 mg | Freq: Once | INTRAVENOUS | Status: AC
  Administered 2023-11-05: 10 mg via INTRAVENOUS
  Filled 2023-11-05: qty 1

## 2023-11-05 NOTE — Progress Notes (Signed)
 Pt observed for 30 minutes post Venofer  200 mg injection. Pt tolerated Tx well w/out incident.

## 2023-11-05 NOTE — Patient Instructions (Signed)
 Iron  Sucrose Injection What is this medication? IRON  SUCROSE (EYE ern SOO krose) treats low levels of iron  (iron  deficiency anemia) in people with kidney disease. Iron  is a mineral that plays an important role in making red blood cells, which carry oxygen from your lungs to the rest of your body. This medicine may be used for other purposes; ask your health care provider or pharmacist if you have questions. COMMON BRAND NAME(S): Venofer  What should I tell my care team before I take this medication? They need to know if you have any of these conditions: Anemia not caused by low iron  levels Heart disease High levels of iron  in the blood Kidney disease Liver disease An unusual or allergic reaction to iron , other medications, foods, dyes, or preservatives Pregnant or trying to get pregnant Breastfeeding How should I use this medication? This medication is infused into a vein. It is given by your care team in a hospital or clinic setting. Talk to your care team about the use of this medication in children. While it may be prescribed for children as young as 2 years for selected conditions, precautions do apply. Overdosage: If you think you have taken too much of this medicine contact a poison control center or emergency room at once. NOTE: This medicine is only for you. Do not share this medicine with others. What if I miss a dose? Keep appointments for follow-up doses. It is important not to miss your dose. Call your care team if you are unable to keep an appointment. What may interact with this medication? Do not take this medication with any of the following: Deferoxamine Dimercaprol Other iron  products This medication may also interact with the following: Chloramphenicol Deferasirox This list may not describe all possible interactions. Give your health care provider a list of all the medicines, herbs, non-prescription drugs, or dietary supplements you use. Also tell them if you smoke,  drink alcohol, or use illegal drugs. Some items may interact with your medicine. What should I watch for while using this medication? Visit your care team for regular checks on your progress. Tell your care team if your symptoms do not start to get better or if they get worse. You may need blood work done while you are taking this medication. You may need to eat more foods that contain iron . Talk to your care team. Foods that contain iron  include whole grains or cereals, dried fruits, beans, peas, leafy green vegetables, and organ meats (liver, kidney). What side effects may I notice from receiving this medication? Side effects that you should report to your care team as soon as possible: Allergic reactions--skin rash, itching, hives, swelling of the face, lips, tongue, or throat Low blood pressure--dizziness, feeling faint or lightheaded, blurry vision Shortness of breath Side effects that usually do not require medical attention (report to your care team if they continue or are bothersome): Flushing Headache Joint pain Muscle pain Nausea Pain, redness, or irritation at injection site This list may not describe all possible side effects. Call your doctor for medical advice about side effects. You may report side effects to FDA at 1-800-FDA-1088. Where should I keep my medication? This medication is given in a hospital or clinic. It will not be stored at home. NOTE: This sheet is a summary. It may not cover all possible information. If you have questions about this medicine, talk to your doctor, pharmacist, or health care provider. Blood Transfusion, Adult, Care After After a blood transfusion, it is common to have:  Bruising and soreness at the IV site. A headache. Follow these instructions at home: Your doctor may give you more instructions. If you have problems, contact your doctor. Insertion site care     Follow instructions from your doctor about how to take care of your insertion  site. This is where an IV tube was put into your vein. Make sure you: Wash your hands with soap and water for at least 20 seconds before and after you change your bandage. If you cannot use soap and water, use hand sanitizer. Change your bandage as told by your doctor. Check your insertion site every day for signs of infection. Check for: Redness, swelling, or pain. Bleeding from the site. Warmth. Pus or a bad smell. General instructions Take over-the-counter and prescription medicines only as told by your doctor. Rest as told by your doctor. Go back to your normal activities as told by your doctor. Keep all follow-up visits. You may need to have tests at certain times to check your blood. Contact a doctor if: You have itching or red, swollen areas of skin (hives). You have a fever or chills. You have pain in the head, back, or chest. You feel worried or nervous (anxious). You feel weak after doing your normal activities. You have any of these problems at the insertion site: Redness, swelling, warmth, or pain. Bleeding that does not stop with pressure. Pus or a bad smell. If you received your blood transfusion in an outpatient setting, you will be told whom to contact to report any reactions. Get help right away if: You have signs of a serious reaction. This may be coming from an allergy or the body's defense system (immune system). Signs include: Trouble breathing or shortness of breath. Swelling of the face or feeling warm (flushed). A widespread rash. Dark pee (urine) or blood in the pee. Fast heartbeat. These symptoms may be an emergency. Get help right away. Call 911. Do not wait to see if the symptoms will go away. Do not drive yourself to the hospital. Summary Bruising and soreness at the IV site are common. Check your insertion site every day for signs of infection. Rest as told by your doctor. Go back to your normal activities as told by your doctor. Get help right away  if you have signs of a serious reaction. This information is not intended to replace advice given to you by your health care provider. Make sure you discuss any questions you have with your health care provider. Document Revised: 06/15/2021 Document Reviewed: 06/15/2021 Elsevier Patient Education  2024 Elsevier Inc.  2024 Elsevier/Gold Standard (2022-11-06 00:00:00)

## 2023-11-05 NOTE — Telephone Encounter (Signed)
 Dr. Lanny and Anders, patient will be scheduled as soon as possible.  Auth Submission: NO AUTH NEEDED Site of care: Site of care: [facility] Payer: [employer] commercial Medication & CPT/J Code(s) submitted: Venofer  (Iron  Sucrose) J1756 Diagnosis Code:  Route of submission (phone, fax, portal):  Phone # Fax # Auth type: Buy/Bill PB Units/visits requested: 200mg  x 5 doses Reference number:  Approval from: 11/05/23 to 03/06/24

## 2023-11-06 ENCOUNTER — Telehealth: Payer: Self-pay | Admitting: Hematology

## 2023-11-06 LAB — BPAM RBC
Blood Product Expiration Date: 202509062359
Blood Product Unit Number: 202509032359
ISSUE DATE / TIME: 202508061003
PRODUCT CODE: 202508061003
PRODUCT CODE: 202509062359
Unit Type and Rh: 5100
Unit Type and Rh: 202509032359
Unit Type and Rh: 5100
Unit Type and Rh: 5100

## 2023-11-06 LAB — TYPE AND SCREEN
ABO/RH(D): O POS
Antibody Screen: NEGATIVE
Unit division: 0
Unit division: 0

## 2023-11-06 NOTE — Telephone Encounter (Signed)
 Scheduled appointments per los. Called and left a VM with appointment details for the patient.

## 2023-11-12 ENCOUNTER — Ambulatory Visit

## 2023-11-12 VITALS — BP 112/74 | HR 75 | Temp 97.9°F | Resp 18 | Ht 64.0 in | Wt 186.6 lb

## 2023-11-12 DIAGNOSIS — C50211 Malignant neoplasm of upper-inner quadrant of right female breast: Secondary | ICD-10-CM

## 2023-11-12 DIAGNOSIS — Z170 Estrogen receptor positive status [ER+]: Secondary | ICD-10-CM

## 2023-11-12 DIAGNOSIS — N920 Excessive and frequent menstruation with regular cycle: Secondary | ICD-10-CM | POA: Diagnosis not present

## 2023-11-12 DIAGNOSIS — D500 Iron deficiency anemia secondary to blood loss (chronic): Secondary | ICD-10-CM | POA: Diagnosis not present

## 2023-11-12 MED ORDER — IRON SUCROSE 20 MG/ML IV SOLN
200.0000 mg | Freq: Once | INTRAVENOUS | Status: AC
  Administered 2023-11-12 (×2): 200 mg via INTRAVENOUS
  Filled 2023-11-12: qty 10

## 2023-11-12 MED ORDER — DIPHENHYDRAMINE HCL 25 MG PO CAPS: 25.0000 mg | ORAL_CAPSULE | Freq: Once | ORAL | Status: DC

## 2023-11-12 MED ORDER — ACETAMINOPHEN 325 MG PO TABS: 650.0000 mg | ORAL_TABLET | Freq: Once | ORAL | Status: DC

## 2023-11-12 NOTE — Progress Notes (Signed)
 Diagnosis: Iron Deficiency Anemia  Provider:  Chilton Greathouse MD  Procedure: IV Push  IV Type: Peripheral, IV Location: L Antecubital  Venofer (Iron Sucrose), Dose: 200 mg  Post Infusion IV Care: Patient declined observation and Peripheral IV Discontinued  Discharge: Condition: Good, Destination: Home . AVS Declined  Performed by:  Adriana Mccallum, RN   Patient refused pre-medications. Nurse educated patient and stressed the importance of taking pre-medications as a precaution in the event of a medication reaction. Patient verbalized understanding.

## 2023-11-13 ENCOUNTER — Other Ambulatory Visit (HOSPITAL_COMMUNITY): Payer: Self-pay

## 2023-11-14 ENCOUNTER — Other Ambulatory Visit (HOSPITAL_COMMUNITY): Payer: Self-pay

## 2023-11-14 ENCOUNTER — Ambulatory Visit

## 2023-11-14 MED ORDER — ACETAMINOPHEN 325 MG PO TABS: 650.0000 mg | ORAL_TABLET | Freq: Once | ORAL | Status: AC

## 2023-11-14 MED ORDER — IRON SUCROSE 20 MG/ML IV SOLN
200.0000 mg | Freq: Once | INTRAVENOUS | Status: AC
Start: 2023-11-14 — End: ?

## 2023-11-14 MED ORDER — DIPHENHYDRAMINE HCL 25 MG PO CAPS
25.0000 mg | ORAL_CAPSULE | Freq: Once | ORAL | Status: AC
Start: 2023-11-14 — End: ?

## 2023-11-18 ENCOUNTER — Ambulatory Visit (INDEPENDENT_AMBULATORY_CARE_PROVIDER_SITE_OTHER)

## 2023-11-18 VITALS — BP 108/72 | HR 69 | Temp 98.3°F | Resp 18 | Ht 64.0 in | Wt 185.4 lb

## 2023-11-18 DIAGNOSIS — N920 Excessive and frequent menstruation with regular cycle: Secondary | ICD-10-CM | POA: Diagnosis not present

## 2023-11-18 DIAGNOSIS — Z170 Estrogen receptor positive status [ER+]: Secondary | ICD-10-CM

## 2023-11-18 DIAGNOSIS — D500 Iron deficiency anemia secondary to blood loss (chronic): Secondary | ICD-10-CM

## 2023-11-18 MED ORDER — ACETAMINOPHEN 325 MG PO TABS: 650.0000 mg | ORAL_TABLET | Freq: Once | ORAL | Status: DC

## 2023-11-18 MED ORDER — DIPHENHYDRAMINE HCL 25 MG PO CAPS: 25.0000 mg | ORAL_CAPSULE | Freq: Once | ORAL | Status: DC

## 2023-11-18 MED ORDER — IRON SUCROSE 20 MG/ML IV SOLN
200.0000 mg | Freq: Once | INTRAVENOUS | Status: AC
Start: 2023-11-18 — End: 2023-11-18
  Administered 2023-11-18: 200 mg via INTRAVENOUS
  Filled 2023-11-18: qty 10

## 2023-11-18 NOTE — Progress Notes (Signed)
 Diagnosis: Iron Deficiency Anemia  Provider:  Chilton Greathouse MD  Procedure: IV Push  IV Type: Peripheral, IV Location: L Antecubital  Venofer (Iron Sucrose), Dose: 200 mg  Post Infusion IV Care: Patient declined observation and Peripheral IV Discontinued  Discharge: Condition: Good, Destination: Home . AVS Declined  Performed by:  Adriana Mccallum, RN   Patient refused pre-medications. Nurse educated patient and stressed the importance of taking pre-medications as a precaution in the event of a medication reaction. Patient verbalized understanding.

## 2023-11-20 ENCOUNTER — Encounter: Payer: Self-pay | Admitting: Hematology

## 2023-11-21 ENCOUNTER — Ambulatory Visit (INDEPENDENT_AMBULATORY_CARE_PROVIDER_SITE_OTHER): Admitting: *Deleted

## 2023-11-21 VITALS — BP 112/73 | HR 75 | Temp 98.5°F | Resp 16 | Ht 64.0 in | Wt 187.8 lb

## 2023-11-21 DIAGNOSIS — N920 Excessive and frequent menstruation with regular cycle: Secondary | ICD-10-CM | POA: Diagnosis not present

## 2023-11-21 DIAGNOSIS — Z170 Estrogen receptor positive status [ER+]: Secondary | ICD-10-CM

## 2023-11-21 DIAGNOSIS — D500 Iron deficiency anemia secondary to blood loss (chronic): Secondary | ICD-10-CM

## 2023-11-21 MED ORDER — IRON SUCROSE 20 MG/ML IV SOLN
200.0000 mg | Freq: Once | INTRAVENOUS | Status: AC
  Administered 2023-11-21: 200 mg via INTRAVENOUS
  Filled 2023-11-21: qty 10

## 2023-11-21 MED ORDER — DIPHENHYDRAMINE HCL 25 MG PO CAPS
25.0000 mg | ORAL_CAPSULE | Freq: Once | ORAL | Status: DC
Start: 2023-11-21 — End: 2023-11-21

## 2023-11-21 MED ORDER — ACETAMINOPHEN 325 MG PO TABS
650.0000 mg | ORAL_TABLET | Freq: Once | ORAL | Status: DC
Start: 2023-11-21 — End: 2023-11-21

## 2023-11-21 NOTE — Progress Notes (Signed)
 Diagnosis: Iron Deficiency Anemia  Provider:  Chilton Greathouse MD  Procedure: IV Push  IV Type: Peripheral, IV Location: L Antecubital  Venofer (Iron Sucrose), Dose: 200 mg  Post Infusion IV Care: Observation period completed and Peripheral IV Discontinued  Discharge: Condition: Good, Destination: Home . AVS Declined  Performed by:  Forrest Moron, RN

## 2023-11-26 ENCOUNTER — Ambulatory Visit

## 2023-11-26 VITALS — BP 110/72 | HR 74 | Temp 98.7°F | Resp 18 | Ht 64.0 in | Wt 188.0 lb

## 2023-11-26 DIAGNOSIS — C50211 Malignant neoplasm of upper-inner quadrant of right female breast: Secondary | ICD-10-CM

## 2023-11-26 DIAGNOSIS — N920 Excessive and frequent menstruation with regular cycle: Secondary | ICD-10-CM

## 2023-11-26 DIAGNOSIS — D500 Iron deficiency anemia secondary to blood loss (chronic): Secondary | ICD-10-CM

## 2023-11-26 MED ORDER — ACETAMINOPHEN 325 MG PO TABS: 650.0000 mg | ORAL_TABLET | Freq: Once | ORAL | Status: DC

## 2023-11-26 MED ORDER — IRON SUCROSE 20 MG/ML IV SOLN
200.0000 mg | Freq: Once | INTRAVENOUS | Status: AC
  Administered 2023-11-26: 200 mg via INTRAVENOUS
  Filled 2023-11-26: qty 10

## 2023-11-26 MED ORDER — DIPHENHYDRAMINE HCL 25 MG PO CAPS: 25.0000 mg | ORAL_CAPSULE | Freq: Once | ORAL | Status: DC

## 2023-11-26 NOTE — Progress Notes (Signed)
 Diagnosis: Iron  Deficiency Anemia  Provider:  Praveen Mannam MD  Procedure: IV Push  IV Type: Peripheral, IV Location: L Antecubital  Venofer  (Iron  Sucrose), Dose: 500 mg  Post Infusion IV Care: Patient declined observation and Peripheral IV Discontinued  Discharge: Condition: Good, Destination: Home . AVS Declined  Performed by:  Osias Resnick, RN

## 2023-11-28 ENCOUNTER — Other Ambulatory Visit (HOSPITAL_COMMUNITY): Payer: Self-pay

## 2023-11-28 MED ORDER — WEGOVY 2.4 MG/0.75ML ~~LOC~~ SOAJ
2.4000 mg | SUBCUTANEOUS | 0 refills | Status: AC
  Filled 2023-12-01 – 2023-12-11 (×2): qty 3, 28d supply, fill #0

## 2023-12-01 ENCOUNTER — Other Ambulatory Visit: Payer: Self-pay

## 2023-12-02 ENCOUNTER — Other Ambulatory Visit: Payer: Self-pay

## 2023-12-02 ENCOUNTER — Inpatient Hospital Stay: Attending: Hematology

## 2023-12-02 DIAGNOSIS — D509 Iron deficiency anemia, unspecified: Secondary | ICD-10-CM | POA: Diagnosis present

## 2023-12-02 DIAGNOSIS — N920 Excessive and frequent menstruation with regular cycle: Secondary | ICD-10-CM | POA: Insufficient documentation

## 2023-12-02 DIAGNOSIS — Z853 Personal history of malignant neoplasm of breast: Secondary | ICD-10-CM | POA: Diagnosis not present

## 2023-12-02 DIAGNOSIS — Z79811 Long term (current) use of aromatase inhibitors: Secondary | ICD-10-CM | POA: Diagnosis not present

## 2023-12-02 DIAGNOSIS — Z170 Estrogen receptor positive status [ER+]: Secondary | ICD-10-CM

## 2023-12-02 DIAGNOSIS — D649 Anemia, unspecified: Secondary | ICD-10-CM

## 2023-12-02 DIAGNOSIS — D500 Iron deficiency anemia secondary to blood loss (chronic): Secondary | ICD-10-CM

## 2023-12-02 LAB — CBC WITH DIFFERENTIAL (CANCER CENTER ONLY)
Abs Immature Granulocytes: 0.01 K/uL (ref 0.00–0.07)
Basophils Absolute: 0 K/uL (ref 0.0–0.1)
Basophils Relative: 1 %
Eosinophils Absolute: 0.2 K/uL (ref 0.0–0.5)
Eosinophils Relative: 3 %
HCT: 34.7 % — ABNORMAL LOW (ref 36.0–46.0)
Hemoglobin: 10.6 g/dL — ABNORMAL LOW (ref 12.0–15.0)
Immature Granulocytes: 0 %
Lymphocytes Relative: 18 %
Lymphs Abs: 1.2 K/uL (ref 0.7–4.0)
MCH: 23.6 pg — ABNORMAL LOW (ref 26.0–34.0)
MCHC: 30.5 g/dL (ref 30.0–36.0)
MCV: 77.3 fL — ABNORMAL LOW (ref 80.0–100.0)
Monocytes Absolute: 0.4 K/uL (ref 0.1–1.0)
Monocytes Relative: 6 %
Neutro Abs: 4.7 K/uL (ref 1.7–7.7)
Neutrophils Relative %: 72 %
Platelet Count: 343 K/uL (ref 150–400)
RBC: 4.49 MIL/uL (ref 3.87–5.11)
RDW: 27.9 % — ABNORMAL HIGH (ref 11.5–15.5)
WBC Count: 6.5 K/uL (ref 4.0–10.5)
nRBC: 0 % (ref 0.0–0.2)

## 2023-12-02 LAB — COMPREHENSIVE METABOLIC PANEL WITH GFR
ALT: 5 U/L (ref 0–44)
AST: 11 U/L — ABNORMAL LOW (ref 15–41)
Albumin: 3.8 g/dL (ref 3.5–5.0)
Alkaline Phosphatase: 35 U/L — ABNORMAL LOW (ref 38–126)
Anion gap: 7 (ref 5–15)
BUN: 7 mg/dL (ref 6–20)
CO2: 22 mmol/L (ref 22–32)
Calcium: 8.6 mg/dL — ABNORMAL LOW (ref 8.9–10.3)
Chloride: 110 mmol/L (ref 98–111)
Creatinine, Ser: 0.69 mg/dL (ref 0.44–1.00)
GFR, Estimated: >60 mL/min (ref >60)
Glucose, Bld: 87 mg/dL (ref 70–99)
Potassium: 3.9 mmol/L (ref 3.5–5.1)
Sodium: 139 mmol/L (ref 135–145)
Total Bilirubin: 0.9 mg/dL (ref 0.0–1.2)
Total Protein: 6.6 g/dL (ref 6.5–8.1)

## 2023-12-02 LAB — FERRITIN: Ferritin: 198 ng/mL (ref 11–307)

## 2023-12-02 LAB — SAMPLE TO BLOOD BANK

## 2023-12-03 ENCOUNTER — Ambulatory Visit

## 2023-12-03 ENCOUNTER — Telehealth: Payer: Self-pay

## 2023-12-03 ENCOUNTER — Other Ambulatory Visit: Payer: Self-pay

## 2023-12-03 VITALS — BP 105/69 | HR 93 | Temp 99.1°F | Resp 16 | Ht 64.0 in | Wt 184.4 lb

## 2023-12-03 DIAGNOSIS — C50211 Malignant neoplasm of upper-inner quadrant of right female breast: Secondary | ICD-10-CM

## 2023-12-03 DIAGNOSIS — D500 Iron deficiency anemia secondary to blood loss (chronic): Secondary | ICD-10-CM | POA: Diagnosis not present

## 2023-12-03 MED ORDER — DIPHENHYDRAMINE HCL 25 MG PO CAPS: 25.0000 mg | ORAL_CAPSULE | Freq: Once | ORAL | Status: DC

## 2023-12-03 MED ORDER — ACETAMINOPHEN 325 MG PO TABS: 650.0000 mg | ORAL_TABLET | Freq: Once | ORAL | Status: DC

## 2023-12-03 MED ORDER — IRON SUCROSE 20 MG/ML IV SOLN
200.0000 mg | Freq: Once | INTRAVENOUS | Status: AC
  Administered 2023-12-03: 200 mg via INTRAVENOUS
  Filled 2023-12-03: qty 10

## 2023-12-03 NOTE — Progress Notes (Signed)
 Diagnosis: Iron Deficiency Anemia  Provider:  Chilton Greathouse MD  Procedure: IV Push  IV Type: Peripheral, IV Location: L Antecubital  Venofer (Iron Sucrose), Dose: 200 mg  Post Infusion IV Care: Patient declined observation and Peripheral IV Discontinued  Discharge: Condition: Good, Destination: Home . AVS Declined  Performed by:  Loney Hering, LPN

## 2023-12-03 NOTE — Telephone Encounter (Signed)
 Pt called wanting to know if she is OK to remove the BB Hold bracelet that was placed on her yesterday during lab draw.  Reviewed pt's labs and informed pt that her Hbg 10.6 and her Ferritin 198.  Stated pt is OK to remove BB Hold bracelet.  Pt verbalized understanding and had no further questions or concerns.

## 2023-12-10 ENCOUNTER — Ambulatory Visit: Payer: Self-pay | Admitting: Nurse Practitioner

## 2023-12-11 ENCOUNTER — Other Ambulatory Visit: Payer: Self-pay

## 2023-12-11 ENCOUNTER — Other Ambulatory Visit (HOSPITAL_COMMUNITY): Payer: Self-pay

## 2023-12-18 ENCOUNTER — Encounter (HOSPITAL_COMMUNITY): Payer: Self-pay | Admitting: Obstetrics and Gynecology

## 2023-12-18 NOTE — Progress Notes (Addendum)
 Addendum:  Routed abnormal CBC result done today 12-22-2023, to Dr Rosalva in epic .  Spoke w/ via phone for pre-op interview--- pt Lab needs dos----   upt/  t&s-- per anes  (pt had recent blood transfusion 11-05-2023)      Lab results------ lab appt 12-22-2023 @ 0930 getting CBC (per anes) COVID test -----patient states asymptomatic no test needed Arrive at -------  0530 on 12-24-2023 NPO after MN w/ sips of water  w/ meds Pre-Surgery Ensure or G2:  n/a  Med rec completed Medications to take morning of surgery ----- buspirone , lexapro , topamax, if needed imitrex  Diabetic medication ----- n/a  GLP1 agonist last dose: 12-12-2023 GLP1 instructions: pt stated was given instructions by dr rosalva  Patient instructed no nail polish to be worn day of surgery Patient instructed to bring photo id and insurance card day of surgery Patient aware to have Driver (ride ) / caregiver    for 24 hours after surgery - daughter, iyjnna browm (age 22) Patient Special Instructions ----- will pick up soap and written instructions at lab appt Pre-Op special Instructions -----  sent inbox message in epic to Dr Rosalva on 12-16-2023,  requested orders No BP in right arm s/p mastectomy and node dissection  Patient verbalized understanding of instructions that were given at this phone interview. Patient denies chest pain, sob, fever, cough at the interview.

## 2023-12-18 NOTE — Pre-Procedure Instructions (Signed)
 Surgical Instructions   Your procedure is scheduled on :  Wednesday,  12-24-2023. Report to [facility] Main Entrance A at 5:30  A.M., then check in with the Admitting office. Any questions or running late day of surgery: call [phone]  Questions prior to your surgery date: call [phone] Monday-Friday, 8am-4pm. If you experience any cold or flu symptoms such as cough, fever, chills, shortness of breath, etc. between now and your scheduled surgery, please notify your surgeon office.    Remember:  Do not eat any food and do not drink any liquids after midnight the night before your surgery.  This includes no water,  candy,  gum,  and  mints.    Take these medicines the morning of surgery with A SIPS OF WATER : Buspirone (buspar) Topiramate (topamax) Escitalopram  (lexapro )   May take these medicines IF NEEDED: Sumatriptan  (imitrex )   One week prior to surgery, STOP taking any Aspirin (unless otherwise instructed by your surgeon) Aleve , Naproxen , Ibuprofen , Motrin , Advil , Goody's, BC's, all herbal medications, fish oil, and non-prescription vitamins.                     Do NOT Smoke (Tobacco/Vaping) and Do Not drink alcohol for 24 hours prior to your procedure.  If you use a CPAP at night, you may bring your mask/headgear for your overnight stay.   You will be asked to remove any contacts, glasses, piercing's, hearing aid's, dentures/partials prior to surgery. Please bring cases/ contact solution/ etc.,  for these items day of surgery.   Patients discharged the day of surgery will not be allowed to drive home, and someone needs to stay with them for 24 hours.  SURGICAL WAITING ROOM VISITATION Patients may have no more than 2 support people in the waiting area - these visitors may rotate.   Pre-op nurse will coordinate an appropriate time for 1 ADULT support person, who may not rotate, to accompany patient in pre-op.  Children under the age of 34 must have an adult with  them who is not the patient and must remain in the main waiting area with an adult.  If the patient needs to stay at the hospital during part of their recovery, the visitor guidelines for inpatient rooms apply.  Please refer to the [facility] website for the visitor guidelines for any additional information.   If you received a COVID test during your pre-op visit  it is requested that you wear a mask when out in public, stay away from anyone that may not be feeling well and notify your surgeon if you develop symptoms. If you have been in contact with anyone that has tested positive in the last 10 days please notify you surgeon.      Pre-operative CHG Bathing Instructions   You can play a key role in reducing the risk of infection after surgery. Your skin needs to be as free of germs as possible. You can reduce the number of germs on your skin by washing with CHG (chlorhexidine  gluconate) soap before surgery. CHG is an antiseptic soap that kills germs and continues to kill germs even after washing.   DO NOT use if you have an allergy to chlorhexidine /CHG or antibacterial soaps. If your skin becomes reddened or irritated, stop using the CHG and notify one of our RN day of surgery.             TAKE A SHOWER THE NIGHT BEFORE SURGERY AND THE DAY OF SURGERY  Please keep in mind the following:  DO NOT shave, including legs and genital area, 48 hours prior to surgery.   You may shave your face before/day of surgery.  Place clean sheets on your bed the night before surgery Use a clean washcloth (not used since being washed) for each shower. DO NOT sleep with pet's night before surgery.  CHG Shower Instructions:  Wash your face and private area with normal soap. If you choose to wash your hair, wash first with your normal shampoo.  After you use shampoo/soap, rinse your hair and body thoroughly to remove shampoo/soap residue.  Turn the water OFF and apply half the bottle of CHG soap to a CLEAN  washcloth.  Apply CHG soap ONLY FROM YOUR NECK DOWN TO YOUR TOES (washing for 3-5 minutes)  DO NOT use CHG soap on face, private areas, open wounds, or sores.  Pay special attention to the area where your surgery is being performed.  If you are having back surgery, having someone wash your back for you may be helpful. Wait 2 minutes after CHG soap is applied, then you may rinse off the CHG soap.  Pat dry with a clean towel  Put on clean pajamas    Additional instructions for the day of surgery: DO NOT APPLY any lotions,  powder,  oils,  deodorants (may use underarm deodorant) , cologne / perfumes  or makeup Do not wear jewelry / piercing's/ metal/ permanent jewelry must be removed prior to arrival day of surgery.  (No plastic piercing) Do not wear nail polish, gel polish, artificial nails, or any other type of covering on natural finger nails (toe nails are okay) Do not bring valuables to the hospital. [facility] is not responsible for valuables/personal belongings. Put on clean/comfortable clothes.  Please brush your teeth and rinse mouth out. Ask your nurse before applying any prescription medications to the skin.

## 2023-12-22 ENCOUNTER — Encounter (HOSPITAL_COMMUNITY)
Admission: RE | Admit: 2023-12-22 | Discharge: 2023-12-22 | Disposition: A | Discharge status: Home / Self Care | Source: Ambulatory Visit | Attending: Obstetrics and Gynecology

## 2023-12-22 DIAGNOSIS — Z01812 Encounter for preprocedural laboratory examination: Secondary | ICD-10-CM | POA: Insufficient documentation

## 2023-12-22 DIAGNOSIS — Z01818 Encounter for other preprocedural examination: Secondary | ICD-10-CM

## 2023-12-22 LAB — CBC
HCT: 32.1 % — ABNORMAL LOW (ref 36.0–46.0)
Hemoglobin: 9.7 g/dL — ABNORMAL LOW (ref 12.0–15.0)
MCH: 25.5 pg — ABNORMAL LOW (ref 26.0–34.0)
MCHC: 30.2 g/dL (ref 30.0–36.0)
MCV: 84.5 fL (ref 80.0–100.0)
Platelets: 456 K/uL — ABNORMAL HIGH (ref 150–400)
RBC: 3.8 MIL/uL — ABNORMAL LOW (ref 3.87–5.11)
RDW: 24.9 % — ABNORMAL HIGH (ref 11.5–15.5)
WBC: 6.1 K/uL (ref 4.0–10.5)
nRBC: 0 % (ref 0.0–0.2)

## 2023-12-23 ENCOUNTER — Other Ambulatory Visit: Payer: Self-pay | Admitting: Obstetrics and Gynecology

## 2023-12-23 DIAGNOSIS — D219 Benign neoplasm of connective and other soft tissue, unspecified: Secondary | ICD-10-CM

## 2023-12-24 ENCOUNTER — Inpatient Hospital Stay (HOSPITAL_COMMUNITY): Payer: Self-pay | Admitting: Anesthesiology

## 2023-12-24 ENCOUNTER — Encounter (HOSPITAL_COMMUNITY): Payer: Self-pay | Admitting: Obstetrics and Gynecology

## 2023-12-24 ENCOUNTER — Other Ambulatory Visit: Payer: Self-pay

## 2023-12-24 ENCOUNTER — Inpatient Hospital Stay (HOSPITAL_COMMUNITY)
Admission: RE | Admit: 2023-12-24 | Discharge: 2023-12-26 | DRG: 743 | Disposition: A | Discharge status: Home / Self Care | Attending: Obstetrics and Gynecology | Admitting: Obstetrics and Gynecology

## 2023-12-24 ENCOUNTER — Encounter (HOSPITAL_COMMUNITY)
Admission: RE | Disposition: A | Discharge status: Home / Self Care | Payer: Self-pay | Source: Home / Self Care | Attending: Obstetrics and Gynecology

## 2023-12-24 DIAGNOSIS — D250 Submucous leiomyoma of uterus: Principal | ICD-10-CM | POA: Diagnosis present

## 2023-12-24 DIAGNOSIS — F411 Generalized anxiety disorder: Secondary | ICD-10-CM | POA: Diagnosis present

## 2023-12-24 DIAGNOSIS — Z9011 Acquired absence of right breast and nipple: Secondary | ICD-10-CM | POA: Diagnosis not present

## 2023-12-24 DIAGNOSIS — R9389 Abnormal findings on diagnostic imaging of other specified body structures: Secondary | ICD-10-CM | POA: Diagnosis present

## 2023-12-24 DIAGNOSIS — N920 Excessive and frequent menstruation with regular cycle: Secondary | ICD-10-CM | POA: Diagnosis present

## 2023-12-24 DIAGNOSIS — Z8249 Family history of ischemic heart disease and other diseases of the circulatory system: Secondary | ICD-10-CM | POA: Diagnosis not present

## 2023-12-24 DIAGNOSIS — Z01818 Encounter for other preprocedural examination: Principal | ICD-10-CM

## 2023-12-24 DIAGNOSIS — Z8616 Personal history of COVID-19: Secondary | ICD-10-CM | POA: Diagnosis not present

## 2023-12-24 DIAGNOSIS — D259 Leiomyoma of uterus, unspecified: Secondary | ICD-10-CM

## 2023-12-24 DIAGNOSIS — D509 Iron deficiency anemia, unspecified: Secondary | ICD-10-CM | POA: Diagnosis present

## 2023-12-24 DIAGNOSIS — Z90710 Acquired absence of both cervix and uterus: Secondary | ICD-10-CM | POA: Diagnosis present

## 2023-12-24 DIAGNOSIS — Z853 Personal history of malignant neoplasm of breast: Secondary | ICD-10-CM | POA: Diagnosis not present

## 2023-12-24 DIAGNOSIS — D219 Benign neoplasm of connective and other soft tissue, unspecified: Secondary | ICD-10-CM | POA: Diagnosis present

## 2023-12-24 DIAGNOSIS — Z833 Family history of diabetes mellitus: Secondary | ICD-10-CM | POA: Diagnosis not present

## 2023-12-24 HISTORY — DX: Abnormal findings on diagnostic imaging of other specified body structures: R93.89

## 2023-12-24 HISTORY — DX: Migraine with aura, not intractable, with status migrainosus: G43.101

## 2023-12-24 HISTORY — PX: HYSTERECTOMY ABDOMINAL WITH SALPINGECTOMY: SHX6725

## 2023-12-24 HISTORY — DX: Presence of spectacles and contact lenses: Z97.3

## 2023-12-24 HISTORY — DX: Other allergic rhinitis: J30.89

## 2023-12-24 HISTORY — DX: Presence of dental prosthetic device (complete) (partial): Z97.2

## 2023-12-24 HISTORY — DX: Iron deficiency anemia secondary to blood loss (chronic): D50.0

## 2023-12-24 HISTORY — DX: Major depressive disorder, single episode, unspecified: F32.9

## 2023-12-24 HISTORY — DX: Excessive and frequent menstruation with regular cycle: N92.0

## 2023-12-24 HISTORY — DX: Anemia, unspecified: D64.9

## 2023-12-24 HISTORY — DX: Leiomyoma of uterus, unspecified: D25.9

## 2023-12-24 HISTORY — DX: Generalized anxiety disorder: F41.1

## 2023-12-24 LAB — TYPE AND SCREEN
ABO/RH(D): O POS
Antibody Screen: NEGATIVE

## 2023-12-24 LAB — POCT PREGNANCY, URINE: Preg Test, Ur: NEGATIVE

## 2023-12-24 SURGERY — HYSTERECTOMY, TOTAL, ABDOMINAL, WITH SALPINGECTOMY
Anesthesia: General

## 2023-12-24 MED ORDER — ONDANSETRON HCL 4 MG PO TABS: 4.0000 mg | ORAL_TABLET | Freq: Four times a day (QID) | ORAL | Status: DC | PRN

## 2023-12-24 MED ORDER — PROPOFOL 10 MG/ML IV BOLUS
INTRAVENOUS | Status: DC | PRN
  Administered 2023-12-24: 50 mg via INTRAVENOUS
  Administered 2023-12-24: 150 mg via INTRAVENOUS

## 2023-12-24 MED ORDER — BUPIVACAINE HCL (PF) 0.25 % IJ SOLN
INTRAMUSCULAR | Status: AC
  Filled 2023-12-24: qty 30

## 2023-12-24 MED ORDER — IBUPROFEN 600 MG PO TABS
600.0000 mg | ORAL_TABLET | Freq: Four times a day (QID) | ORAL | Status: DC
Start: 2023-12-25 — End: 2023-12-26
  Administered 2023-12-25 – 2023-12-26 (×4): 600 mg via ORAL
  Filled 2023-12-24 (×4): qty 1

## 2023-12-24 MED ORDER — BUPIVACAINE LIPOSOME 1.3 % IJ SUSP
INTRAMUSCULAR | Status: AC
  Filled 2023-12-24: qty 20

## 2023-12-24 MED ORDER — STERILE WATER FOR IRRIGATION IR SOLN
Status: DC | PRN
  Administered 2023-12-24 (×2): 1000 mL

## 2023-12-24 MED ORDER — CHLORHEXIDINE GLUCONATE 0.12 % MT SOLN
15.0000 mL | Freq: Once | OROMUCOSAL | Status: AC
  Administered 2023-12-24: 15 mL via OROMUCOSAL

## 2023-12-24 MED ORDER — TRANEXAMIC ACID-NACL 1000-0.7 MG/100ML-% IV SOLN
1000.0000 mg | Freq: Once | INTRAVENOUS | Status: AC
  Administered 2023-12-24: 1000 mg via INTRAVENOUS

## 2023-12-24 MED ORDER — KETOROLAC TROMETHAMINE 30 MG/ML IJ SOLN
30.0000 mg | Freq: Four times a day (QID) | INTRAMUSCULAR | Status: AC
Start: 2023-12-24 — End: 2023-12-25
  Administered 2023-12-24 – 2023-12-25 (×4): 30 mg via INTRAVENOUS
  Filled 2023-12-24 (×4): qty 1

## 2023-12-24 MED ORDER — ROCURONIUM BROMIDE 10 MG/ML (PF) SYRINGE
PREFILLED_SYRINGE | INTRAVENOUS | Status: DC | PRN
  Administered 2023-12-24: 20 mg via INTRAVENOUS
  Administered 2023-12-24: 60 mg via INTRAVENOUS
  Administered 2023-12-24: 10 mg via INTRAVENOUS

## 2023-12-24 MED ORDER — ACETAMINOPHEN 500 MG PO TABS
1000.0000 mg | ORAL_TABLET | ORAL | Status: AC
  Administered 2023-12-24: 1000 mg via ORAL

## 2023-12-24 MED ORDER — ONDANSETRON HCL 4 MG/2ML IJ SOLN
INTRAMUSCULAR | Status: AC
  Filled 2023-12-24: qty 2

## 2023-12-24 MED ORDER — ORAL CARE MOUTH RINSE: 15.0000 mL | Freq: Once | OROMUCOSAL | Status: AC

## 2023-12-24 MED ORDER — AMISULPRIDE (ANTIEMETIC) 5 MG/2ML IV SOLN: 10.0000 mg | Freq: Once | INTRAVENOUS | Status: DC | PRN

## 2023-12-24 MED ORDER — ONDANSETRON HCL 4 MG/2ML IJ SOLN: 4.0000 mg | Freq: Once | INTRAMUSCULAR | Status: DC | PRN

## 2023-12-24 MED ORDER — DEXAMETHASONE SODIUM PHOSPHATE 10 MG/ML IJ SOLN
INTRAMUSCULAR | Status: AC
  Filled 2023-12-24: qty 1

## 2023-12-24 MED ORDER — ESCITALOPRAM OXALATE 10 MG PO TABS
10.0000 mg | ORAL_TABLET | Freq: Every day | ORAL | Status: DC
Start: 2023-12-24 — End: 2023-12-26
  Administered 2023-12-24 – 2023-12-26 (×3): 10 mg via ORAL
  Filled 2023-12-24 (×3): qty 1

## 2023-12-24 MED ORDER — GABAPENTIN 300 MG PO CAPS
ORAL_CAPSULE | ORAL | Status: AC
  Filled 2023-12-24: qty 1

## 2023-12-24 MED ORDER — ROCURONIUM BROMIDE 10 MG/ML (PF) SYRINGE
PREFILLED_SYRINGE | INTRAVENOUS | Status: AC
  Filled 2023-12-24: qty 10

## 2023-12-24 MED ORDER — DEXMEDETOMIDINE HCL IN NACL 80 MCG/20ML IV SOLN
INTRAVENOUS | Status: AC
  Filled 2023-12-24: qty 20

## 2023-12-24 MED ORDER — SODIUM CHLORIDE 0.9 % IV SOLN
INTRAVENOUS | Status: AC
  Filled 2023-12-24: qty 2

## 2023-12-24 MED ORDER — OXYCODONE HCL 5 MG/5ML PO SOLN: 5.0000 mg | Freq: Once | ORAL | Status: DC | PRN

## 2023-12-24 MED ORDER — MIDAZOLAM HCL 2 MG/2ML IJ SOLN
INTRAMUSCULAR | Status: DC | PRN
  Administered 2023-12-24: 2 mg via INTRAVENOUS

## 2023-12-24 MED ORDER — VASOPRESSIN 20 UNIT/ML IV SOLN
INTRAVENOUS | Status: DC | PRN
  Administered 2023-12-24: 50 mL via INTRAMUSCULAR

## 2023-12-24 MED ORDER — LACTATED RINGERS IV SOLN: INTRAVENOUS | Status: AC

## 2023-12-24 MED ORDER — ALUM & MAG HYDROXIDE-SIMETH 200-200-20 MG/5ML PO SUSP: 30.0000 mL | ORAL | Status: DC | PRN

## 2023-12-24 MED ORDER — KETOROLAC TROMETHAMINE 30 MG/ML IJ SOLN: 30.0000 mg | Freq: Once | INTRAMUSCULAR | Status: DC | PRN

## 2023-12-24 MED ORDER — HYDROMORPHONE HCL 1 MG/ML IJ SOLN
INTRAMUSCULAR | Status: DC | PRN
  Administered 2023-12-24: .5 mg via INTRAVENOUS

## 2023-12-24 MED ORDER — SODIUM CHLORIDE (PF) 0.9 % IJ SOLN
INTRAMUSCULAR | Status: AC
  Filled 2023-12-24: qty 100

## 2023-12-24 MED ORDER — FERRIC MALTOL 30 MG PO CAPS
30.0000 mg | ORAL_CAPSULE | Freq: Every day | ORAL | Status: DC
Start: 2023-12-24 — End: 2023-12-24
  Filled 2023-12-24: qty 1

## 2023-12-24 MED ORDER — MENTHOL 3 MG MT LOZG: 1.0000 | LOZENGE | OROMUCOSAL | Status: DC | PRN

## 2023-12-24 MED ORDER — HYDROMORPHONE HCL 1 MG/ML IJ SOLN
INTRAMUSCULAR | Status: AC
  Filled 2023-12-24: qty 0.5

## 2023-12-24 MED ORDER — SIMETHICONE 80 MG PO CHEW: 80.0000 mg | CHEWABLE_TABLET | Freq: Four times a day (QID) | ORAL | Status: DC | PRN

## 2023-12-24 MED ORDER — SCOPOLAMINE 1 MG/3DAYS TD PT72
MEDICATED_PATCH | TRANSDERMAL | Status: AC
  Filled 2023-12-24: qty 1

## 2023-12-24 MED ORDER — PANTOPRAZOLE SODIUM 40 MG PO TBEC
40.0000 mg | DELAYED_RELEASE_TABLET | Freq: Every day | ORAL | Status: DC
  Administered 2023-12-24 – 2023-12-26 (×3): 40 mg via ORAL
  Filled 2023-12-24 (×3): qty 1

## 2023-12-24 MED ORDER — DEXAMETHASONE SODIUM PHOSPHATE 10 MG/ML IJ SOLN
INTRAMUSCULAR | Status: DC | PRN
  Administered 2023-12-24: 10 mg via INTRAVENOUS

## 2023-12-24 MED ORDER — FENTANYL CITRATE (PF) 250 MCG/5ML IJ SOLN
INTRAMUSCULAR | Status: DC | PRN
  Administered 2023-12-24 (×2): 100 ug via INTRAVENOUS
  Administered 2023-12-24: 50 ug via INTRAVENOUS

## 2023-12-24 MED ORDER — OXYCODONE HCL 5 MG PO TABS: 5.0000 mg | ORAL_TABLET | Freq: Once | ORAL | Status: DC | PRN

## 2023-12-24 MED ORDER — SENNA 8.6 MG PO TABS
1.0000 | ORAL_TABLET | Freq: Two times a day (BID) | ORAL | Status: DC
  Administered 2023-12-24 – 2023-12-26 (×5): 8.6 mg via ORAL
  Filled 2023-12-24 (×5): qty 1

## 2023-12-24 MED ORDER — ACETAMINOPHEN 500 MG PO TABS
ORAL_TABLET | ORAL | Status: AC
  Filled 2023-12-24: qty 2

## 2023-12-24 MED ORDER — HYDROMORPHONE HCL 1 MG/ML IJ SOLN
0.2000 mg | INTRAMUSCULAR | Status: DC | PRN
  Administered 2023-12-24 (×2): 0.6 mg via INTRAVENOUS
  Filled 2023-12-24 (×2): qty 1

## 2023-12-24 MED ORDER — ALBUMIN HUMAN 5 % IV SOLN: INTRAVENOUS | Status: DC | PRN

## 2023-12-24 MED ORDER — FENTANYL CITRATE (PF) 250 MCG/5ML IJ SOLN
INTRAMUSCULAR | Status: AC
  Filled 2023-12-24: qty 5

## 2023-12-24 MED ORDER — POVIDONE-IODINE 10 % EX SWAB
2.0000 | Freq: Once | CUTANEOUS | Status: DC
Start: 2023-12-24 — End: 2023-12-24

## 2023-12-24 MED ORDER — LACTATED RINGERS IV SOLN: INTRAVENOUS | Status: DC

## 2023-12-24 MED ORDER — PHENYLEPHRINE HCL-NACL 20-0.9 MG/250ML-% IV SOLN
INTRAVENOUS | Status: DC | PRN
  Administered 2023-12-24: 20 ug/min via INTRAVENOUS

## 2023-12-24 MED ORDER — LIDOCAINE 2% (20 MG/ML) 5 ML SYRINGE
INTRAMUSCULAR | Status: DC | PRN
  Administered 2023-12-24: 80 mg via INTRAVENOUS

## 2023-12-24 MED ORDER — PHENYLEPHRINE 80 MCG/ML (10ML) SYRINGE FOR IV PUSH (FOR BLOOD PRESSURE SUPPORT)
PREFILLED_SYRINGE | INTRAVENOUS | Status: DC | PRN
  Administered 2023-12-24 (×2): 80 ug via INTRAVENOUS

## 2023-12-24 MED ORDER — MIDAZOLAM HCL 2 MG/2ML IJ SOLN
INTRAMUSCULAR | Status: AC
  Filled 2023-12-24: qty 2

## 2023-12-24 MED ORDER — DEXMEDETOMIDINE HCL IN NACL 80 MCG/20ML IV SOLN
INTRAVENOUS | Status: DC | PRN
  Administered 2023-12-24: 8 ug via INTRAVENOUS

## 2023-12-24 MED ORDER — SCOPOLAMINE 1 MG/3DAYS TD PT72
1.0000 | MEDICATED_PATCH | TRANSDERMAL | Status: DC
  Administered 2023-12-24: 1 mg via TRANSDERMAL

## 2023-12-24 MED ORDER — LIDOCAINE 2% (20 MG/ML) 5 ML SYRINGE
INTRAMUSCULAR | Status: AC
  Filled 2023-12-24: qty 5

## 2023-12-24 MED ORDER — VASOPRESSIN 20 UNIT/ML IV SOLN
INTRAVENOUS | Status: AC
  Filled 2023-12-24: qty 1

## 2023-12-24 MED ORDER — 0.9 % SODIUM CHLORIDE (POUR BTL) OPTIME
TOPICAL | Status: DC | PRN
  Administered 2023-12-24: 1000 mL

## 2023-12-24 MED ORDER — CHLORHEXIDINE GLUCONATE 0.12 % MT SOLN
OROMUCOSAL | Status: AC
  Filled 2023-12-24: qty 15

## 2023-12-24 MED ORDER — KETAMINE HCL 50 MG/5ML IJ SOSY
PREFILLED_SYRINGE | INTRAMUSCULAR | Status: AC
  Filled 2023-12-24: qty 5

## 2023-12-24 MED ORDER — KETAMINE HCL 50 MG/5ML IJ SOSY
PREFILLED_SYRINGE | INTRAMUSCULAR | Status: DC | PRN
  Administered 2023-12-24: 20 mg via INTRAVENOUS
  Administered 2023-12-24: 30 mg via INTRAVENOUS

## 2023-12-24 MED ORDER — HYDROMORPHONE HCL 1 MG/ML IJ SOLN: 0.2500 mg | INTRAMUSCULAR | Status: DC | PRN

## 2023-12-24 MED ORDER — HEMOSTATIC AGENTS (NO CHARGE) OPTIME
TOPICAL | Status: DC | PRN
  Administered 2023-12-24: 1 via TOPICAL

## 2023-12-24 MED ORDER — ACETAMINOPHEN 500 MG PO TABS
1000.0000 mg | ORAL_TABLET | Freq: Four times a day (QID) | ORAL | Status: DC
  Administered 2023-12-24 – 2023-12-26 (×8): 1000 mg via ORAL
  Filled 2023-12-24 (×9): qty 2

## 2023-12-24 MED ORDER — TRANEXAMIC ACID-NACL 1000-0.7 MG/100ML-% IV SOLN
INTRAVENOUS | Status: AC
  Filled 2023-12-24: qty 100

## 2023-12-24 MED ORDER — KETOROLAC TROMETHAMINE 30 MG/ML IJ SOLN
INTRAMUSCULAR | Status: DC | PRN
  Administered 2023-12-24: 30 mg via INTRAVENOUS

## 2023-12-24 MED ORDER — SODIUM CHLORIDE 0.9 % IV SOLN
2.0000 g | INTRAVENOUS | Status: AC
  Administered 2023-12-24: 2 g via INTRAVENOUS

## 2023-12-24 MED ORDER — MEPERIDINE HCL 25 MG/ML IJ SOLN
6.2500 mg | INTRAMUSCULAR | Status: DC | PRN
  Filled 2023-12-24: qty 1

## 2023-12-24 MED ORDER — BUPIVACAINE LIPOSOME 1.3 % IJ SUSP
INTRAMUSCULAR | Status: DC | PRN
  Administered 2023-12-24: 50 mL via SURGICAL_CAVITY

## 2023-12-24 MED ORDER — OXYCODONE HCL 5 MG PO TABS
5.0000 mg | ORAL_TABLET | ORAL | Status: DC | PRN
  Administered 2023-12-24 – 2023-12-25 (×4): 5 mg via ORAL
  Filled 2023-12-24 (×4): qty 1

## 2023-12-24 MED ORDER — SUGAMMADEX SODIUM 200 MG/2ML IV SOLN
INTRAVENOUS | Status: DC | PRN
  Administered 2023-12-24: 200 mg via INTRAVENOUS

## 2023-12-24 MED ORDER — PHENYLEPHRINE 80 MCG/ML (10ML) SYRINGE FOR IV PUSH (FOR BLOOD PRESSURE SUPPORT)
PREFILLED_SYRINGE | INTRAVENOUS | Status: AC
  Filled 2023-12-24: qty 10

## 2023-12-24 MED ORDER — KETOROLAC TROMETHAMINE 30 MG/ML IJ SOLN
INTRAMUSCULAR | Status: AC
  Filled 2023-12-24: qty 1

## 2023-12-24 MED ORDER — ONDANSETRON HCL 4 MG/2ML IJ SOLN: 4.0000 mg | Freq: Four times a day (QID) | INTRAMUSCULAR | Status: DC | PRN

## 2023-12-24 MED ORDER — PROPOFOL 10 MG/ML IV BOLUS
INTRAVENOUS | Status: AC
  Filled 2023-12-24: qty 20

## 2023-12-24 MED ORDER — GABAPENTIN 300 MG PO CAPS
300.0000 mg | ORAL_CAPSULE | ORAL | Status: AC
  Administered 2023-12-24: 300 mg via ORAL

## 2023-12-24 MED ORDER — ONDANSETRON HCL 4 MG/2ML IJ SOLN
INTRAMUSCULAR | Status: DC | PRN
  Administered 2023-12-24: 4 mg via INTRAVENOUS

## 2023-12-24 MED ORDER — BUSPIRONE HCL 10 MG PO TABS
10.0000 mg | ORAL_TABLET | Freq: Two times a day (BID) | ORAL | Status: DC
  Administered 2023-12-24 – 2023-12-26 (×5): 10 mg via ORAL
  Filled 2023-12-24 (×6): qty 1

## 2023-12-24 SURGICAL SUPPLY — 52 items
BAG COUNTER SPONGE SURGICOUNT (BAG) ×1 IMPLANT
BENZOIN TINCTURE PRP APPL 2/3 (GAUZE/BANDAGES/DRESSINGS) IMPLANT
BINDER ABDOMINAL 12 ML 46-62 (SOFTGOODS) IMPLANT
COVER MAYO STAND STRL (DRAPES) ×1 IMPLANT
DRAPE CESAREAN BIRTH W POUCH (DRAPES) ×2 IMPLANT
DRAPE WARM FLUID 44X44 (DRAPES) IMPLANT
DRSG OPSITE POSTOP 4X10 (GAUZE/BANDAGES/DRESSINGS) ×1 IMPLANT
DURAPREP 26ML APPLICATOR (WOUND CARE) ×1 IMPLANT
ELECT BLADE 6.5 EXT (BLADE) IMPLANT
GAUZE 4X4 16PLY ~~LOC~~+RFID DBL (SPONGE) ×1 IMPLANT
GAUZE PAD ABD 8X10 STRL (GAUZE/BANDAGES/DRESSINGS) IMPLANT
GLOVE BIOGEL M 7.0 STRL (GLOVE) ×1 IMPLANT
GLOVE BIOGEL PI IND STRL 7.0 (GLOVE) ×3 IMPLANT
GOWN STRL REUS W/ TWL LRG LVL3 (GOWN DISPOSABLE) ×3 IMPLANT
HIBICLENS CHG 4% 4OZ BTL (MISCELLANEOUS) ×2 IMPLANT
KIT TURNOVER KIT B (KITS) ×1 IMPLANT
LIGASURE IMPACT 36 18CM CVD LR (INSTRUMENTS) IMPLANT
MANIFOLD NEPTUNE II (INSTRUMENTS) ×1 IMPLANT
NDL HYPO 18GX1.5 BLUNT FILL (NEEDLE) IMPLANT
NDL HYPO 22X1.5 SAFETY MO (MISCELLANEOUS) IMPLANT
NEEDLE HYPO 18GX1.5 BLUNT FILL (NEEDLE) ×1 IMPLANT
NEEDLE HYPO 22X1.5 SAFETY MO (MISCELLANEOUS) ×1 IMPLANT
PACK ABDOMINAL GYN (CUSTOM PROCEDURE TRAY) ×1 IMPLANT
PAD ARMBOARD POSITIONER FOAM (MISCELLANEOUS) ×1 IMPLANT
PAD OB MATERNITY 11 LF (PERSONAL CARE ITEMS) ×1 IMPLANT
PENCIL SMOKE EVACUATOR (MISCELLANEOUS) ×1 IMPLANT
POWDER SURGICEL 3.0 GRAM (HEMOSTASIS) IMPLANT
RTRCTR C-SECT PINK 25CM LRG (MISCELLANEOUS) IMPLANT
SOLN 0.9% NACL 1000 ML (IV SOLUTION) ×2 IMPLANT
SOLN 0.9% NACL POUR BTL 1000ML (IV SOLUTION) ×1 IMPLANT
SOLN STERILE WATER 1000 ML (IV SOLUTION) ×1 IMPLANT
SOLN STERILE WATER BTL 1000 ML (IV SOLUTION) IMPLANT
SPECIMEN JAR MEDIUM (MISCELLANEOUS) ×1 IMPLANT
SPIKE FLUID TRANSFER (MISCELLANEOUS) IMPLANT
SPONGE T-LAP 18X18 ~~LOC~~+RFID (SPONGE) ×2 IMPLANT
SPONGE T-LAP 18X36 ~~LOC~~+RFID STR (SPONGE) IMPLANT
STAPLER SKIN PROX 35W (STAPLE) IMPLANT
STRIP CLOSURE SKIN 1/2X4 (GAUZE/BANDAGES/DRESSINGS) IMPLANT
STRIP SURGICAL 1/2 X 6 IN (GAUZE/BANDAGES/DRESSINGS) IMPLANT
SUT PDS AB 0 CT1 27 (SUTURE) ×2 IMPLANT
SUT VIC AB 0 CT1 27XCR 8 STRN (SUTURE) ×2 IMPLANT
SUT VIC AB 0 CT1 36 (SUTURE) ×1 IMPLANT
SUT VIC AB 2-0 CT1 (SUTURE) IMPLANT
SUT VIC AB 2-0 CT1 TAPERPNT 27 (SUTURE) ×1 IMPLANT
SUT VIC AB 2-0 SH 27XBRD (SUTURE) ×1 IMPLANT
SUT VIC AB 4-0 KS 27 (SUTURE) IMPLANT
SUT VICRYL 0 TIES 12 18 (SUTURE) ×1 IMPLANT
SYR 3ML LL SCALE MARK (SYRINGE) IMPLANT
SYR CONTROL 10ML LL (SYRINGE) IMPLANT
TAPE PAPER 3X10 WHT MICROPORE (GAUZE/BANDAGES/DRESSINGS) IMPLANT
TOWEL GREEN STERILE FF (TOWEL DISPOSABLE) ×2 IMPLANT
TRAY FOLEY W/BAG SLVR 14FR (SET/KITS/TRAYS/PACK) ×1 IMPLANT

## 2023-12-24 NOTE — Op Note (Signed)
 12/24/2023  11:00 AM  PATIENT:  Connie Underwood  48 y.o. female  PRE-OPERATIVE DIAGNOSIS:  Fibroids Menorrhagia with regular cycle Endometrial thickening on ultrasound  POST-OPERATIVE DIAGNOSIS:  Fibroids Menorrhagia with regular cycle Endometrial thickening on ultrasound  PROCEDURE:  Procedure(s): HYSTERECTOMY, TOTAL, ABDOMINAL, WITH BILATERAL SALPINGECTOMY (N/A)  SURGEON:  Surgeons and Role:    DEWAINE Rosalva Sawyer, MD - Primary    * Autry-Lott, Rojean, DO - Assisting  PHYSICIAN ASSISTANT:   ASSISTANTS: Autry-Lott, Simone DO An experienced assistant was required given the standard of surgical care given the complexity of the case.  This assistant was needed for exposure, dissection, suctioning, retraction, instrument exchange, assisting with delivery with administration of fundal pressure, and for overall help during the procedure.    ANESTHESIA:   general  EBL:  425 mL   BLOOD ADMINISTERED:none  DRAINS: none   LOCAL MEDICATIONS USED:  OTHER Exparel  with Marcaine    SPECIMEN:  Source of Specimen:  uterus, cervix and bilateral fallopian tubes   DISPOSITION OF SPECIMEN:  PATHOLOGY  COUNTS:  YES  TOURNIQUET:  * No tourniquets in log *  DICTATION: .Note written in EPIC  PLAN OF CARE: Admit to inpatient   PATIENT DISPOSITION:  PACU - hemodynamically stable.   Delay start of Pharmacological VTE agent (>24hrs) due to surgical blood loss or risk of bleeding: not applicable  Findings: Enlarged fibroid uterus, normal appearing bilateral fallopian tubes and ovaries    Procedure Details  The patient was seen in the Holding Room. The risks, benefits, complications, treatment options, and expected outcomes were discussed with the patient.  The patient concurred with the proposed plan, giving informed consent.  The site of surgery properly noted/marked. The patient was taken to Operating Room # 7 at [facility], identified as Connie Underwood and the procedure verified as  Total abdominal hysterectomy with bilateral salpingectomy . A Time Out was held and the above information confirmed.  After induction of anesthesia, the patient was draped and prepped in the usual sterile manner. Pt was placed in supine position after anesthesia and draped and prepped in the usual sterile manner. Foley catheter was placed.  A Pfannensteihl  incision was made and carried through the subcutaneous tissue to the fascia. Fascial incision was made and extended laterally . The rectus muscles were separated. The peritoneum was identified and entered. Peritoneal incision was extended longitudinally.  The above findings were noted. Alexis  retractor was placed and bowel was packed away from the surgical site.  Vasopressin  was injected into the fundal fibroid and a corkscrew retractor was placed.   The round ligaments were identified, cut, and ligated with 0-Vicryl. The anterior peritoneal reflection was incised and the bladder was dissected off the lower uterine segment.  The right utero-ovarian ligament and  fallopian tube were grasped with ligasure cauterized and transected. The left utero-ovarian ligament and  fallopian tube were grasped with the ligasure cauterized and transected.,. Hemostasis  was observed. The uterine vessels were skeletonized, then clamped with ligasure cauterized and transected.. Serial pedicles of the cardinal and utero-sacral ligaments were clamped, with ligasure cauterized and transected. Entrance was made into the vagina and the uterus removed. Vaginal cuff angle sutures were placed incorporating the utero-sacral ligaments for support. The vaginal cuff was then closed with a running stitch of 0- Vicryl. Lavage was carried out.  Oozing was noted from the vaginal cuff.  Surgicel powder was applied. After 3 mintunes lavage was carried out until clear. Hemostasis was observed.  Retractor  and all packing was removed from the abdomen. The fascia was approximated with running  sutures of 0-PDS. Lavage was again carried out. Hemostasis was observed.  Exparel  with marcaine  was injected along the fascia and subcutaneous layer. The subcutaneous layer was reapproximated with 2-0 vicryl. The skin was approximated with 4-0 vicryl.   Instrument, sponge, and needle counts were correct prior to abdominal closure and at the conclusion of the case.

## 2023-12-24 NOTE — Anesthesia Preprocedure Evaluation (Addendum)
 Anesthesia Evaluation  Patient identified by MRN, date of birth, ID band Patient awake    Reviewed: Allergy & Precautions, H&P , NPO status , Patient's Chart, lab work & pertinent test results  Airway Mallampati: II  TM Distance: >3 FB Neck ROM: Full    Dental  (+) Partial Upper, Teeth Intact, Dental Advisory Given   Pulmonary neg pulmonary ROS   Pulmonary exam normal breath sounds clear to auscultation       Cardiovascular negative cardio ROS Normal cardiovascular exam Rhythm:Regular Rate:Normal     Neuro/Psych  Headaches PSYCHIATRIC DISORDERS Anxiety Depression    [facility] 2024 w/ normal angio, self resolved    GI/Hepatic negative GI ROS, Neg liver ROS,,,  Endo/Other  BMI 32  Renal/GU negative Renal ROS  negative genitourinary   Musculoskeletal negative musculoskeletal ROS (+)    Abdominal   Peds negative pediatric ROS (+)  Hematology  (+) Blood dyscrasia, anemia Hb 9.7, plt 456   Anesthesia Other Findings Wegovy  LD: 9/12  Reproductive/Obstetrics negative OB ROS                              Anesthesia Physical Anesthesia Plan  ASA: 2  Anesthesia Plan: General   Post-op Pain Management: Tylenol  PO (pre-op)*, Toradol  IV (intra-op)*, Precedex , Ketamine  IV* and Dilaudid  IV   Induction: Intravenous  PONV Risk Score and Plan: 4 or greater and Ondansetron , Dexamethasone , Midazolam , Scopolamine  patch - Pre-op and Treatment may vary due to age or medical condition  Airway Management Planned: Oral ETT  Additional Equipment: None  Intra-op Plan:   Post-operative Plan: Extubation in OR  Informed Consent: I have reviewed the patients History and Physical, chart, labs and discussed the procedure including the risks, benefits and alternatives for the proposed anesthesia with the patient or authorized representative who has indicated his/her understanding and acceptance.     Dental advisory  given  Plan Discussed with: CRNA  Anesthesia Plan Comments:          Anesthesia Quick Evaluation

## 2023-12-24 NOTE — Transfer of Care (Signed)
 Immediate Anesthesia Transfer of Care Note  Patient: Connie Underwood  Procedure(s) Performed: HYSTERECTOMY, TOTAL, ABDOMINAL, WITH BILATERAL SALPINGECTOMY  Patient Location: PACU  Anesthesia Type:General  Level of Consciousness: drowsy, patient cooperative, and responds to stimulation  Airway & Oxygen Therapy: Patient Spontanous Breathing and Patient connected to face mask oxygen  Post-op Assessment: Report given to RN and Post -op Vital signs reviewed and stable  Post vital signs: Reviewed and stable  Last Vitals:  Vitals Value Taken Time  BP 129/86 11:12  Temp    Pulse 100 12/24/23 11:02  Resp 24 12/24/23 11:02  SpO2 100 % 12/24/23 11:02  Vitals shown include unfiled device data.  Last Pain:  Vitals:   12/24/23 0544  TempSrc: Oral  PainSc: 0-No pain      Patients Stated Pain Goal: 5 (12/24/23 0544)  Complications: No notable events documented.

## 2023-12-24 NOTE — Anesthesia Postprocedure Evaluation (Signed)
 Anesthesia Post Note  Patient: Connie Underwood  Procedure(s) Performed: HYSTERECTOMY, TOTAL, ABDOMINAL, WITH BILATERAL SALPINGECTOMY     Patient location during evaluation: PACU Anesthesia Type: General Level of consciousness: awake and alert, oriented and patient cooperative Pain management: pain level controlled Vital Signs Assessment: post-procedure vital signs reviewed and stable Respiratory status: spontaneous breathing, nonlabored ventilation and respiratory function stable Cardiovascular status: blood pressure returned to baseline and stable Postop Assessment: no apparent nausea or vomiting Anesthetic complications: no   No notable events documented.  Last Vitals:  Vitals:   12/24/23 1238 12/24/23 1300  BP: 123/73 135/74  Pulse: 81 84  Resp: 16 15  Temp: 36.7 C 36.7 C  SpO2: 98% 97%    Last Pain:  Vitals:   12/24/23 1300  TempSrc: Oral  PainSc:                  Connie Underwood

## 2023-12-24 NOTE — Anesthesia Procedure Notes (Signed)
 Procedure Name: Intubation Date/Time: 12/24/2023 7:43 AM  Performed by: Vergie Ike LABOR, CRNAPre-anesthesia Checklist: Patient identified, Emergency Drugs available, Suction available and Patient being monitored Patient Re-evaluated:Patient Re-evaluated prior to induction Oxygen Delivery Method: Circle System Utilized Preoxygenation: Pre-oxygenation with 100% oxygen Induction Type: IV induction Ventilation: Mask ventilation without difficulty Laryngoscope Size: Miller and 3 Grade View: Grade II Tube type: Oral Tube size: 7.0 mm Number of attempts: 1 Airway Equipment and Method: Stylet Placement Confirmation: ETT inserted through vocal cords under direct vision, positive ETCO2 and breath sounds checked- equal and bilateral Secured at: 21 cm Tube secured with: Tape Dental Injury: Teeth and Oropharynx as per pre-operative assessment

## 2023-12-24 NOTE — H&P (Signed)
 Subjective:    Chief Complaint(s): *Preop visit/ Heavy Menses and Fibroids    HPI:      General 48 y/o presents for pre-op visit. Pt is schedule for a total abdominal hysterectomy with bilateral salpingectomy on 12/24/2023 for the management of menorrhagia, fibroids, and endometrial thickening.  Her last iron  infusion was 12/03/2023. Her last menses started 12/05/2023. On her heaviest day she is changing a super tampon and an overnight pad every hour.   IN REVIEW:  History of fibroids, menorrhagia, thickened endometrium with previous negative EMB in 2024 Pt consulted with Dr. Storm in 2024 and options of hysterectomy were discussed. Pt did not proceed due to loss of her insurance. History of breast cancer 2022/ Currently on Rx Tamoxifen  Hgb 8.0 09/05/23 Last menses 09/06/23 and lasted 6-7 days. Gets iron  infusions from hematologist  GYN ULTRASOUND FINDINGS ON 09/24/2023:  Uterus 19.46x9.20x11.20 cm/ uterine volume 1049.59ml Endo; 318cm Anteverted uterus Enlarged uterus with multiple uterine fibroids 1) Ant LT 7.5x7.4x4.9cm 2)Fundal 53x43x37cm 3) Post 9.9x7.9x7.2cm 4)Post 5.1x4.9x3.7cm 5)Midbody submucosal 2.7x2.6x2.4cm 6)ANt 2.9x2.4x1.6cm 7)Fundal 6.2x5.9x5.0cm 8)Fundal rt 4.7x4.6x4.1cm All fibroids increased in size from previous us  05/29/22. Endometrium thickened and fluid filled with multiple hyperechoic masses within-mid cystic areas 5.0x4.5x2.1cm, Ant wall 2.0x2.0x1.2cm, Ant wall 2.1x1.3x1.1cm. All increased in size from previous us  05/29/22, some bf noted. Lt OV simple cyst 3.3x3.1x2.8cm, avascular. Rt OV not visualized. No adnexal masses seen.  EMB on 09/24/2023 was negative. --- She was scheduled for a hysterectomy last year but experienced a brain bleed during a concert in [place], which led to her hospitalization. She lost her insurance in May 2024 due to her inability to work but regained it at the end of September 2024. Her last Pap smear was inconclusive. She reports no family  history of ovarian cancer.  Her menstrual cycle is characterized by heavy bleeding, necessitating the use of a tampon and overnight pad every hour on the second day. Her periods typically last between 5 to 8 days, with the heaviest flow occurring on the second and third days. She also suffers from anemia.  She has a history of breast cancer, which was detected early and treated with surgery.  GYNECOLOGICAL HISTORY: - Last menstrual period: 09/06/2023 - Duration: 5-8 days - Frequency and flow: Heavy on the 2nd and 3rd days, requiring a tampon and overnight pad every hour.    Current Medication:     Taking Emgality (Galcanezumab -gnlm) 120 MG/ML Solution Prefilled Syringe 1 mL Subcutaneous monthly. SUMAtriptan  Succinate 50 MG Tablet 1 tablet at least 2 hours between doses as needed Oral Twice a day As needed. Concerta(Methylphenidate HCl ER (OSM)) 18 MG Tablet Extended Release 1 tablet in the morning Orally Once a day. Cyanocobalamin 1000 MCG Tablet 1 tablet Orally Once a day. Wegovy (Semaglutide -Weight Management) 2.4 MG/0.75ML Solution Auto-injector inject 2.4mg  Subcutaneous once a week. Vitamin D3 1.25 MG (50000 UT) Capsule 1 capsule Orally weekly. Tamoxifen  Citrate 20 MG Tablet 1 tablet Orally once a day. Lexapro (Escitalopram  Oxalate) 10 MG Tablet 1 tablet Orally Once a day. busPIRone  HCl 10 MG Tablet 1 tablet Orally Twice a day. Medication List reviewed and reconciled with the patient.    Medical History: Migriane HA Alllergic Rhinitis Anemia Anxiety & Depression (Dr. Cody Che - psych) Therapist (Ms. Seferino / [place]) COVID [phone]) breast cancer subarachnoid hemorrhage 07/02/2022 Allergies: seasonal fibroids blood transfusion- 10/2023    Allergies/Intolerance: Tomato (Diagnostic): Allergy - Hives    Gyn History: Sexual activity not currently sexually active.  Periods : irregular.  LMP 12/05/2023- currently.  Denies Birth control.  Last pap smear date 01/2022 NILM, HRHPV  neg.  Last mammogram date 09/12/23- normal per pt.  Denies Abnormal pap smear.  Denies STD.  Menarche 11.     OB History: Number of pregnancies  4.  Pregnancy # 1  girl, vaginal delivery.  Pregnancy # 2  girl, vaginal delivery.  Pregnancy # 3  boy, vaginal delivery.  Pregnancy # 4:  vaginal delivery, girl.     Surgical History: BTL right skin sparing mastectomy- Dr. Ebbie 11/2020 right breast reconstruction and left breast lift 03/2021    Hospitalization: Right Breast / Mastectomy 11/2020 subarachnoid hemorrhage 07/02/2022 not in past year 10/2023    Family History: Father: deceased 1 yrs, MVA - drowned Mother: alive 5 yrs, migriane HA, diabetes, HTN, Heart disease/stroke, hyperlipidemia, diagnosed with Hypertension, Coronary artery disease, Diabetes Paternal Grand Father: Prostate cancer Paternal Grand Mother: Diabetes, HTN, hyperlipidemia Maternal Grand Father: Prostate cancer Maternal Grand Mother: Diabetes, HTN, hyperlipidemia Brother 1: alive 52 yrs, corneal dystropy Brother2: alive 71 yrs Brother 3: alive 42 yrs, hereditary blindness Daughter(s): alive, 3 daughter all healthy Son(s): alive, healthy 3 brother(s) - healthy. 1 son(s) , 3 daughter(s) .    Social History:      General Tobacco use: cigarettes: Never smoked, Tobacco history last updated 12/08/2023, Vaping No.  EXPOSURE TO PASSIVE SMOKE: no.   Alcohol: no no. Caffeine: yes soda, rare. Recreational drug use: no no. DIET: lacking regular, drinks water . Exercise: yes walks 30 min. per day, 3 x per week. DENTAL CARE: good.   Marital Status: single.   Children: Boys, 1, girls, 3. EDUCATION: Some College. 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 COMMUNICATION BARRIERS: none.      ROS:      CONSTITUTIONAL Chills No. Fatigue No. Fever No. Night sweats No. Recent travel outside [place]  No. Sweats No. Weight change No.       OPHTHALMOLOGY Blurring of vision no. Change in vision no. Double vision no.        ENT Dizziness no. Nose bleeds no. Sore throat no. Teeth pain no.       ALLERGY Hives no.       CARDIOLOGY Chest pain no. High blood pressure no. Irregular heart beat no. Leg edema no. Palpitations no.       RESPIRATORY Shortness of breath no. Cough no. Wheezing no.       UROLOGY Pain with urination no. Urinary urgency no. Urinary frequency no. Urinary incontinence no. Difficulty urinating No. Blood in urine No.       GASTROENTEROLOGY Abdominal pain no. Appetite change no. Bloating/belching no. Blood in stool or on toilet paper no. Change in bowel movements no. Constipation no. Diarrhea no. Difficulty swallowing no. Nausea no.       FEMALE REPRODUCTIVE Vulvar pain no. Vulvar rash no. Abnormal vaginal bleeding , heavy menses. Breast pain no. Nipple discharge no. Pain with intercourse no. Pelvic pain no. Unusual vaginal discharge no. Vaginal itching no.       MUSCULOSKELETAL Muscle aches no.       NEUROLOGY Headache no. Tingling/numbness no. Weakness no.       PSYCHOLOGY Depression no. Anxiety no. Nervousness no. Sleep disturbances no. Suicidal ideation no .       ENDOCRINOLOGY Excessive thirst no. Excessive urination no. Hair loss no. Heat or cold intolerance no.       HEMATOLOGY/LYMPH Abnormal bleeding no. Easy bruising no. Swollen glands no.       DERMATOLOGY New/changing skin lesion no.  Rash no. Sores no.  Negative except as stated in HPI.   Objective:    Vitals: Wt: 183.8, Wt change: 0.8 lbs, Ht: 65, BMI: 30.58, Pulse sitting: 92, BP sitting: 113/77.    Past Results:    Examination:      General Examination CONSTITUTIONAL: alert, oriented, NAD.  SKIN:  moist, warm.  EYES:  Conjunctiva clear.  LUNGS: good I:E efffort noted, clear to auscultation bilaterally.  HEART: regular rate and rhythm.  ABDOMEN: soft, non-tender/non-distended, bowel sounds present.... large fibroid uterus palpated up to the umbilicus. nontender.  FEMALE GENITOURINARY: normal external genitalia,  labia - unremarkable, vagina - pink moist mucosa, no lesions or abnormal discharge, cervix - no discharge or lesions or CMT, adnexa - no masses or tenderness, uterus - nontender and 20 week size size on palpation.  EXTREMITIES: no edema present.  PSYCH:  affect normal, good eye contact.     Physical Examination:      Chaperone present Chaperone present Plata, Menda 12/08/2023 09:59:30 AM >, for pelvic exam.    Assessment:    Assessment: Fibroids - D21.9 (Primary)    Menorrhagia with regular cycle - N92.0    Anemia - D64.9    Generalized anxiety disorder - F41.1      Plan:    Treatment:     Fibroids     Notes: given the large size of her uterus we are planning a total abdominal hysterectomy with bilateral salpingectomy. r/b/a of surgery were discussed with the patient including but not limited to infection, bleeding, damage to bowel bladder uterus with the need for further surgery. r/o transfusion discussed with associated risk of HIV/ hepatitis B&C. Pt is advised she will stay in the hospital for 2 night. she will not be able to drive for 2 week. she will not be able to have sex or lift anything over 10 lbs for 6 weeks. she voiced understanding and deisre to proceed with total abdominal hysterectomy with bilateral salpingectomy for the management of fibroids and heavy menses. she is adivsed to avoid eating after midnight the night prior to surgery . avoid NSAIDS 14 days prior to surgery .     Menorrhagia with regular cycle Start Tranexamic Acid  Tablet, 650 MG, 2 tablets, Orally, 3 times a day, 5 days, 30 Tablet, Refills 0  Lab:CBC without Diff (Collection Date & Time - 12/08/2023 02:59 PM)      Notes: given the large size of her uterus we are planning a total abdominal hysterectomy with bilateral salpingectomy. r/b/a of surgery were discussed with the patient including but not limited to infection, bleeding, damage to bowel bladder uterus with the need for further surgery. r/o transfusion  discussed with associated risk of HIV/ hepatitis B&C. Pt is advised she will stay in the hospital for 2 night. she will not be able to drive for 2 week. she will not be able to have sex or lift anything over 10 lbs for 6 weeks. she voiced understanding and deisre to proceed with total abdominal hysterectomy with bilateral salpingectomy for the management of fibroids and heavy menses. she is adivsed to avoid eating after midnight the night prior to surgery . avoid NSAIDS 14 days prior to surgery .     Anemia  Lab:CBC without Diff (Collection Date & Time - 12/08/2023 02:59 PM)      Notes: given the large size of her uterus we are planning a total abdominal hysterectomy with bilateral salpingectomy. r/b/a of surgery were discussed with the patient including but  not limited to infection, bleeding, damage to bowel bladder uterus with the need for further surgery. r/o transfusion discussed with associated risk of HIV/ hepatitis B&C. Pt is advised she will stay in the hospital for 2 night. she will not be able to drive for 2 week. she will not be able to have sex or lift anything over 10 lbs for 6 weeks. she voiced understanding and deisre to proceed with total abdominal hysterectomy with bilateral salpingectomy for the management of fibroids and heavy menses. she is adivsed to avoid eating after midnight the night prior to surgery . avoid NSAIDS 14 days prior to surgery .     Others Refill Lexapro (Escitalopram  Oxalate) Tablet, 10 MG, 1 tablet, Orally, Once a day, 90 days, 90 Tablet, Refills 3 Refill busPIRone  HCl Tablet, 10 MG, 1 tablet, Orally, Twice a day, 90 days, 180 Tablet, Refills 1

## 2023-12-25 ENCOUNTER — Encounter (HOSPITAL_COMMUNITY): Payer: Self-pay | Admitting: Obstetrics and Gynecology

## 2023-12-25 LAB — CBC
HCT: 27 % — ABNORMAL LOW (ref 36.0–46.0)
Hemoglobin: 8.4 g/dL — ABNORMAL LOW (ref 12.0–15.0)
MCH: 25.3 pg — ABNORMAL LOW (ref 26.0–34.0)
MCHC: 31.1 g/dL (ref 30.0–36.0)
MCV: 81.3 fL (ref 80.0–100.0)
Platelets: 396 K/uL (ref 150–400)
RBC: 3.32 MIL/uL — ABNORMAL LOW (ref 3.87–5.11)
RDW: 23.5 % — ABNORMAL HIGH (ref 11.5–15.5)
WBC: 11 K/uL — ABNORMAL HIGH (ref 4.0–10.5)
nRBC: 0 % (ref 0.0–0.2)

## 2023-12-25 NOTE — Progress Notes (Signed)
 1 Day Post-Op Procedure(s) (LRB): HYSTERECTOMY, TOTAL, ABDOMINAL, WITH BILATERAL SALPINGECTOMY (N/A)  Subjective: Patient reports tolerating PO, + flatus, and no problems voiding.    Objective: I have reviewed patient's vital signs, intake and output, medications, and labs.  General: alert, cooperative, and no distress Resp: no distress GI: soft appropriately tender nondistened. No rebound no guarding .SABRA Bandage is clean dry and intact  Extremities: extremities normal, atraumatic, no cyanosis or edema  Assessment: s/p Procedure(s): HYSTERECTOMY, TOTAL, ABDOMINAL, WITH BILATERAL SALPINGECTOMY (N/A): stable, progressing well, and tolerating diet  Plan: Encourage ambulation Discontinue IV fluids Discharge home in am   LOS: 1 day    Rexene Hoit, MD 12/25/2023, 10:09 PM

## 2023-12-26 MED ORDER — ACETAMINOPHEN 500 MG PO TABS: 1000.0000 mg | ORAL_TABLET | Freq: Three times a day (TID) | ORAL | 0 refills | Status: AC | PRN

## 2023-12-26 MED ORDER — IBUPROFEN 600 MG PO TABS: 600.0000 mg | ORAL_TABLET | Freq: Four times a day (QID) | ORAL | 1 refills | Status: AC | PRN

## 2023-12-26 MED ORDER — OXYCODONE HCL 5 MG PO TABS: 5.0000 mg | ORAL_TABLET | Freq: Four times a day (QID) | ORAL | 0 refills | Status: AC | PRN

## 2023-12-26 NOTE — Discharge Summary (Signed)
 Physician Discharge Summary  Patient ID: Connie Underwood MRN: [id number] DOB/AGE: 48-Feb-1977 48 y.o.  Admit date: 12/24/2023 Discharge date: 12/26/2023  Admission Diagnoses:1) Fibroids 2) abnormal uterine bleeding   Discharge Diagnoses:  Principal Problem:   Fibroids Active Problems:   S/P TAH (total abdominal hysterectomy)   Discharged Condition: stable  Hospital Course: pt was admitted after undergoing a total abdominal hysterectomy with bilateral salpingectomy. She did well postoperatively with return of bowel and bladder function. She is discharge home on pod #2. She is ambulating, tolerating po and her pain is well controlled.   Consults: None  Significant Diagnostic Studies: labs:  Recent Results (from the past 2160 hours)  Sample to Blood Bank     Status: None   Collection Time: 11/04/23  2:12 PM  Result Value Ref Range   Blood Bank Specimen SAMPLE AVAILABLE FOR TESTING    Sample Expiration      11/07/2023,2359 Performed at [facility], [address] [place], [place] [address]   Type and screen     Status: None   Collection Time: 11/04/23  2:12 PM  Result Value Ref Range   ABO/RH(D) O POS    Antibody Screen NEG    Sample Expiration 11/07/2023,2359    Unit Number [id number]    Blood Component Type RED CELLS,LR    Unit division 00    Status of Unit ISSUED,FINAL    Transfusion Status OK TO TRANSFUSE    Crossmatch Result      Compatible Performed at [facility], [address], [place], [place] [address]    Unit Number [id number]    Blood Component Type RED CELLS,LR    Unit division 00    Status of Unit ISSUED,FINAL    Transfusion Status OK TO TRANSFUSE    Crossmatch Result Compatible   Prepare RBC (crossmatch)     Status: None   Collection Time: 11/04/23  2:12 PM  Result Value Ref Range   Order Confirmation      ORDER PROCESSED BY BLOOD BANK Performed at [facility], [address] [place], [place] [address]   BPAM RBC     Status: None   Collection Time: 11/04/23  2:12 PM  Result Value Ref Range   ISSUE DATE / TIME 797491938996    Blood Product Unit Number [id number]    PRODUCT CODE [id number]    Unit Type and Rh 5100    Blood Product Expiration Date 797490967640    ISSUE DATE / TIME 797491938996    Blood Product Unit Number [id number]    PRODUCT CODE [id number]    Unit Type and Rh 5100    Blood Product Expiration Date 797490937640   Iron  and Iron  Binding Capacity ([facility] only)     Status: Abnormal   Collection Time: 11/04/23  2:13 PM  Result Value Ref Range   Iron  9 (L) 28 - 170 ug/dL   TIBC 576 749 - 549 ug/dL   Saturation Ratios 2 (L) 10.4 - 31.8 %   UIBC 414 148 - 442 ug/dL    Comment: Performed at [facility], [address] [place], [place] [address]  CBC with Differential (Cancer Center Only)     Status: Abnormal   Collection Time: 11/04/23  2:13 PM  Result Value Ref Range   WBC Count 7.9 4.0 - 10.5 K/uL   RBC 3.12 (L) 3.87 - 5.11 MIL/uL   Hemoglobin 5.7 (LL) 12.0 - 15.0 g/dL  Comment: REPEATED TO VERIFY Reticulocyte Hemoglobin testing may be clinically indicated, consider ordering this additional test [id number] This result has been called to Anders Scurry by Jeoffrey Abbot on 11/04/2023 14:37:35, and has been read back.    HCT 20.4 (L) 36.0 - 46.0 %   MCV 65.4 (L) 80.0 - 100.0 fL   MCH 18.3 (L) 26.0 - 34.0 pg   MCHC 27.9 (L) 30.0 - 36.0 g/dL   RDW 80.2 (H) 88.4 - 84.4 %   Platelet Count 417 (H) 150 - 400 K/uL   nRBC 0.0 0.0 - 0.2 %   Neutrophils Relative % 65 %   Neutro Abs 5.2 1.7 - 7.7 K/uL   Lymphocytes Relative 23 %   Lymphs Abs 1.8 0.7 - 4.0 K/uL   Monocytes Relative 7 %   Monocytes Absolute 0.5 0.1 - 1.0 K/uL   Eosinophils Relative 4 %   Eosinophils Absolute 0.4 0.0 - 0.5 K/uL   Basophils Relative 1 %   Basophils Absolute 0.0 0.0 - 0.1 K/uL   Immature Granulocytes 0 %   Abs Immature  Granulocytes 0.01 0.00 - 0.07 K/uL    Comment: Performed at [facility], [address], [place], [place] [address]  Comprehensive metabolic panel     Status: Abnormal   Collection Time: 11/04/23  2:13 PM  Result Value Ref Range   Sodium 141 135 - 145 mmol/L   Potassium 3.3 (L) 3.5 - 5.1 mmol/L   Chloride 112 (H) 98 - 111 mmol/L   CO2 25 22 - 32 mmol/L   Glucose, Bld 95 70 - 99 mg/dL    Comment: Glucose reference range applies only to samples taken after fasting for at least 8 hours.   BUN 9 6 - 20 mg/dL   Creatinine, Ser 9.21 0.44 - 1.00 mg/dL   Calcium 8.3 (L) 8.9 - 10.3 mg/dL   Total Protein 6.5 6.5 - 8.1 g/dL   Albumin  3.8 3.5 - 5.0 g/dL   AST 11 (L) 15 - 41 U/L   ALT 5 0 - 44 U/L   Alkaline Phosphatase 38 38 - 126 U/L   Total Bilirubin 0.4 0.0 - 1.2 mg/dL   GFR, Estimated >39 >39 mL/min    Comment: (NOTE) Calculated using the CKD-EPI Creatinine Equation (2021)    Anion gap 4 (L) 5 - 15    Comment: Performed at [facility], [address], [place], [place] [address]  Ferritin     Status: Abnormal   Collection Time: 11/04/23  2:14 PM  Result Value Ref Range   Ferritin 3 (L) 11 - 307 ng/mL    Comment: Performed at [facility], [address], [place], [place] [address]  CBC with Differential (Cancer Center Only)     Status: Abnormal   Collection Time: 12/02/23  7:53 AM  Result Value Ref Range   WBC Count 6.5 4.0 - 10.5 K/uL   RBC 4.49 3.87 - 5.11 MIL/uL   Hemoglobin 10.6 (L) 12.0 - 15.0 g/dL    Comment: Reticulocyte Hemoglobin testing may be clinically indicated, consider ordering this additional test [id number]    HCT 34.7 (L) 36.0 - 46.0 %   MCV 77.3 (L) 80.0 - 100.0 fL   MCH 23.6 (L) 26.0 - 34.0 pg   MCHC 30.5 30.0 - 36.0 g/dL   RDW 72.0 (H) 88.4 - 84.4 %   Platelet Count 343 150 - 400 K/uL   nRBC 0.0 0.0 - 0.2 %  Neutrophils Relative % 72 %   Neutro Abs 4.7 1.7 - 7.7 K/uL   Lymphocytes  Relative 18 %   Lymphs Abs 1.2 0.7 - 4.0 K/uL   Monocytes Relative 6 %   Monocytes Absolute 0.4 0.1 - 1.0 K/uL   Eosinophils Relative 3 %   Eosinophils Absolute 0.2 0.0 - 0.5 K/uL   Basophils Relative 1 %   Basophils Absolute 0.0 0.0 - 0.1 K/uL   Immature Granulocytes 0 %   Abs Immature Granulocytes 0.01 0.00 - 0.07 K/uL    Comment: Performed at [facility], [address], [place], [place] [address]  Ferritin     Status: None   Collection Time: 12/02/23  7:53 AM  Result Value Ref Range   Ferritin 198 11 - 307 ng/mL    Comment: Performed at [facility], [address], [place], [place] [address]  Comprehensive metabolic panel     Status: Abnormal   Collection Time: 12/02/23  7:53 AM  Result Value Ref Range   Sodium 139 135 - 145 mmol/L   Potassium 3.9 3.5 - 5.1 mmol/L   Chloride 110 98 - 111 mmol/L   CO2 22 22 - 32 mmol/L   Glucose, Bld 87 70 - 99 mg/dL    Comment: Glucose reference range applies only to samples taken after fasting for at least 8 hours.   BUN 7 6 - 20 mg/dL   Creatinine, Ser 9.30 0.44 - 1.00 mg/dL   Calcium 8.6 (L) 8.9 - 10.3 mg/dL   Total Protein 6.6 6.5 - 8.1 g/dL   Albumin  3.8 3.5 - 5.0 g/dL   AST 11 (L) 15 - 41 U/L   ALT 5 0 - 44 U/L   Alkaline Phosphatase 35 (L) 38 - 126 U/L   Total Bilirubin 0.9 0.0 - 1.2 mg/dL   GFR, Estimated >39 >39 mL/min    Comment: (NOTE) Calculated using the CKD-EPI Creatinine Equation (2021)    Anion gap 7 5 - 15    Comment: Performed at [facility], [address], [place], [place] [address]  Sample to Blood Bank     Status: None   Collection Time: 12/02/23  7:54 AM  Result Value Ref Range   Blood Bank Specimen SAMPLE AVAILABLE FOR TESTING    Sample Expiration      12/05/2023,2359 Performed at [facility], [address] [place], [place] [address]   CBC per protocol     Status: Abnormal   Collection Time: 12/22/23 10:13 AM   Result Value Ref Range   WBC 6.1 4.0 - 10.5 K/uL   RBC 3.80 (L) 3.87 - 5.11 MIL/uL   Hemoglobin 9.7 (L) 12.0 - 15.0 g/dL   HCT 67.8 (L) 63.9 - 53.9 %   MCV 84.5 80.0 - 100.0 fL   MCH 25.5 (L) 26.0 - 34.0 pg   MCHC 30.2 30.0 - 36.0 g/dL   RDW 75.0 (H) 88.4 - 84.4 %   Platelets 456 (H) 150 - 400 K/uL   nRBC 0.0 0.0 - 0.2 %    Comment: Performed at [facility], [address], [place], [place] [address]  Pregnancy, urine POC     Status: None   Collection Time: 12/24/23  5:46 AM  Result Value Ref Range   Preg Test, Ur NEGATIVE NEGATIVE    Comment:        THE SENSITIVITY OF THIS METHODOLOGY IS >20 mIU/mL.   Type and screen  [facility]     Status: None   Collection Time: 12/24/23  6:15 AM  Result Value Ref Range   ABO/RH(D) O POS    Antibody Screen NEG    Sample Expiration      12/27/2023,2359 Performed at [facility], [address] [place], [place] [address]   Surgical pathology     Status: None   Collection Time: 12/24/23  9:30 AM  Result Value Ref Range   SURGICAL PATHOLOGY      SURGICAL PATHOLOGY CASE: [id number] PATIENT: [facility] Surgical Pathology Report     Clinical History: fibroids, menorrhagia with regular cycle, endometrial thickening on ultrasound (cm)     FINAL MICROSCOPIC DIAGNOSIS:  A. UTERUS AND CERVIX, WITH BILATERAL FALLOPIAN TUBES, HYSTERECTOMY: - Uterus with leiomyomata, largest measuring 4.5 cm - Benign secretory endometrium - Benign endometrial polyps, up to 3.2 cm - Focal adenomyosis - Benign unremarkable cervix - Benign unremarkable bilateral fallopian tubes - No evidence of malignancy     GROSS DESCRIPTION:  Specimen: Uterus with cervix and bilateral fallopian tubes received fresh Specimen integrity: Received with the cervix previously amputated Size and shape: 19 x 17 x 11.0 cm in aggregate and distorted by multiple subserosal and intramural nodules Weight: 1144 g Serosa: Tan-pink  and focally disrupted near 1 large subserosal nodule (disruption measuring 5.0 x 1.5 cm).  There are 3 subseros al nodules identified measuring up to 8.6 cm in greatest dimension. Cervix: 4.0 x 4.0 cm with a 1.9 cm in diameter slit-like patent os Endometrium: The endometrium is 7.0 x 4.5 cm and remarkable for 3 polyps ranging from 1.0 x 0.6 x 0.5 cm to 3.2 x 2.6 x 1.5 cm.  Sectioning reveals a 0.3 cm thick endometrium with no additional definitive abnormalities. Myometrium: The myometrium is up to 7.0 cm thick and remarkable for 4 tan-white well-circumscribed intramural nodules measuring up to 4.5 cm in greatest dimension.  The subserosal and intramural nodules all have a tan-white whorled cut surface without hemorrhage or necrosis. The attached fallopian tubes are discontinuous and cannot be definitively oriented. First fallopian tube: 3.0 cm in length by 0.4 cm in diameter with a smooth tan-pink serosal surface.  The fallopian tube is serially sectioned and appears grossly unremarkable. Second fallopian tube: 3.7 cm in length by 0.4 cm in diameter with a smooth tan-pink serosa l surface.  The fallopian tube is serially sectioned and appears grossly unremarkable. Block Summary: A1, anterior and posterior cervix transition zone.  A2, anterior and posterior endometrium.  A3, anterior and posterior serosa.  A4, 1 endometrial polyp.  A5-7, 1 serially section endometrial polyp.  A 8-14, 1 serially sectioned endometrial polyp.  A15, subserosal and intramural nodules.  A16, first fallopian tube.  A17, second fallopian tube. (WC 12/25/2023)   Final Diagnosis performed by Katrine Muskrat, MD.   Electronically signed 12/29/2023 Technical component performed at [facility], [address] [place], [place] [address].  Professional component performed at [facility], [address], [place], [place] [address].  Immunohistochemistry Technical component (if  applicable) was performed at [facility]. [address], [place], [place] [address].   IMMUNOHISTOCHEMISTRY DISCLAIMER (if applicable):  Some of these immunohistochemical stains may have been developed and the performance characteristics determine by [facility]. Some may not have been cleared or approved by the U.S. Food and Drug Administration. The FDA has determined that such clearance or approval is not necessary. This test is  used for clinical purposes. It should not be regarded as investigational or for research. This laboratory is certified under the Clinical Laboratory Improvement Amendments of 1988 (CLIA-88) as qualified to perform high complexity clinical laboratory testing.  The controls stained appropriately.   IHC stains are performed on formalin fixed, paraffin embedded tissue using a 3,3diaminobenzidine (DAB) chromogen and Leica Bond Autostainer System. The staining intensity of the nucleus is score manually and is reported as the percentage of tumor cell nuclei demonstrating specific nuclear staining. The specimens are fixed in 10% Neutral Formalin for at least 6 hours and u p to 72hrs. These tests are validated on decalcified tissue. Results should be interpreted with caution given the possibility of false negative results on decalcified specimens. Antibody Clones are as follows ER-clone 40F, PR-clone 16, Ki67- clone MM1. Some of these immunohistochemical stains may have been developed and the performance characteristics determined by [facility].   CBC     Status: Abnormal   Collection Time: 12/25/23  4:22 AM  Result Value Ref Range   WBC 11.0 (H) 4.0 - 10.5 K/uL   RBC 3.32 (L) 3.87 - 5.11 MIL/uL   Hemoglobin 8.4 (L) 12.0 - 15.0 g/dL   HCT 72.9 (L) 63.9 - 53.9 %   MCV 81.3 80.0 - 100.0 fL   MCH 25.3 (L) 26.0 - 34.0 pg   MCHC 31.1 30.0 - 36.0 g/dL   RDW 76.4 (H) 88.4 - 84.4 %   Platelets 396 150 - 400 K/uL   nRBC  0.0 0.0 - 0.2 %    Comment: Performed at [facility], [address] [place], [place] [address]      Treatments: surgery: total abdominal hysterectomy with bilateral salpingectomy    Discharge Exam: Blood pressure 113/65, pulse 66, temperature 98.4 F (36.9 C), temperature source Oral, resp. rate 16, height 5' 4 (1.626 m), weight 84.8 kg, last menstrual period 12/05/2023, SpO2 99%. General appearance: alert, cooperative, and no distress Resp: no distress  GI: soft appropriately tender nondisteded + BS in all 4 quadrants  Extremities: extremities normal, atraumatic, no cyanosis or edema Incision/Wound:.. Well approximated no erythema or exudate   Disposition: Discharge disposition: 01-Home or Self Care       Discharge Instructions      Remove dressing in 72 hours   Complete by: As directed    Call MD for:  persistant nausea and vomiting   Complete by: As directed    Call MD for:  redness, tenderness, or signs of infection (pain, swelling, redness, odor or green/yellow discharge around incision site)   Complete by: As directed    Call MD for:  severe uncontrolled pain   Complete by: As directed    Call MD for:  temperature >100.4   Complete by: As directed    Diet general   Complete by: As directed    Driving Restrictions   Complete by: As directed    Avoid driving for 2 weeks   Increase activity slowly   Complete by: As directed    Lifting restrictions   Complete by: As directed    Avoid lifting over 10 lbs for 6 weeks and until approved by Dr. Rosalva   May shower / Bathe   Complete by: As directed    May walk up steps   Complete by: As directed    Sexual Activity Restrictions   Complete by: As directed    Avoid sex for 6 weeks and until approved by Dr. Rosalva  Allergies as of 12/26/2023       Reactions   Tomato Hives        Medication List     TAKE these medications    ACCRUFeR  30 MG Caps Generic drug: Ferric Maltol  TAKE 1 CAPSULE (30 MG  TOTAL) BY MOUTH DAILY AT 2 PM.   acetaminophen  500 MG tablet Commonly known as: TYLENOL  Take 2 tablets (1,000 mg total) by mouth every 8 (eight) hours as needed for mild pain (pain score 1-3).   busPIRone  10 MG tablet Commonly known as: BUSPAR  Take 10 mg by mouth 2 (two) times daily. Pt stated prescribed BID however takes am dose and pm dose as needed   cyanocobalamin 1000 MCG tablet Commonly known as: VITAMIN B12 Take 1,000 mcg by mouth daily.   Emgality  120 MG/ML Soaj Generic drug: Galcanezumab -gnlm Inject 1 mL into the skin every 30 (thirty) days.   escitalopram  10 MG tablet Commonly known as: LEXAPRO  Take 10 mg by mouth daily.   ibuprofen  600 MG tablet Commonly known as: ADVIL  Take 1 tablet (600 mg total) by mouth every 6 (six) hours as needed for mild pain (pain score 1-3).   oxyCODONE  5 MG immediate release tablet Commonly known as: Oxy IR/ROXICODONE  Take 1-2 tablets (5-10 mg total) by mouth every 6 (six) hours as needed for moderate pain (pain score 4-6).   SUMAtriptan  50 MG tablet Commonly known as: IMITREX  Take 50 mg by mouth every 2 (two) hours as needed for migraine.   tamoxifen  20 MG tablet Commonly known as: NOLVADEX  TAKE 1 TABLET BY MOUTH EVERY DAY   topiramate 25 MG tablet Commonly known as: TOPAMAX Take 25 mg by mouth 2 (two) times daily.   Vitamin D  (Ergocalciferol ) 1.25 MG (50000 UNIT) Caps capsule Commonly known as: DRISDOL  Take 50,000 Units by mouth once a week. Monday's   Wegovy  2.4 MG/0.75ML Soaj SQ injection Generic drug: semaglutide -weight management Inject 2.4 mg into the skin once a week. What changed: Another medication with the same name was changed. Make sure you understand how and when to take each.   Wegovy  2.4 MG/0.75ML Soaj SQ injection Generic drug: semaglutide -weight management Inject 2.4 mg into the skin once a week. What changed: additional instructions        Follow-up Information     Rosalva Sawyer, MD. Go in 2 week(s).    Specialty: Obstetrics and Gynecology Contact information: [address] [place] [address] [phone]                 Signed: Sawyer Rosalva 12/26/2023, 1:03 PM

## 2023-12-30 LAB — SURGICAL PATHOLOGY

## 2024-01-06 ENCOUNTER — Other Ambulatory Visit (HOSPITAL_COMMUNITY): Payer: Self-pay

## 2024-01-06 MED ORDER — MOUNJARO 7.5 MG/0.5ML ~~LOC~~ SOAJ
7.5000 mg | SUBCUTANEOUS | 0 refills | Status: AC
  Filled 2024-01-06: qty 2, 28d supply, fill #0

## 2024-01-07 ENCOUNTER — Other Ambulatory Visit (HOSPITAL_COMMUNITY): Payer: Self-pay

## 2024-01-26 ENCOUNTER — Other Ambulatory Visit (HOSPITAL_COMMUNITY): Payer: Self-pay

## 2024-01-26 MED ORDER — MOUNJARO 10 MG/0.5ML ~~LOC~~ SOAJ
10.0000 mg | SUBCUTANEOUS | 0 refills | Status: AC
  Filled 2024-01-26: qty 2, 28d supply, fill #0

## 2024-01-26 NOTE — Assessment & Plan Note (Signed)
 ductal carcinoma, pT2N9miM0, Stage IB, ER+/PR+/HER2-, Grade 2, Oncotype RS 19  -presented with a palpable right breast mass. Oncotype on biopsy RS of 19, low risk. -s/p right mastectomy by Dr. Dwain Sarna and tissue expander placement by Dr. Arita Miss on 12/25/20. Pathology showed: IDC with lobular features, grade 2, spanning 3.1 cm; intermediate grade DCIS; margins and lymph nodes negative except one node containing isolated tumor cells -she started tamoxifen on 10/18/20. Tolerating well with hot flashes.  -most recent left mammogram in 01/2023 was negative. Given concern for enhancement seen on MRI, biopsy was performed on 09/05/21, path was benign. -I previously discussed ovarian suppression, mainly in light of her very heavy periods. She can consider Zoladex injections or BSO. I explained these would put her into early menopause. I noted the benefit of the Zoladex is that it's reversible. I encouraged her to discuss with her PCP/GYN. She will think about it.

## 2024-01-27 ENCOUNTER — Inpatient Hospital Stay: Attending: Hematology | Admitting: Hematology

## 2024-01-27 ENCOUNTER — Inpatient Hospital Stay

## 2024-01-27 VITALS — BP 114/78 | HR 100 | Temp 98.5°F | Resp 18 | Ht 64.0 in | Wt 185.0 lb

## 2024-01-27 DIAGNOSIS — C50211 Malignant neoplasm of upper-inner quadrant of right female breast: Secondary | ICD-10-CM | POA: Insufficient documentation

## 2024-01-27 DIAGNOSIS — D649 Anemia, unspecified: Secondary | ICD-10-CM

## 2024-01-27 DIAGNOSIS — Z170 Estrogen receptor positive status [ER+]: Secondary | ICD-10-CM

## 2024-01-27 DIAGNOSIS — Z79810 Long term (current) use of selective estrogen receptor modulators (SERMs): Secondary | ICD-10-CM | POA: Insufficient documentation

## 2024-01-27 DIAGNOSIS — Z90710 Acquired absence of both cervix and uterus: Secondary | ICD-10-CM | POA: Insufficient documentation

## 2024-01-27 DIAGNOSIS — Z1721 Progesterone receptor positive status: Secondary | ICD-10-CM | POA: Insufficient documentation

## 2024-01-27 DIAGNOSIS — D500 Iron deficiency anemia secondary to blood loss (chronic): Secondary | ICD-10-CM

## 2024-01-27 DIAGNOSIS — Z1732 Human epidermal growth factor receptor 2 negative status: Secondary | ICD-10-CM | POA: Insufficient documentation

## 2024-01-27 LAB — CBC WITH DIFFERENTIAL (CANCER CENTER ONLY)
Abs Immature Granulocytes: 0.01 K/uL (ref 0.00–0.07)
Basophils Absolute: 0 K/uL (ref 0.0–0.1)
Basophils Relative: 0 %
Eosinophils Absolute: 0.3 K/uL (ref 0.0–0.5)
Eosinophils Relative: 4 %
HCT: 33 % — ABNORMAL LOW (ref 36.0–46.0)
Hemoglobin: 10.4 g/dL — ABNORMAL LOW (ref 12.0–15.0)
Immature Granulocytes: 0 %
Lymphocytes Relative: 20 %
Lymphs Abs: 1.5 K/uL (ref 0.7–4.0)
MCH: 25.5 pg — ABNORMAL LOW (ref 26.0–34.0)
MCHC: 31.5 g/dL (ref 30.0–36.0)
MCV: 80.9 fL (ref 80.0–100.0)
Monocytes Absolute: 0.4 K/uL (ref 0.1–1.0)
Monocytes Relative: 5 %
Neutro Abs: 5.1 K/uL (ref 1.7–7.7)
Neutrophils Relative %: 71 %
Platelet Count: 380 K/uL (ref 150–400)
RBC: 4.08 MIL/uL (ref 3.87–5.11)
RDW: 17.4 % — ABNORMAL HIGH (ref 11.5–15.5)
WBC Count: 7.2 K/uL (ref 4.0–10.5)
nRBC: 0 % (ref 0.0–0.2)

## 2024-01-27 LAB — COMPREHENSIVE METABOLIC PANEL WITH GFR
ALT: 5 U/L (ref 0–44)
AST: 10 U/L — ABNORMAL LOW (ref 15–41)
Albumin: 3.9 g/dL (ref 3.5–5.0)
Alkaline Phosphatase: 46 U/L (ref 38–126)
Anion gap: 5 (ref 5–15)
BUN: 8 mg/dL (ref 6–20)
CO2: 26 mmol/L (ref 22–32)
Calcium: 8.9 mg/dL (ref 8.9–10.3)
Chloride: 108 mmol/L (ref 98–111)
Creatinine, Ser: 0.73 mg/dL (ref 0.44–1.00)
GFR, Estimated: >60 mL/min (ref >60)
Glucose, Bld: 86 mg/dL (ref 70–99)
Potassium: 3.7 mmol/L (ref 3.5–5.1)
Sodium: 139 mmol/L (ref 135–145)
Total Bilirubin: 0.4 mg/dL (ref 0.0–1.2)
Total Protein: 7 g/dL (ref 6.5–8.1)

## 2024-01-27 LAB — SAMPLE TO BLOOD BANK

## 2024-01-27 LAB — FERRITIN: Ferritin: 40 ng/mL (ref 11–307)

## 2024-01-27 NOTE — Progress Notes (Signed)
 [facility]   Telephone:([phone]) [phone] Fax:([phone]) [phone]   Clinic Follow up Note   Patient Care Team: [name], FNP as PCP - General (Family Medicine) [name], RN as Oncology Nurse Navigator [name], MD as Consulting Physician (General Surgery) [name], MD as Consulting Physician (Hematology) Shannon Agent, MD as Consulting Physician (Radiation Oncology)  Date of Service:  01/27/2024  CHIEF COMPLAINT: f/u of breast cancer and anemia  CURRENT THERAPY:  Resting  Oncology History   Malignant neoplasm of upper-inner quadrant of right breast in female, estrogen receptor positive (HCC)  ductal carcinoma, [id number], Stage IB, ER+/PR+/HER2-, Grade 2, Oncotype RS 19  -presented with a palpable right breast mass. Oncotype on biopsy RS of 19, low risk. -s/p right mastectomy by Dr. Ebbie and tissue expander placement by Dr. Elisabeth on 12/25/20. Pathology showed: IDC with lobular features, grade 2, spanning 3.1 cm; intermediate grade DCIS; margins and lymph nodes negative except one node containing isolated tumor cells -she started tamoxifen  on 10/18/20. Tolerating well with hot flashes.  -most recent left mammogram in 01/2023 was negative. Given concern for enhancement seen on MRI, biopsy was performed on 09/05/21, path was benign. -I previously discussed ovarian suppression, mainly in light of her very heavy periods. She can consider Zoladex injections or BSO. I explained these would put her into early menopause. I noted the benefit of the Zoladex is that it's reversible. I encouraged her to discuss with her PCP/GYN. She will think about it.   Assessment & Plan Malignant neoplasm of right breast, post-mastectomy, on tamoxifen  Breast cancer diagnosed in 2022, post-mastectomy with implant. Oncotype was low risk, but small amount of tumor cells were found in one lymph node. Currently on tamoxifen  due to non-removal of ovaries and premenopausal status.  Discussed the option of a blood test to detect early recurrence of breast cancer, which is similar to Oncotype and covered by most insurances. Routine imaging not performed; rely on symptoms and blood test for early detection. - Continue tamoxifen  - Perform blood test for early recurrence of breast cancer at next visit - Schedule follow-up every six months for five years, then annually -Dense breast tissue categorized as category C. Discussed future use of contrast-enhanced mammogram to increase sensitivity and improve imaging. She consented to this approach for next year. - Recommend contrast-enhanced mammogram next year  Anemia, status post hysterectomy Anemia likely improved following hysterectomy in August. No more bleeding reported and recovery from surgery is progressing well.  Plan - She is clinically doing well, I informed her lab results after her clinic visit, which showed a hemoglobin 10.4 - Continue tamoxifen . - Lab and follow-up in 6 months with NP. - I recommend contrast-enhanced mammogram next year.   SUMMARY OF ONCOLOGIC HISTORY: Oncology History Overview Note  Cancer Staging Malignant neoplasm of upper-inner quadrant of right breast in female, estrogen receptor positive (HCC) Staging form: Breast, AJCC 8th Edition - Clinical stage from 10/06/2020: Stage IB (cT2, cN0, cM0, G2, ER+, PR+, HER2-) - Signed by [name], MD on 10/17/2020 Stage prefix: Initial diagnosis Histologic grading system: 3 grade system - Pathologic stage from 12/25/2020: No Stage Recommended (ypT2, pN48mi, cM0, G2, ER+, PR+, HER2-) - Signed by [name], MD on 01/22/2021 Stage prefix: Post-therapy Histologic grading system: 3 grade system Residual tumor (R): R0 - None    Malignant neoplasm of upper-inner quadrant of right breast in female, estrogen receptor positive (HCC)  10/06/2020 Mammogram   DIGITAL DIAGNOSTIC BILATERAL MAMMOGRAM WITH TOMOSYNTHESIS AND CAD;  ULTRASOUND RIGHT BREAST  LIMITED  IMPRESSION: Two contiguous hypoechoic masses in the right breast- an irregular hypoechoic mass in the right breast at 12:30 3 cm from the nipple measuring 2.5 x 2.1 x 3.0 cm, and a hypoechoic heterogeneous mass at 12:30 2 cm from the nipple measuring 3.0 x 1.6 x 2.8 cm. Sonographic evaluation of the right axilla does not show any enlarged adenopathy.   10/06/2020 Pathology Results   Diagnosis Breast, right, needle core biopsy - INVASIVE MAMMARY CARCINOMA WITH LOBULAR FEATURES - SEE COMMENT Microscopic Comment The biopsy material shows an infiltrative proliferation of cells with large vesicular nuclei with inconspicuous nucleoli, arranged linearly and in small clusters. Based on the biopsy, the carcinoma appears Nottingham grade 2 of 3 and measures 0.9 cm in greatest linear extent  E-cadherin is POSITIVE supporting a ductal origin.  PROGNOSTIC INDICATORS Results: IMMUNOHISTOCHEMICAL AND MORPHOMETRIC ANALYSIS PERFORMED MANUALLY The tumor cells are NEGATIVE for Her2 (1+). Estrogen Receptor: 80%, POSITIVE, STRONG STAINING INTENSITY Progesterone Receptor: 90%, POSITIVE, STRONG STAINING INTENSITY Proliferation Marker Ki67: 10%   10/06/2020 Cancer Staging   Staging form: Breast, AJCC 8th Edition - Clinical stage from 10/06/2020: Stage IB (cT2, cN0, cM0, G2, ER+, PR+, HER2-) - Signed by [name], MD on 10/17/2020 Stage prefix: Initial diagnosis Histologic grading system: 3 grade system   10/06/2020 Oncotype testing   RS of 19, distant recurrence risk of 6%, chemotherapy not recommended   10/13/2020 Initial Diagnosis   Malignant neoplasm of upper-inner quadrant of right breast in female, estrogen receptor positive (HCC)   10/24/2020 Genetic Testing   Negative hereditary cancer genetic testing: no pathogenic variants detected in [facility] BRCAPlus Panel and [employer] CancerNext-Expanded +RNAinsight.  Variants of uncertain significance detected in FH at p.Y2H (c.4T>C) and SUFU at  p.A138T  (c.412G>A). The report dates are October 24, 2020 and October 27, 2020, respectively.    FH p.Y2H (c.4T>C) variant of uncertain significance was reclassified to likely benign. Report date is 03/15/2021. SUFU p.A138T (c.412G>A) variant of uncertain significance was reclassified to benign. Report date is 10/05/2021.  The BRCAplus panel offered by [employer] and includes sequencing and deletion/duplication analysis for the following 8 genes: ATM, BRCA1, BRCA2, CDH1, CHEK2, PALB2, PTEN, and TP53.  The CancerNext-Expanded gene panel offered by [facility] and includes sequencing, rearrangement, and RNA analysis for the following 77 genes: AIP, ALK, APC, ATM, AXIN2, BAP1, BARD1, BLM, BMPR1A, BRCA1, BRCA2, BRIP1, CDC73, CDH1, CDK4, CDKN1B, CDKN2A, CHEK2, CTNNA1, DICER1, FANCC, FH, FLCN, GALNT12, KIF1B, LZTR1, MAX, MEN1, MET, MLH1, MSH2, MSH3, MSH6, MUTYH, NBN, NF1, NF2, NTHL1, PALB2, PHOX2B, PMS2, POT1, PRKAR1A, PTCH1, PTEN, RAD51C, RAD51D, RB1, RECQL, RET, SDHA, SDHAF2, SDHB, SDHC, SDHD, SMAD4, SMARCA4, SMARCB1, SMARCE1, STK11, SUFU, TMEM127, TP53, TSC1, TSC2, VHL and XRCC2 (sequencing and deletion/duplication); EGFR, EGLN1, HOXB13, KIT, MITF, PDGFRA, POLD1, and POLE (sequencing only); EPCAM and GREM1 (deletion/duplication only).     11/09/2020 Imaging   Breast MRI  IMPRESSION: 1. Known biopsy-proven invasive carcinoma within the upper inner quadrant of the RIGHT breast, measuring 3.6 cm, containing a biopsy clip. Contiguous linear non-mass enhancement extends superior-lateral to the spiculated mass, increasing overall craniocaudal dimension to 3 cm. 2. Additional irregular enhancing mass within the inner RIGHT breast, 3 o'clock axis region, at anterior depth, measuring 7 mm, with suspicious washout kinetics. MRI-guided biopsy is recommended to exclude multifocal disease. 3. Additional linear clumped non-mass enhancement within the upper inner quadrant of the LEFT breast, at posterior depth, measuring  1.8 cm extent, with suspicious washout kinetics. This may represent an  intramammary lymph node. MRI-guided biopsy is recommended to exclude contralateral disease.   12/25/2020 Cancer Staging   Staging form: Breast, AJCC 8th Edition - Pathologic stage from 12/25/2020: No Stage Recommended (ypT2, pN41mi, cM0, G2, ER+, PR+, HER2-) - Signed by [name], MD on 01/22/2021 Stage prefix: Post-therapy Histologic grading system: 3 grade system Residual tumor (R): R0 - None   12/25/2020 Definitive Surgery   FINAL MICROSCOPIC DIAGNOSIS:   A. BREAST, RIGHT, MASTECTOMY:  - Invasive ductal carcinoma with lobular features, grade 2, spanning 3.1 cm.  - Intermediate grade ductal carcinoma in situ.  - Margins are negative for carcinoma.  - Lymphovascular invasion.  - Biopsy site.  - Fibroadenomatoid change.  - Pseudoangiomatous stromal hyperplasia.  - Intraductal papilloma.  - See oncology table.   B. LYMPH NODE, RIGHT AXILLARY, SENTINEL EXCISION:  - Isolated tumor cells in one of one lymph node (0/1).   C. LYMPH NODE, RIGHT AXILLARY, SENTINEL EXCISION:  - One of one lymph nodes negative for carcinoma. (0/1).   04/19/2021 Survivorship   SCP delivered by Lacie Burton, NP      Discussed the use of AI scribe software for clinical note transcription with the patient, who gave verbal consent to proceed.  History of Present Illness [facility] is a 48 year old female with breast cancer and anemia who presents for follow-up.  Diagnosed with breast cancer in 2022, characterized by a low-risk oncotype and small tumor cells in one lymph node. Underwent mastectomy with implant placement. Experiences numbness post-surgery but no pain or numbness in the breast area. Mammogram in June showed dense breast tissue, categorized as C.  Currently on tamoxifen , as her ovaries were not removed during her hysterectomy. Unsure of menopausal status but had heavy periods prior to surgery.  Anemia improved  following hysterectomy in August. Feels better with no more bleeding. Expected to complete the six-week recovery period by November 5th. No back pain.     All other systems were reviewed with the patient and are negative.  MEDICAL HISTORY:  Past Medical History:  Diagnosis Date   Endometrial thickening on ultrasound    Environmental and seasonal allergies    Family history of prostate cancer 10/18/2020   GAD (generalized anxiety disorder)    History of subarachnoid hemorrhage 07/02/2022   admissione w/ prolonged headache with N/V;  dx SAH;  s/p normal diagnostic cerebral angiogram 07-03-2022, unknown etiology;   head CTA 07-08-2023  resolution of SAH prior to discharge   Iron  deficiency anemia due to chronic blood loss    followed by dr lanny (hematology);   treated w/ transfusions & Iron  infusions;  last PRBCs x2 11-05-2023;  Iron  infusions x2 09/ 2025;  x4 08/ 2025;  x1 03/ 2025;  x3 03/ 2025;   multiple iron  infusions in 2024   Malignant neoplasm of upper-inner quadrant of right breast in female, estrogen receptor positive (HCC) 09/2020   oncologist-- dr Kenneith Stief/  surgeon-- dr ebbie;  dx 07/ 2022, Stage 1B G2, IDC w/ Lobular features/ DCIS, ER/PR+;  09/ 2022  s/p right mastectomy w/ node dissection & left mastopexy w/ reconstruction;  started tamoxifen ;   bengin breast bx 09-05-2021   MDD (major depressive disorder)    Menorrhagia with regular cycle    Migraine with aura and with status migrainosus, not intractable    Pre-diabetes    Symptomatic anemia    for HG 5.7 on 11-04-2023,  transfused x2  PRBCs on 11-05-2023   Uterine leiomyoma  Wears glasses    Wears partial dentures    upper onlty    SURGICAL HISTORY: Past Surgical History:  Procedure Laterality Date   BREAST RECONSTRUCTION WITH PLACEMENT OF TISSUE EXPANDER AND FLEX HD (ACELLULAR HYDRATED DERMIS) Right 12/25/2020   Procedure: RIGHT BREAST RECONSTRUCTION WITH PLACEMENT OF TISSUE EXPANDER AND FLEX HD (ACELLULAR  HYDRATED DERMIS);  Surgeon: Elisabeth Craig RAMAN, MD;  Location: [facility] OR;  Service: Plastics;  Laterality: Right;   HYSTERECTOMY ABDOMINAL WITH SALPINGECTOMY N/A 12/24/2023   Procedure: HYSTERECTOMY, TOTAL, ABDOMINAL, WITH BILATERAL SALPINGECTOMY;  Surgeon: Rosalva Sawyer, MD;  Location: [facility] OR;  Service: Gynecology;  Laterality: N/A;   IR ANGIO EXTERNAL CAROTID SEL EXT CAROTID BILAT MOD SED  07/03/2022   IR ANGIO INTRA EXTRACRAN SEL INTERNAL CAROTID BILAT MOD SED  07/03/2022   IR ANGIO VERTEBRAL SEL VERTEBRAL UNI L MOD SED  07/03/2022   LIPOSUCTION WITH LIPOFILLING Right 12/11/2021   Procedure: Fat grafting to right breast from abdomen;  Surgeon: Marene Sieving, MD;  Location: [facility];  Service: Plastics;  Laterality: Right;   MASTECTOMY W/ SENTINEL NODE BIOPSY Right 12/25/2020   Procedure: RIGHT MASTECTOMY WITH RIGHT AXILLARY SENTINEL LYMPH NODE BIOPSY;  Surgeon: [name], MD;  Location: [facility] OR;  Service: General;  Laterality: Right;   MASTOPEXY Left 03/12/2021   Procedure: LEFT MASTOPEXY;  Surgeon: Elisabeth Craig RAMAN, MD;  Location: [facility] OR;  Service: Plastics;  Laterality: Left;   RADIOLOGY WITH ANESTHESIA N/A 07/03/2022   Procedure: IR WITH ANESTHESIA;  Surgeon: Lanis Pupa, MD;  Location: [facility] OR;  Service: Radiology;  Laterality: N/A;   REMOVAL OF BILATERAL TISSUE EXPANDERS WITH PLACEMENT OF BILATERAL BREAST IMPLANTS Right 03/12/2021   Procedure: REMOVAL OF RIGHT TISSUE EXPANDERS WITH PLACEMENT OF RIGHT  BREAST IMPLANTS;  Surgeon: Elisabeth Craig RAMAN, MD;  Location: [facility] OR;  Service: Plastics;  Laterality: Right;   TUBAL LIGATION Bilateral 2003   PPTL    I have reviewed the social history and family history with the patient and they are unchanged from previous note.  ALLERGIES:  is allergic to tomato.  MEDICATIONS:  Current Outpatient Medications  Medication Sig Dispense Refill   ACCRUFER  30 MG CAPS TAKE 1 CAPSULE (30 MG TOTAL) BY MOUTH DAILY AT 2 PM. (Patient taking  differently: Take 30 mg by mouth daily at 2 PM.) 30 capsule 5   acetaminophen  (TYLENOL ) 500 MG tablet Take 2 tablets (1,000 mg total) by mouth every 8 (eight) hours as needed for mild pain (pain score 1-3). 30 tablet 0   busPIRone  (BUSPAR ) 10 MG tablet Take 10 mg by mouth 2 (two) times daily. Pt stated prescribed BID however takes am dose and pm dose as needed     cyanocobalamin (VITAMIN B12) 1000 MCG tablet Take 1,000 mcg by mouth daily.     EMGALITY  120 MG/ML SOAJ Inject 1 mL into the skin every 30 (thirty) days.     escitalopram  (LEXAPRO ) 10 MG tablet Take 10 mg by mouth daily.     ibuprofen  (ADVIL ) 600 MG tablet Take 1 tablet (600 mg total) by mouth every 6 (six) hours as needed for mild pain (pain score 1-3). 30 tablet 1   oxyCODONE  (OXY IR/ROXICODONE ) 5 MG immediate release tablet Take 1-2 tablets (5-10 mg total) by mouth every 6 (six) hours as needed for moderate pain (pain score 4-6). 20 tablet 0   SUMAtriptan  (IMITREX ) 50 MG tablet Take 50 mg by mouth every 2 (two) hours as needed for migraine.     tamoxifen  (  NOLVADEX ) 20 MG tablet TAKE 1 TABLET BY MOUTH EVERY DAY 90 tablet 2   tirzepatide (MOUNJARO) 10 MG/0.5ML Pen Inject 10 mg into the skin once a week. 2 mL 0   tirzepatide (MOUNJARO) 7.5 MG/0.5ML Pen Inject 7.5 mg into the skin once a week. Please note Mounjaro has been prescribed as Zepbound is no longer covered under this patient's plan for any diagnosis. 2 mL 0   topiramate (TOPAMAX) 25 MG tablet Take 25 mg by mouth 2 (two) times daily.     Vitamin D , Ergocalciferol , (DRISDOL ) 1.25 MG (50000 UNIT) CAPS capsule Take 50,000 Units by mouth once a week. Monday's     WEGOVY  2.4 MG/0.75ML SOAJ SQ injection Inject 2.4 mg into the skin once a week. (Patient taking differently: Inject 2.4 mg into the skin once a week. Friday's) 3 mL 0   WEGOVY  2.4 MG/0.75ML SOAJ SQ injection Inject 2.4 mg into the skin once a week. (Patient not taking: Reported on 12/18/2023) 3 mL 0   No current  facility-administered medications for this visit.   Facility-Administered Medications Ordered in Other Visits  Medication Dose Route Frequency Provider Last Rate Last Admin   acetaminophen  (TYLENOL ) tablet 650 mg  650 mg Oral Once [name], MD       diphenhydrAMINE  (BENADRYL ) capsule 25 mg  25 mg Oral Once [name], MD       iron  sucrose (VENOFER ) injection 200 mg  200 mg Intravenous Once [name], MD        PHYSICAL EXAMINATION: ECOG PERFORMANCE STATUS: 0 - Asymptomatic  Vitals:   01/27/24 0903  BP: 114/78  Pulse: 100  Resp: 18  Temp: 98.5 F (36.9 C)  SpO2: 94%   Wt Readings from Last 3 Encounters:  01/27/24 185 lb (83.9 kg)  12/24/23 187 lb (84.8 kg)  12/03/23 184 lb 6.4 oz (83.6 kg)     GENERAL:alert, no distress and comfortable SKIN: skin color, texture, turgor are normal, no rashes or significant lesions EYES: normal, Conjunctiva are pink and non-injected, sclera clear NECK: supple, thyroid normal size, non-tender, without nodularity LYMPH:  no palpable lymphadenopathy in the cervical, axillary  LUNGS: clear to auscultation and percussion with normal breathing effort HEART: regular rate & rhythm and no murmurs and no lower extremity edema ABDOMEN:abdomen soft, non-tender and normal bowel sounds Musculoskeletal:no cyanosis of digits and no clubbing  NEURO: alert & oriented x 3 with fluent speech, no focal motor/sensory deficits BREAST: Breasts normal on inspection. Breast incision site without masses or abnormalities. Physical Exam   LABORATORY DATA:  I have reviewed the data as listed    Latest Ref Rng & Units 01/27/2024    8:37 AM 12/25/2023    4:22 AM 12/22/2023   10:13 AM  CBC  WBC 4.0 - 10.5 K/uL 7.2  11.0  6.1   Hemoglobin 12.0 - 15.0 g/dL 89.5  8.4  9.7   Hematocrit 36.0 - 46.0 % 33.0  27.0  32.1   Platelets 150 - 400 K/uL 380  396  456         Latest Ref Rng & Units 01/27/2024    8:37 AM 12/02/2023    7:53 AM 11/04/2023    2:13 PM  CMP  Glucose  70 - 99 mg/dL 86  87  95   BUN 6 - 20 mg/dL 8  7  9    Creatinine 0.44 - 1.00 mg/dL 9.26  9.30  9.21   Sodium 135 - 145 mmol/L 139  139  141  Potassium 3.5 - 5.1 mmol/L 3.7  3.9  3.3   Chloride 98 - 111 mmol/L 108  110  112   CO2 22 - 32 mmol/L 26  22  25    Calcium 8.9 - 10.3 mg/dL 8.9  8.6  8.3   Total Protein 6.5 - 8.1 g/dL 7.0  6.6  6.5   Total Bilirubin 0.0 - 1.2 mg/dL 0.4  0.9  0.4   Alkaline Phos 38 - 126 U/L 46  35  38   AST 15 - 41 U/L 10  11  11    ALT 0 - 44 U/L 5  5  5        RADIOGRAPHIC STUDIES: I have personally reviewed the radiological images as listed and agreed with the findings in the report. No results found.    No orders of the defined types were placed in this encounter.  All questions were answered. The patient knows to call the clinic with any problems, questions or concerns. No barriers to learning was detected. The total time spent in the appointment was 25 minutes, including review of chart and various tests results, discussions about plan of care and coordination of care plan     Onita Mattock, MD 01/27/2024

## 2024-01-30 ENCOUNTER — Other Ambulatory Visit: Payer: Self-pay

## 2024-02-04 ENCOUNTER — Other Ambulatory Visit: Payer: Self-pay

## 2024-02-04 DIAGNOSIS — Z1231 Encounter for screening mammogram for malignant neoplasm of breast: Secondary | ICD-10-CM

## 2024-02-04 DIAGNOSIS — Z170 Estrogen receptor positive status [ER+]: Secondary | ICD-10-CM

## 2024-02-04 NOTE — Progress Notes (Signed)
 Verbal order w/readback from Dr. Lanny for Contrast Enhanced Bilateral Mammogram for [facility] by 09/11/2024.  Ordered placed and [facility]ulers (Crissie & Heather) notified of the order.

## 2024-03-01 ENCOUNTER — Other Ambulatory Visit (HOSPITAL_COMMUNITY): Payer: Self-pay

## 2024-03-01 MED ORDER — MOUNJARO 12.5 MG/0.5ML ~~LOC~~ SOAJ
12.5000 mg | SUBCUTANEOUS | 0 refills | Status: AC
  Filled 2024-03-01: qty 2, 28d supply, fill #0

## 2024-03-30 ENCOUNTER — Other Ambulatory Visit (HOSPITAL_COMMUNITY): Payer: Self-pay

## 2024-03-30 MED ORDER — MOUNJARO 15 MG/0.5ML ~~LOC~~ SOAJ
15.0000 mg | SUBCUTANEOUS | 0 refills | Status: DC
  Filled 2024-03-30: qty 2, 28d supply, fill #0

## 2024-05-05 ENCOUNTER — Other Ambulatory Visit (HOSPITAL_COMMUNITY): Payer: Self-pay

## 2024-05-05 MED ORDER — MOUNJARO 15 MG/0.5ML ~~LOC~~ SOAJ
15.0000 mg | SUBCUTANEOUS | 0 refills | Status: AC
  Filled 2024-05-05: qty 2, 28d supply, fill #0

## 2024-07-26 ENCOUNTER — Inpatient Hospital Stay

## 2024-07-26 ENCOUNTER — Inpatient Hospital Stay: Admitting: Nurse Practitioner
